# Patient Record
Sex: Male | Born: 1935 | Race: White | Hispanic: No | Marital: Married | Smoking: Never smoker
Health system: Southern US, Community
[De-identification: ages and names within clinical notes are randomized; demographics above are authoritative.]

## PROBLEM LIST (undated history)

## (undated) DIAGNOSIS — S72001A Fracture of unspecified part of neck of right femur, initial encounter for closed fracture: Secondary | ICD-10-CM

## (undated) DIAGNOSIS — M519 Unspecified thoracic, thoracolumbar and lumbosacral intervertebral disc disorder: Secondary | ICD-10-CM

## (undated) DIAGNOSIS — R55 Syncope and collapse: Secondary | ICD-10-CM

## (undated) DIAGNOSIS — E785 Hyperlipidemia, unspecified: Secondary | ICD-10-CM

## (undated) DIAGNOSIS — M1990 Unspecified osteoarthritis, unspecified site: Secondary | ICD-10-CM

## (undated) DIAGNOSIS — I4821 Permanent atrial fibrillation: Secondary | ICD-10-CM

## (undated) DIAGNOSIS — I119 Hypertensive heart disease without heart failure: Secondary | ICD-10-CM

## (undated) HISTORY — PX: TOTAL HIP ARTHROPLASTY: SHX124

## (undated) HISTORY — DX: Permanent atrial fibrillation: I48.21

## (undated) HISTORY — PX: ANKLE SURGERY: SHX546

## (undated) HISTORY — DX: Unspecified osteoarthritis, unspecified site: M19.90

## (undated) HISTORY — DX: Hyperlipidemia, unspecified: E78.5

## (undated) HISTORY — DX: Unspecified thoracic, thoracolumbar and lumbosacral intervertebral disc disorder: M51.9

---

## 2000-03-04 ENCOUNTER — Encounter (INDEPENDENT_AMBULATORY_CARE_PROVIDER_SITE_OTHER): Payer: Self-pay | Admitting: Specialist

## 2000-03-04 ENCOUNTER — Ambulatory Visit (HOSPITAL_COMMUNITY): Admission: RE | Admit: 2000-03-04 | Discharge: 2000-03-04 | Payer: Self-pay | Admitting: Gastroenterology

## 2003-10-03 ENCOUNTER — Ambulatory Visit (HOSPITAL_COMMUNITY): Admission: RE | Admit: 2003-10-03 | Discharge: 2003-10-03 | Payer: Self-pay | Admitting: Gastroenterology

## 2005-10-29 ENCOUNTER — Encounter: Payer: Self-pay | Admitting: Internal Medicine

## 2005-10-29 ENCOUNTER — Encounter: Admission: RE | Admit: 2005-10-29 | Discharge: 2005-10-29 | Payer: Self-pay | Admitting: Family Medicine

## 2006-08-27 ENCOUNTER — Encounter: Admission: RE | Admit: 2006-08-27 | Discharge: 2006-08-27 | Payer: Self-pay | Admitting: Family Medicine

## 2006-09-14 ENCOUNTER — Encounter: Admission: RE | Admit: 2006-09-14 | Discharge: 2006-09-14 | Payer: Self-pay | Admitting: Family Medicine

## 2007-02-25 ENCOUNTER — Encounter: Admission: RE | Admit: 2007-02-25 | Discharge: 2007-02-25 | Payer: Self-pay | Admitting: Family Medicine

## 2007-03-22 ENCOUNTER — Encounter: Admission: RE | Admit: 2007-03-22 | Discharge: 2007-03-22 | Payer: Self-pay | Admitting: Family Medicine

## 2007-05-26 ENCOUNTER — Ambulatory Visit: Payer: Self-pay | Admitting: Internal Medicine

## 2007-05-26 DIAGNOSIS — I10 Essential (primary) hypertension: Secondary | ICD-10-CM

## 2007-08-09 ENCOUNTER — Ambulatory Visit: Payer: Self-pay | Admitting: Internal Medicine

## 2007-08-09 DIAGNOSIS — M51379 Other intervertebral disc degeneration, lumbosacral region without mention of lumbar back pain or lower extremity pain: Secondary | ICD-10-CM | POA: Insufficient documentation

## 2007-08-09 DIAGNOSIS — M5137 Other intervertebral disc degeneration, lumbosacral region: Secondary | ICD-10-CM

## 2007-08-18 ENCOUNTER — Encounter: Admission: RE | Admit: 2007-08-18 | Discharge: 2007-10-12 | Payer: Self-pay | Admitting: Internal Medicine

## 2007-08-18 ENCOUNTER — Encounter: Payer: Self-pay | Admitting: Internal Medicine

## 2007-09-15 ENCOUNTER — Ambulatory Visit: Payer: Self-pay | Admitting: Internal Medicine

## 2007-10-12 ENCOUNTER — Encounter: Payer: Self-pay | Admitting: Internal Medicine

## 2007-11-24 ENCOUNTER — Ambulatory Visit: Payer: Self-pay | Admitting: Internal Medicine

## 2007-11-26 LAB — CONVERTED CEMR LAB
ALT: 33 units/L (ref 0–53)
BUN: 14 mg/dL (ref 6–23)
Basophils Absolute: 0.1 10*3/uL (ref 0.0–0.1)
Chloride: 106 meq/L (ref 96–112)
Cholesterol: 153 mg/dL (ref 0–200)
Creatinine, Ser: 0.8 mg/dL (ref 0.4–1.5)
Eosinophils Absolute: 0.2 10*3/uL (ref 0.0–0.7)
Eosinophils Relative: 2.5 % (ref 0.0–5.0)
Glucose, Bld: 134 mg/dL — ABNORMAL HIGH (ref 70–99)
HCT: 39.7 % (ref 39.0–52.0)
HDL: 36.7 mg/dL — ABNORMAL LOW (ref >39.0)
MCV: 92.9 fL (ref 78.0–100.0)
Monocytes Relative: 11.4 % (ref 3.0–12.0)
Potassium: 4.3 meq/L (ref 3.5–5.1)
RBC: 4.27 M/uL (ref 4.22–5.81)
TSH: 3.45 microintl units/mL (ref 0.35–5.50)
Total Bilirubin: 0.9 mg/dL (ref 0.3–1.2)
Total CHOL/HDL Ratio: 4.2
Total Protein: 7.2 g/dL (ref 6.0–8.3)
Triglycerides: 136 mg/dL (ref 0–149)
VLDL: 27 mg/dL (ref 0–40)
WBC: 9 10*3/uL (ref 4.5–10.5)

## 2008-01-06 ENCOUNTER — Encounter: Admission: RE | Admit: 2008-01-06 | Discharge: 2008-01-06 | Payer: Self-pay | Admitting: Neurosurgery

## 2008-01-14 ENCOUNTER — Encounter: Admission: RE | Admit: 2008-01-14 | Discharge: 2008-01-14 | Payer: Self-pay | Admitting: Neurosurgery

## 2008-01-19 ENCOUNTER — Encounter: Payer: Self-pay | Admitting: Internal Medicine

## 2008-01-19 ENCOUNTER — Telehealth: Payer: Self-pay | Admitting: Internal Medicine

## 2008-01-20 ENCOUNTER — Ambulatory Visit: Payer: Self-pay | Admitting: Internal Medicine

## 2008-01-20 DIAGNOSIS — M159 Polyosteoarthritis, unspecified: Secondary | ICD-10-CM

## 2008-01-27 ENCOUNTER — Ambulatory Visit: Payer: Self-pay

## 2008-01-27 ENCOUNTER — Encounter: Payer: Self-pay | Admitting: Internal Medicine

## 2008-01-28 ENCOUNTER — Telehealth (INDEPENDENT_AMBULATORY_CARE_PROVIDER_SITE_OTHER): Payer: Self-pay | Admitting: *Deleted

## 2008-02-01 ENCOUNTER — Inpatient Hospital Stay (HOSPITAL_COMMUNITY): Admission: RE | Admit: 2008-02-01 | Discharge: 2008-02-04 | Payer: Self-pay | Admitting: Orthopedic Surgery

## 2008-02-09 ENCOUNTER — Encounter: Payer: Self-pay | Admitting: Internal Medicine

## 2008-02-09 ENCOUNTER — Emergency Department (HOSPITAL_COMMUNITY): Admission: EM | Admit: 2008-02-09 | Discharge: 2008-02-10 | Payer: Self-pay | Admitting: Emergency Medicine

## 2008-02-10 ENCOUNTER — Telehealth: Payer: Self-pay | Admitting: Internal Medicine

## 2008-02-11 ENCOUNTER — Telehealth: Payer: Self-pay | Admitting: Internal Medicine

## 2008-02-11 ENCOUNTER — Encounter: Payer: Self-pay | Admitting: Internal Medicine

## 2008-02-23 ENCOUNTER — Encounter: Payer: Self-pay | Admitting: Internal Medicine

## 2008-04-05 ENCOUNTER — Encounter: Payer: Self-pay | Admitting: Internal Medicine

## 2008-05-26 ENCOUNTER — Ambulatory Visit: Payer: Self-pay | Admitting: Internal Medicine

## 2008-05-29 LAB — CONVERTED CEMR LAB
ALT: 13 units/L (ref 0–53)
AST: 19 units/L (ref 0–37)
Alkaline Phosphatase: 88 units/L (ref 39–117)
Bilirubin, Direct: 0.2 mg/dL (ref 0.0–0.3)
CO2: 29 meq/L (ref 19–32)
Cholesterol: 144 mg/dL (ref 0–200)
GFR calc Af Amer: 105 mL/min
GFR calc non Af Amer: 87 mL/min
Glucose, Bld: 95 mg/dL (ref 70–99)
Sodium: 139 meq/L (ref 135–145)
Total CHOL/HDL Ratio: 3.1
Triglycerides: 75 mg/dL (ref 0–149)
VLDL: 15 mg/dL (ref 0–40)

## 2008-10-05 ENCOUNTER — Telehealth: Payer: Self-pay | Admitting: Internal Medicine

## 2008-11-28 ENCOUNTER — Ambulatory Visit: Payer: Self-pay | Admitting: Internal Medicine

## 2008-11-29 LAB — CONVERTED CEMR LAB
Albumin: 4 g/dL (ref 3.5–5.2)
BUN: 20 mg/dL (ref 6–23)
Basophils Absolute: 0.1 10*3/uL (ref 0.0–0.1)
Bilirubin, Direct: 0 mg/dL (ref 0.0–0.3)
CO2: 29 meq/L (ref 19–32)
Cholesterol: 136 mg/dL (ref 0–200)
Creatinine, Ser: 1 mg/dL (ref 0.4–1.5)
Eosinophils Relative: 2.7 % (ref 0.0–5.0)
Glucose, Bld: 107 mg/dL — ABNORMAL HIGH (ref 70–99)
HDL: 42.9 mg/dL (ref >39.00)
MCHC: 33.9 g/dL (ref 30.0–36.0)
Monocytes Relative: 13.9 % — ABNORMAL HIGH (ref 3.0–12.0)
Neutro Abs: 3.7 10*3/uL (ref 1.4–7.7)
Neutrophils Relative %: 56.2 % (ref 43.0–77.0)
Phosphorus: 3.8 mg/dL (ref 2.3–4.6)
Platelets: 264 10*3/uL (ref 150.0–400.0)
Potassium: 4.2 meq/L (ref 3.5–5.1)
RBC: 4.23 M/uL (ref 4.22–5.81)
Sodium: 138 meq/L (ref 135–145)
TSH: 2.71 microintl units/mL (ref 0.35–5.50)
Triglycerides: 104 mg/dL (ref 0.0–149.0)
VLDL: 20.8 mg/dL (ref 0.0–40.0)
WBC: 6.6 10*3/uL (ref 4.5–10.5)

## 2009-06-05 ENCOUNTER — Ambulatory Visit: Payer: Self-pay | Admitting: Internal Medicine

## 2009-06-05 DIAGNOSIS — I080 Rheumatic disorders of both mitral and aortic valves: Secondary | ICD-10-CM

## 2009-09-21 ENCOUNTER — Telehealth: Payer: Self-pay | Admitting: Internal Medicine

## 2009-12-24 ENCOUNTER — Ambulatory Visit: Payer: Self-pay | Admitting: Internal Medicine

## 2009-12-27 LAB — CONVERTED CEMR LAB
BUN: 19 mg/dL (ref 6–23)
Bilirubin, Direct: 0.1 mg/dL (ref 0.0–0.3)
CO2: 27 meq/L (ref 19–32)
GFR calc non Af Amer: 54.96 mL/min (ref >60)
Glucose, Bld: 93 mg/dL (ref 70–99)
HCT: 41.3 % (ref 39.0–52.0)
HDL: 57.8 mg/dL (ref >39.00)
Lymphocytes Relative: 20.7 % (ref 12.0–46.0)
MCHC: 34.2 g/dL (ref 30.0–36.0)
Monocytes Relative: 11.9 % (ref 3.0–12.0)
Neutro Abs: 6 10*3/uL (ref 1.4–7.7)
Potassium: 4.8 meq/L (ref 3.5–5.1)
RBC: 4.29 M/uL (ref 4.22–5.81)
RDW: 13.9 % (ref 11.5–14.6)
TSH: 2.77 microintl units/mL (ref 0.35–5.50)
Total Bilirubin: 0.6 mg/dL (ref 0.3–1.2)
Total Protein: 7.5 g/dL (ref 6.0–8.3)
Triglycerides: 151 mg/dL — ABNORMAL HIGH (ref 0.0–149.0)

## 2010-03-08 ENCOUNTER — Telehealth: Payer: Self-pay | Admitting: Internal Medicine

## 2010-04-23 NOTE — Assessment & Plan Note (Signed)
Summary: 6 M F/U DLO   Vital Signs:  Patient profile:   77 year old male Weight:      191 pounds BMI:     27.12 Temp:     97.9 degrees F oral Pulse rate:   58 / minute Pulse rhythm:   regular BP sitting:   142 / 80  (left arm) Cuff size:   large  Vitals Entered By: Mervin Hack CMA Duncan Dull) (December 24, 2009 3:30 PM) CC: 6 month follow-up   History of Present Illness: Doing okay No new concerns  Arthritis is occ a problem If he overdoes it, he may have some pain only rarely uses advil Occ walks and does yard work Occ has right buttock pain at the start of walking, that will then resolve over time  Occ has friend check BP (or at BP) Usually  ~140/80 No chest pain No SOB No edema  Still taking statin no muscle aches  Allergies: No Known Drug Allergies  Past History:  Past medical, surgical, family and social histories (including risk factors) reviewed for relevance to current acute and chronic problems.  Past Medical History: Reviewed history from 01/20/2008 and no changes required. Hyperlipidemia Hypertension Lumbar disk disease Osteoarthritis  Past Surgical History: Reviewed history from 05/26/2008 and no changes required. 12/09  Left THR--[place]  Family History: Reviewed history from 05/26/2007 and no changes required. Dad killed in MVA @25  Mom died @52   some type of cancer 1 sister--high chol and HTN also No CAD, DM No prostate or colon cancer  Social History: Reviewed history from 05/26/2007 and no changes required. Retired--worked for [employer] Married--2 daughters Never Smoked Alcohol use-yes  Review of Systems       Occ gets slightly unstable feeling after just getting up no falls sleeps fine in general---occ middle of the night awakening (gets 6.5-7 hours per night in general) appetite is good weight is down 5#  Physical Exam  General:  alert and normal appearance.   Neck:  supple, no masses, no thyromegaly, no carotid bruits, and no  cervical lymphadenopathy.   Lungs:  normal respiratory effort, no intercostal retractions, no accessory muscle use, and normal breath sounds.   Heart:  normal rate, regular rhythm, no murmur, and no gallop.   Msk:  no joint tenderness and no joint swelling.   Extremities:  no edema Neurologic:  alert & oriented X3, strength normal in all extremities, and gait normal.   Psych:  normally interactive, good eye contact, not anxious appearing, and not depressed appearing.     Impression & Recommendations:  Problem # 1:  HYPERTENSION (ICD-401.9) Assessment Unchanged  good control no changes needed due for labs  His updated medication list for this problem includes:    Metoprolol Tartrate 100 Mg Tabs (Metoprolol tartrate) .Marland Kitchen... Take 1 tablet by mouth once a day    Quinapril-hydrochlorothiazide 20-12.5 Mg Tabs (Quinapril-hydrochlorothiazide) .Marland Kitchen... Take 1 tablet by mouth once a day  BP today: 142/80 Prior BP: 140/80 (06/05/2009)  Labs Reviewed: K+: 4.2 (11/28/2008) Creat: : 1.0 (11/28/2008)   Chol: 136 (11/28/2008)   HDL: 42.90 (11/28/2008)   LDL: 72 (11/28/2008)   TG: 104.0 (11/28/2008)  Orders: TLB-Renal Function Panel (80069-RENAL) TLB-CBC Platelet - w/Differential (85025-CBCD) TLB-TSH (Thyroid Stimulating Hormone) (84443-TSH) Venipuncture (59563)  Problem # 2:  OSTEOARTHRITIS (ICD-715.90) Assessment: Improved not needing meds much discussed staying fit with regular  walking  Problem # 3:  HYPERLIPIDEMIA (ICD-272.4) Assessment: Unchanged  discussed philosophy of primary prevention will continue  His updated  medication list for this problem includes:    Simvastatin 20 Mg Tabs (Simvastatin) .Marland Kitchen... Take 1 tablet by mouth once a day  Orders: TLB-Lipid Panel (80061-LIPID) TLB-Hepatic/Liver Function Pnl (80076-HEPATIC)  Complete Medication List: 1)  Metoprolol Tartrate 100 Mg Tabs (Metoprolol tartrate) .... Take 1 tablet by mouth once a day 2)  Quinapril-hydrochlorothiazide  20-12.5 Mg Tabs (Quinapril-hydrochlorothiazide) .... Take 1 tablet by mouth once a day 3)  Simvastatin 20 Mg Tabs (Simvastatin) .... Take 1 tablet by mouth once a day 4)  Amoxicillin 500 Mg Caps (Amoxicillin) .... Take 4 one hour prior to dental procedures 5)  Adult Aspirin Low Strength 81 Mg Tbdp (Aspirin) .... Take 1 tablet by mouth once a day  Other Orders: Flu Vaccine 74yrs + MEDICARE PATIENTS (E4540) Administration Flu vaccine - MCR (J8119)  Patient Instructions: 1)  Please schedule a follow-up appointment in 6 months .   Current Allergies (reviewed today): No known allergies    Influenza Vaccine    Vaccine Type: Fluvax MCR    Site: left deltoid    Mfr: GlaxoSmithKline    Dose: 0.5 ml    Route: IM    Given by: Mervin Hack CMA (AAMA)    Exp. Date: 09/21/2010    Lot #: [id number]    VIS given: 10/16/09 version given December 24, 2009.  Flu Vaccine Consent Questions    Do you have a history of severe allergic reactions to this vaccine? no    Any prior history of allergic reactions to egg and/or gelatin? no    Do you have a sensitivity to the preservative Thimersol? no    Do you have a past history of Guillan-Barre Syndrome? no    Do you currently have an acute febrile illness? no    Have you ever had a severe reaction to latex? no    Vaccine information given and explained to patient? yes

## 2010-04-23 NOTE — Assessment & Plan Note (Signed)
Summary: ROA 6 MTHS CYD   Vital Signs:  Patient profile:   77 year old male Weight:      196 pounds Temp:     97.7 degrees F oral Pulse rate:   68 / minute Pulse rhythm:   regular BP sitting:   140 / 80  (left arm) Cuff size:   large  Vitals Entered By: Mervin Hack CMA Duncan Dull) (June 05, 2009 9:16 AM) CC: 56month follow-up   History of Present Illness: Doing fairly well left hip is doing very well Right hip is okay uses advil sporadically (not every day) and that helps Able to get out some in garden May have some pain from past ruptured disc as well  Occ checks blood pressure at [facility] 140s/70s NO chest pain  No SOB no sig edema  No Gi problems no myalgias on the statin   Allergies: No Known Drug Allergies  Past History:  Past medical, surgical, family and social histories (including risk factors) reviewed for relevance to current acute and chronic problems.  Past Medical History: Reviewed history from 01/20/2008 and no changes required. Hyperlipidemia Hypertension Lumbar disk disease Osteoarthritis  Past Surgical History: Reviewed history from 05/26/2008 and no changes required. 12/09  Left THR--[place]  Family History: Reviewed history from 05/26/2007 and no changes required. Dad killed in MVA @25  Mom died @52   some type of cancer 1 sister--high chol and HTN also No CAD, DM No prostate or colon cancer  Social History: Reviewed history from 05/26/2007 and no changes required. Retired--worked for [employer] Married--2 daughters Never Smoked Alcohol use-yes  Review of Systems       occ mild dizziness--mostly if gets up quickly Got recall for colonoscopy---may be reasonable to do one more. He will consider sleeps okay in general Nocturia x 1 at most weight up 10#---this is his former baseline   Physical Exam  General:  alert and normal appearance.   Neck:  supple, no masses, no thyromegaly, no carotid bruits, and no cervical lymphadenopathy.     Lungs:  normal respiratory effort and normal breath sounds.   Heart:  normal rate, regular rhythm, and no gallop.   Gr 2/6 systolic murmur at apex (MR) Abdomen:  soft, non-tender, and no masses.   Extremities:  No edema Psych:  normally interactive, good eye contact, not anxious appearing, and not depressed appearing.     Impression & Recommendations:  Problem # 1:  MITRAL REGURGITATION (ICD-396.3) Assessment New new murmur to me at least sounds very mild no cardiac symptoms will just follow  His updated medication list for this problem includes:    Metoprolol Tartrate 100 Mg Tabs (Metoprolol tartrate) .Marland Kitchen... Take 1 tablet by mouth once a day    Adult Aspirin Low Strength 81 Mg Tbdp (Aspirin) .Marland Kitchen... Take 1 tablet by mouth once a day  Problem # 2:  HYPERTENSION (ICD-401.9) Assessment: Unchanged good control  no changes needed  His updated medication list for this problem includes:    Metoprolol Tartrate 100 Mg Tabs (Metoprolol tartrate) .Marland Kitchen... Take 1 tablet by mouth once a day    Quinapril-hydrochlorothiazide 20-12.5 Mg Tabs (Quinapril-hydrochlorothiazide) .Marland Kitchen... Take 1 tablet by mouth once a day  BP today: 140/80 Prior BP: 130/60 (11/28/2008)  Labs Reviewed: K+: 4.2 (11/28/2008) Creat: : 1.0 (11/28/2008)   Chol: 136 (11/28/2008)   HDL: 42.90 (11/28/2008)   LDL: 72 (11/28/2008)   TG: 104.0 (11/28/2008)  Problem # 3:  HYPERLIPIDEMIA (ICD-272.4) Assessment: Unchanged at goal recheck labs next time  His updated  medication list for this problem includes:    Simvastatin 20 Mg Tabs (Simvastatin) .Marland Kitchen... Take 1 tablet by mouth once a day  Labs Reviewed: SGOT: 18 (11/28/2008)   SGPT: 16 (11/28/2008)   HDL:42.90 (11/28/2008), 47.0 (05/26/2008)  LDL:72 (11/28/2008), 82 (05/26/2008)  Chol:136 (11/28/2008), 144 (05/26/2008)  Trig:104.0 (11/28/2008), 75 (05/26/2008)  Problem # 4:  OSTEOARTHRITIS (ICD-715.90) Assessment: Unchanged does okay with occ advil right hip not bothering him  as much now that left THR has healed  Complete Medication List: 1)  Metoprolol Tartrate 100 Mg Tabs (Metoprolol tartrate) .... Take 1 tablet by mouth once a day 2)  Adult Aspirin Low Strength 81 Mg Tbdp (Aspirin) .... Take 1 tablet by mouth once a day 3)  Quinapril-hydrochlorothiazide 20-12.5 Mg Tabs (Quinapril-hydrochlorothiazide) .... Take 1 tablet by mouth once a day 4)  Simvastatin 20 Mg Tabs (Simvastatin) .... Take 1 tablet by mouth once a day  Patient Instructions: 1)  Please schedule a follow-up appointment in 6 months .   Current Allergies (reviewed today): No known allergies

## 2010-04-23 NOTE — Progress Notes (Signed)
Summary: Amoxicillin for dental surgery  Phone Note Call from Patient Call back at Home Phone [phone] Call back at [phone]   Caller: Patient Call For: Cindee Salt MD Summary of Call: Needs Amoxicillin for Dental Surgery, at least 8 or possibly 12.  Erick Alley Initial call taken by: Delilah Shan CMA Duncan Dull),  September 21, 2009 10:54 AM  Follow-up for Phone Call        okay 500mg   #12 x 0 Take 4 tabs about 1 hour before dental procedures Follow-up by: Cindee Salt MD,  September 21, 2009 11:06 AM    New/Updated Medications: AMOXICILLIN 500 MG CAPS (AMOXICILLIN) take 4 one hour prior to dental procedures Prescriptions: AMOXICILLIN 500 MG CAPS (AMOXICILLIN) take 4 one hour prior to dental procedures  #12 x 0   Entered by:   Lowella Petties CMA   Authorized by:   Cindee Salt MD   Signed by:   Lowella Petties CMA on 09/21/2009   Method used:   Telephoned to ...       [facility] [address] [address] [facility]       [address]       [place], [place]  [address]       Ph: [phone]       Fax: [phone]   RxID:   [id number]   Prior Medications: METOPROLOL TARTRATE 100 MG  TABS (METOPROLOL TARTRATE) Take 1 tablet by mouth once a day ADULT ASPIRIN LOW STRENGTH 81 MG  TBDP (ASPIRIN) Take 1 tablet by mouth once a day QUINAPRIL-HYDROCHLOROTHIAZIDE 20-12.5 MG  TABS (QUINAPRIL-HYDROCHLOROTHIAZIDE) Take 1 tablet by mouth once a day SIMVASTATIN 20 MG  TABS (SIMVASTATIN) Take 1 tablet by mouth once a day Current Allergies: No known allergies

## 2010-04-25 NOTE — Progress Notes (Signed)
Summary: possible mini stroke   Phone Note Call from Patient Call back at Home Phone [phone]   Caller: Patient Call For: Cindee Salt MD Summary of Call: Patient's daughter called with concerns that she has for her dad. She says that a couple of weeks ago patient fell and blacked out for about 30 seconds, he told daughter and wife that he had vertigo and that is why he fell. She says that he also had some vomitting along with the dizzines for a couple of days. They tried to take him to the hospital because with the fall he got a little banged up with some minor bruises and scratches, but he refused to go to the hospital and told them that he was fine. Daughter says that since then he is having trouble with his memory and is very confused. 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 She is very concerned that he could have had a mini stroke. Patient refuses to come in and be checked out. Daugther wants [place] to send him a letter or either call him to ask him to make a follow up appt. without him knowing that she called [place]. Are we allowed to do that? Please advise.  Initial call taken by: Melody Comas,  March 08, 2010 1:32 PM  Follow-up for Phone Call        I will forward this to Dr. Alphonsus Sias as he knows the patient.  If he is refusing to be evaluated, this can wait for Dr. Alphonsus Sias to review on Monday. Ruthe Mannan MD  March 08, 2010 1:34 PM  He is not overdue for appt Only option is to ask him to come in based on family's concerns. I can certainly stress to him that they were right to be concerned. Call daughter back first and ask if we can call him in based on her concerns. It actually sounds like he had a completed stroke, not just a mini-one Cindee Salt MD  March 09, 2010 9:13 AM    spoke with wife, she  couldn't talk because pt was sitting right near her and listening to her call, I asked her to call me back when she could  talk, but she did express that pt will not come in. DeShannon Katrinka Blazing CMA Duncan Dull)  March 11, 2010 8:36 AM    Additional Follow-up for Phone Call Additional follow up Details #1::        Pt's daughter is asking that you call her.  She is not happy with the way the previous call was handled this morning.  Daughter wants pt to come in for a follow up but she wants [place] to call him and tell him.  She says he can absolutly NOT know that she has called or that we spoke to her mother this morning. Daughter's number is [phone] Additional Follow-up by: Lowella Petties CMA, AAMA,  March 11, 2010 9:27 AM    Additional Follow-up for Phone Call Additional follow up Details #2::    Had vertigo, then dizziness after Thanksgiving--this is better now Somewhat confused still per daughter She believes he stopped one of his BP meds Reviewed the record and no meds were stopped  Okay for her to let him know she called to check which med was stopped--and none were stopped. This may be a reason to get him back in the office she will be in town for another few days and watch  him closely in any case Follow-up by: Cindee Salt MD,  March 11, 2010 1:46 PM

## 2010-05-04 ENCOUNTER — Encounter: Payer: Self-pay | Admitting: Internal Medicine

## 2010-06-24 ENCOUNTER — Ambulatory Visit: Payer: Self-pay | Admitting: Internal Medicine

## 2010-06-25 ENCOUNTER — Encounter: Payer: Self-pay | Admitting: Internal Medicine

## 2010-06-25 ENCOUNTER — Ambulatory Visit (INDEPENDENT_AMBULATORY_CARE_PROVIDER_SITE_OTHER): Payer: 59 | Admitting: Internal Medicine

## 2010-06-25 VITALS — BP 108/62 | HR 70 | Temp 97.6°F | Ht 70.5 in | Wt 188.0 lb

## 2010-06-25 DIAGNOSIS — R42 Dizziness and giddiness: Secondary | ICD-10-CM | POA: Insufficient documentation

## 2010-06-25 DIAGNOSIS — E785 Hyperlipidemia, unspecified: Secondary | ICD-10-CM

## 2010-06-25 DIAGNOSIS — I10 Essential (primary) hypertension: Secondary | ICD-10-CM

## 2010-06-25 NOTE — Progress Notes (Signed)
  Subjective:    Patient ID: Martin Conner, male    DOB: 10-18-1927, 77 y.o.   MRN: [id number]  HPI DOing okay He stopped the cholesterol medication after the last visit--we had discussed this He hasn't noticed any changes Reviewed concerns about medication without mentioning daughter's call  Still has some "dizziness" when he rolls over to left side in bed or if he looks up Early vertigo symptoms Does report fall with bad spell after he drove all the way home from the beach and fell asleep with his head back Actually passed out briefly (this is probably the event his daughter called about)  NO chest pain No SOB No edema  Review of Systems Appetite is good Weight is stable Sleeps well    Objective:   Physical Exam  Constitutional: He is oriented to person, place, and time. He appears well-developed and well-nourished. No distress.  Neck: Normal range of motion. Neck supple. No thyromegaly present.  Cardiovascular: Normal rate, regular rhythm and normal heart sounds.  Exam reveals no gallop.   No murmur heard.      freq ectopy  Pulmonary/Chest: Effort normal and breath sounds normal. No respiratory distress. He has no wheezes. He has no rales.  Musculoskeletal: He exhibits no edema and no tenderness.  Lymphadenopathy:    He has no cervical adenopathy.  Neurological: He is alert and oriented to person, place, and time.       Presidents--"Barack Phillips [place], [place]" [phone] Recall   3/3 after 3 minutes Romberg absent No nystagmus  Psychiatric: He has a normal mood and affect. His behavior is normal. Judgment and thought content normal.          Assessment & Plan:

## 2010-08-06 NOTE — Op Note (Signed)
NAME:  Martin Conner, Martin Conner                   ACCOUNT NO.:  [id number]   MEDICAL RECORD NO.:  [id number]          PATIENT TYPE:  INP   LOCATION:  5040                         FACILITY:  [facility]   PHYSICIAN:  Eulas Post, MD    DATE OF BIRTH:  09-13-1927   DATE OF PROCEDURE:  02/01/2008  DATE OF DISCHARGE:                               OPERATIVE REPORT   ATTENDING PHYSICIAN:  Eulas Post, MD   ASSISTANT:  Laural Benes. Su Hilt, PA-C   PREOPERATIVE DIAGNOSIS:  Left hip osteoarthritis.   POSTOPERATIVE DIAGNOSIS:  Left hip osteoarthritis.   OPERATIVE PROCEDURE:  Left total hip arthroplasty.   ANESTHESIA:  General.   OPERATIVE IMPLANTS:  DePuy Pinnacle Marathon acetabular liner +4 ten  degree lip by 56 mm outer diameter and 36 mm inner diameter with a  Pinnacle sector to acetabular cup size 56 and a size 8 tapered hip stem  high offset with a size +5 x 36-mm metal-on-metal femoral head and a  single apex hole eliminator for the acetabulum.   PREOPERATIVE INDICATIONS:  Mr. Martin Conner is a 77 year old man who had  end-stage osteoarthritis of the left hip.  He elected to undergo the  above named procedures.  The risks, benefits, and alternatives were  discussed with him preoperatively including but not limited to risks of  infection, bleeding, nerve injury, periprosthetic fracture, dislocation,  leg length discrepancy, blood clots, a need for revision surgery,  cardiopulmonary complications, among others and he was willing to  proceed.   OPERATIVE PROCEDURE:  The patient was brought to the operating room and  placed in supine position.  General anesthesia was administered.  An 1  gm intravenous Ancef was given.  He was turned into lateral decubitus  position and the left lower extremity was prepped and draped in the  usual sterile fashion and posterior lateral approach was performed.  Capsulotomy was carried out in a T-type fashion and the piriformis was  tagged.  The hip was dislocated after  a measurement for our reference  was taken.  We performed a femoral neck resection at the adequate  position.  We then exposed the acetabulum and reamed with the  appropriate version and inclination up to prepare for a size 56.  The  overall bone quality was somewhat poor.  Nevertheless, he had excellent  fit with the trial and the acetabular component was seated flush against  the bone with excellent apposition and stability.  Therefore no screws  were placed.   We placed a lipped liner as the trial and then exposed the proximal  femur.  We had to lateralize fairly aggressively given his native  anatomy.  Therefore we used a high offset.  The bone quality overall was  mediocre.  We had lateralizing reamers and sequentially broached up to a  size 8.  This provided excellent fit.  Therefore, we placed the above  named trial broach and then reduced hip with first a standard neck and a  size 1.5 femoral head, but this was not found to restore appropriate leg  length, and the  high offset with the +5 did a better job.  Additionally,  the stability was excellent with this configuration.  Therefore, the  wounds were irrigated copiously and the real femoral stem was placed.  We trialed one more time and selected the above named components and  then impacted these into place.  We had already placed the polyethylene  component with the 10-degree lipped liner at the 2:30 position.  The  wounds were irrigated copiously and the hip reduced and it was found to  have excellent stability.  Therefore, we repaired the capsule and the  Piriformis through drill holes in the proximal greater tuberosity using  the smallest drill bit available.  Excellent soft tissue sleeve was  achieved.  We then irrigated the wounds again and closed the fascia with  Vicryl followed by Vicryl for the subcutaneous tissue and a 4-0 Monocryl  for the skin.  Steri-Strips were placed followed by Sterile gauze and an  abduction  brace.  The patient was awakened and returned to the PACU in  stable and satisfactory condition.  There were no complications.  The  patient tolerated the procedure well.      Eulas Post, MD  Electronically Signed     JPL/MEDQ  D:  02/01/2008  T:  02/02/2008  Job:  [id number]

## 2010-08-09 NOTE — Discharge Summary (Signed)
NAME:  Martin Conner, Martin Conner                   ACCOUNT NO.:  [id number]   MEDICAL RECORD NO.:  [id number]          PATIENT TYPE:  INP   LOCATION:  5040                         FACILITY:  [facility]   PHYSICIAN:  Eulas Post, MD    DATE OF BIRTH:  July 11, 1927   DATE OF ADMISSION:  02/01/2008  DATE OF DISCHARGE:  02/04/2008                               DISCHARGE SUMMARY   ADMISSION DIAGNOSIS:  Left hip osteoarthritis.   DISCHARGE DIAGNOSES:  1. Left hip osteoarthritis.  2. Urinary tract infection.   Discharge medications include Coumadin as well as ciprofloxacin and  Percocet and Valium as needed.   HOSPITAL COURSE:  Mr. Martin Conner is a 77 year old man who elected to  undergo left total hip arthroplasty.  He did well postoperatively and  was given Coumadin and sequential compression devices for DVT  prophylaxis.  He was making good progress with physical therapy and was  planned to be discharged home with followup with me in approximately 2  weeks.  His wounds were clean, dry, and intact, and his dressings were  changed prior discharge.  His pain was controlled on p.o. analgesics.  He was given Coumadin for DVT prophylaxis.  He was given Ancef for  perioperative antimicrobial prophylaxis.  He did well throughout his  hospital stay and benefited maximally.  There were no complications.      Eulas Post, MD  Electronically Signed     JPL/MEDQ  D:  03/06/2008  T:  03/07/2008  Job:  [id number]

## 2010-08-09 NOTE — Op Note (Signed)
NAME:  Martin Conner, Martin Conner                              ACCOUNT NO.:  [id number]   MEDICAL RECORD NO.:  [id number]                   PATIENT TYPE:  AMB   LOCATION:  ENDO                                 FACILITY:  [facility]   PHYSICIAN:  Bernette Redbird, M.D.                DATE OF BIRTH:  [birth year]   DATE OF PROCEDURE:  10/03/2003  DATE OF DISCHARGE:                                 OPERATIVE REPORT   PROCEDURE:  Colonoscopy.   INDICATIONS FOR PROCEDURE:  Followup of prior colonic adenomas, with a total  of approximately four previous colonoscopies over the past 16 years, with  the most recent colonoscopy 3 1/2 years ago having shown two small colonic  adenomas.   FINDINGS:  Extensive diverticulosis, otherwise normal exam.   DESCRIPTION OF PROCEDURE:  The nature, purpose and risk of the procedure  were familiar to the patient from prior examination and he provided written  consent. Sedation was fentanyl 60 mcg and Versed 6 mg IV without arrhythmias  or desaturation. The digital exam of the prostate was normal. The Olympus  adult video colonoscope was advanced without significant difficulty around  the colon to the proximal ascending colon whereupon some external abdominal  compression was used to get the tip of the scope to enter the cecum as  identified by clear visualization of the appendiceal orifice. The terminal  ileum was entered for a short distance and appeared normal. Pullback was  then performed. The quality of the prep was excellent and it is felt that  all areas were well seen.   This was a normal examination except for extensive diverticulosis  predominantly in the left colon and sigmoid region. No polyps, cancer,  colitis, or vascular malformations were noted. Retroflexion, by  recollection, was unremarkable. No biopsies were obtained. The patient  tolerated the procedure well and there were no apparent complications.   IMPRESSION:  1. Prior history of colonic adenomas, without  current polyps being     identified (V12.72).  2. Extensive diverticulosis.   PLAN:  Followup colonoscopy in five years.                                               Bernette Redbird, M.D.    RB/MEDQ  D:  10/03/2003  T:  10/03/2003  Job:  [id number]   cc:   Donia Guiles, M.D.  [address] [place]  [place] [address]  Fax: [phone]

## 2010-08-09 NOTE — Procedures (Signed)
[facility]  Patient:    Martin Conner, Martin Conner                           MRN: [id number] Proc. Date: 03/04/00 Adm. Date:  [id number] Attending:  Rich Brave CC:         Desma Maxim, M.D.   Procedure Report  PROCEDURE:  Colonoscopy with biopsies.  INDICATION:  A 77 year old gentleman, status post removal of adenomatous polyp approximately 11 years ago, with follow-up colonoscopies in 1991 and 1996 having been negative except for diverticulosis.  FINDINGS:  Two small sessile polyps, removed.  Pancolonic diverticulosis.  DESCRIPTION OF PROCEDURE:  The nature, purpose, and risks of the procedure were familiar to the patient, who provided written consent.  Sedation was fentanyl 50 mcg and Versed 5 mg IV without arrhythmias or desaturation. Digital exam of the prostate was unremarkable.  The Olympus adult video colonoscope was advanced without too much difficulty through a slightly fixated sigmoid region, putting the patient in the supine position and also using some abdominal compression to facilitate advancement into the base of the cecum, and pullback was then performed.  There was a 2-3 mm sessile polyp in the cecum, removed by a single cold biopsy, and another similar polyp in the ascending colon, also removed by cold biopsy technique.  No large polyps were seen, and there was no evidence of colitis, vascular malformations, or cancer.  The patient had extensive pancolonic diverticulosis.  Retroflexion in the rectum was normal.  IMPRESSION: 1. Two diminutive colonic polyps, removed as described above. 2. Pancolonic diverticulosis.  PLAN:  Await pathology on the polyps.  Would anticipate colonoscopic follow-up in three to five years depending on histologic findings. DD:  03/04/00 TD:  03/04/00 Job: [id number]

## 2010-08-19 ENCOUNTER — Other Ambulatory Visit: Payer: Self-pay | Admitting: Internal Medicine

## 2010-09-17 ENCOUNTER — Encounter: Payer: Self-pay | Admitting: Family Medicine

## 2010-09-17 ENCOUNTER — Ambulatory Visit (INDEPENDENT_AMBULATORY_CARE_PROVIDER_SITE_OTHER): Payer: 59 | Admitting: Family Medicine

## 2010-09-17 VITALS — BP 130/80 | HR 68 | Temp 98.0°F | Resp 20 | Ht 70.5 in | Wt 189.0 lb

## 2010-09-17 DIAGNOSIS — J40 Bronchitis, not specified as acute or chronic: Secondary | ICD-10-CM

## 2010-09-17 MED ORDER — AMOXICILLIN 500 MG PO TABS: ORAL_TABLET | ORAL | Status: DC

## 2010-09-17 NOTE — Progress Notes (Signed)
  Subjective:    Patient ID: Martin Conner, male    DOB: Jul 25, 1927, 77 y.o.   MRN: [id number]  HPI CC: cough, ?bronchitis  3wk h/o cough.  Cough productive of clear sputum.  Initially with fevers, not anymore.  + congestion in chest.  Using mucinex DM and advil.  Denies fevers/chills, abd pain, n/v/d, new reashes, myalgia, arthralgia, HA, sinus congestion.  Denies ear pain or tooth pain.  Wife sick as well, also improving.  No smokers at home, no h/o asthma, COPD.  Pt unsure if he's taking metoprolol xl or regular, but states takes daily.  also requests amox script for prior to dental procedures.  States takes since has had total hip replacement L.  Review of Systems Per HPI    Objective:   Physical Exam  Nursing note and vitals reviewed. Constitutional: He appears well-developed and well-nourished. No distress.  HENT:  Head: Normocephalic and atraumatic.  Right Ear: Hearing, tympanic membrane, external ear and ear canal normal.  Left Ear: Hearing, tympanic membrane, external ear and ear canal normal.  Nose: Nose normal. No mucosal edema or rhinorrhea.  Mouth/Throat: Uvula is midline, oropharynx is clear and moist and mucous membranes are normal. No oropharyngeal exudate, posterior oropharyngeal edema, posterior oropharyngeal erythema or tonsillar abscesses.  Eyes: Conjunctivae and EOM are normal. Pupils are equal, round, and reactive to light. No scleral icterus.  Neck: Normal range of motion. Neck supple. No thyromegaly present.  Cardiovascular: Normal rate, regular rhythm, normal heart sounds and intact distal pulses.   No murmur heard. Pulmonary/Chest: Effort normal and breath sounds normal. No respiratory distress. He has no wheezes. He has no rales.  Lymphadenopathy:    He has no cervical adenopathy.  Skin: Skin is warm and dry. No rash noted.          Assessment & Plan:

## 2010-09-17 NOTE — Assessment & Plan Note (Addendum)
Likely viral.  Seems to be improving on its own. Recommend supportive care per instructions. Update if not improving as expected.  Also provided with amox script for 2gm prior to dental procedures per pt request.  Added L THR to surgical hx.

## 2010-09-17 NOTE — Patient Instructions (Addendum)
I think you had bronchitis, seems to be getting better on it's own. I don't think you need antibiotic for now. Continue mucinex DM as well as push fluids and plenty of rest. If fever >101, worsening cough, shortness of breath or not improving as expected, let us know.

## 2010-12-24 LAB — CBC
HCT: 28 — ABNORMAL LOW
HCT: 29.8 — ABNORMAL LOW
HCT: 31.4 — ABNORMAL LOW
Hemoglobin: 11 — ABNORMAL LOW
MCHC: 33.7
MCV: 93.2
MCV: 93.3
MCV: 94.4
MCV: 94.7
MCV: 95.3
Platelets: 255
Platelets: 588 — ABNORMAL HIGH
RBC: 3.01 — ABNORMAL LOW
RBC: 3.46 — ABNORMAL LOW
RBC: 4.52
RDW: 13.6
WBC: 10
WBC: 11.2 — ABNORMAL HIGH
WBC: 11.6 — ABNORMAL HIGH
WBC: 16.8 — ABNORMAL HIGH

## 2010-12-24 LAB — BASIC METABOLIC PANEL
BUN: 22
BUN: 29 — ABNORMAL HIGH
CO2: 27
CO2: 28
Calcium: 8.7
Calcium: 9.7
Chloride: 94 — ABNORMAL LOW
Chloride: 94 — ABNORMAL LOW
Chloride: 99
Creatinine, Ser: 0.91
Creatinine, Ser: 1.34
GFR calc Af Amer: >60
GFR calc Af Amer: >60
Glucose, Bld: 106 — ABNORMAL HIGH
Potassium: 3.9
Potassium: 4
Sodium: 129 — ABNORMAL LOW

## 2010-12-24 LAB — URINALYSIS, ROUTINE W REFLEX MICROSCOPIC
Bilirubin Urine: NEGATIVE
Ketones, ur: NEGATIVE
Nitrite: NEGATIVE
Protein, ur: NEGATIVE
Protein, ur: NEGATIVE
Specific Gravity, Urine: 1.014
Urobilinogen, UA: 0.2
Urobilinogen, UA: 1

## 2010-12-24 LAB — ABO/RH: ABO/RH(D): A POS

## 2010-12-24 LAB — COMPREHENSIVE METABOLIC PANEL
AST: 25
Alkaline Phosphatase: 48
BUN: 26 — ABNORMAL HIGH
CO2: 28
Chloride: 100
Creatinine, Ser: 1.2
GFR calc non Af Amer: 59 — ABNORMAL LOW
Potassium: 4.2
Total Bilirubin: 0.5

## 2010-12-24 LAB — URINE CULTURE
Colony Count: 3000
Special Requests: NEGATIVE

## 2010-12-24 LAB — DIFFERENTIAL
Basophils Absolute: 0.1
Basophils Relative: 1
Eosinophils Absolute: 0.2
Eosinophils Relative: 2
Lymphs Abs: 2.2
Neutrophils Relative %: 66

## 2010-12-24 LAB — APTT: aPTT: 27

## 2010-12-24 LAB — PROTIME-INR
INR: 1.3
INR: 3.5 — ABNORMAL HIGH
INR: 4 — ABNORMAL HIGH
Prothrombin Time: 37.6 — ABNORMAL HIGH

## 2010-12-24 LAB — TYPE AND SCREEN: ABO/RH(D): A POS

## 2010-12-24 LAB — OCCULT BLOOD X 1 CARD TO LAB, STOOL: Fecal Occult Bld: POSITIVE

## 2010-12-25 ENCOUNTER — Ambulatory Visit (INDEPENDENT_AMBULATORY_CARE_PROVIDER_SITE_OTHER): Payer: 59 | Admitting: Internal Medicine

## 2010-12-25 ENCOUNTER — Encounter: Payer: Self-pay | Admitting: Internal Medicine

## 2010-12-25 VITALS — BP 119/53 | HR 70 | Temp 97.5°F | Ht 70.0 in | Wt 187.0 lb

## 2010-12-25 DIAGNOSIS — M5137 Other intervertebral disc degeneration, lumbosacral region: Secondary | ICD-10-CM

## 2010-12-25 DIAGNOSIS — I10 Essential (primary) hypertension: Secondary | ICD-10-CM

## 2010-12-25 DIAGNOSIS — Z23 Encounter for immunization: Secondary | ICD-10-CM

## 2010-12-25 DIAGNOSIS — E785 Hyperlipidemia, unspecified: Secondary | ICD-10-CM

## 2010-12-25 LAB — HEPATIC FUNCTION PANEL
AST: 20 U/L (ref 0–37)
Albumin: 4.3 g/dL (ref 3.5–5.2)
Alkaline Phosphatase: 61 U/L (ref 39–117)
Total Protein: 8.1 g/dL (ref 6.0–8.3)

## 2010-12-25 LAB — BASIC METABOLIC PANEL
BUN: 23 mg/dL (ref 6–23)
CO2: 27 mEq/L (ref 19–32)
Chloride: 102 mEq/L (ref 96–112)
GFR: 62.34 mL/min (ref >60.00)
Glucose, Bld: 110 mg/dL — ABNORMAL HIGH (ref 70–99)
Potassium: 4.4 mEq/L (ref 3.5–5.1)
Sodium: 138 mEq/L (ref 135–145)

## 2010-12-25 LAB — CBC WITH DIFFERENTIAL/PLATELET
Basophils Absolute: 0.1 10*3/uL (ref 0.0–0.1)
HCT: 47.3 % (ref 39.0–52.0)
Hemoglobin: 15.6 g/dL (ref 13.0–17.0)
Lymphs Abs: 2 10*3/uL (ref 0.7–4.0)
MCV: 95.6 fl (ref 78.0–100.0)
Monocytes Absolute: 1.1 10*3/uL — ABNORMAL HIGH (ref 0.1–1.0)
Monocytes Relative: 14.9 % — ABNORMAL HIGH (ref 3.0–12.0)
Neutro Abs: 3.9 10*3/uL (ref 1.4–7.7)
Platelets: 281 10*3/uL (ref 150.0–400.0)
RDW: 15.6 % — ABNORMAL HIGH (ref 11.5–14.6)

## 2010-12-25 LAB — TSH: TSH: 2.75 u[IU]/mL (ref 0.35–5.50)

## 2010-12-25 LAB — LIPID PANEL: HDL: 46.5 mg/dL (ref >39.00)

## 2010-12-25 LAB — LDL CHOLESTEROL, DIRECT: Direct LDL: 146.2 mg/dL

## 2010-12-25 NOTE — Assessment & Plan Note (Signed)
Doing better Hasn't needed meds

## 2010-12-25 NOTE — Progress Notes (Signed)
  Subjective:    Patient ID: Martin Conner, male    DOB: [birth year], 77 y.o.   MRN: [id number]  HPI Doing well  Has been inconsistent with the quinapril---last 2 days ago and 6/14 days about Still off the statin Always has taken lopressor daily only  Dizziness is better No chest pain or SOB No edema  Doesn't check BP  occ pain in ankle---past fracture on right No sig back problems  Current Outpatient Prescriptions on File Prior to Visit  Medication Sig Dispense Refill  . amoxicillin (AMOXIL) 500 MG tablet Take 4 tablets prior to dental procedure  4 tablet  1  . aspirin 81 MG tablet Take 81 mg by mouth daily.        Marland Kitchen ibuprofen (ADVIL,MOTRIN) 200 MG tablet Take 200 mg by mouth every 6 (six) hours as needed.        . metoprolol (LOPRESSOR) 100 MG tablet TAKE 1 TABLET DAILY  90 tablet  2  . quinapril-hydrochlorothiazide (ACCURETIC) 20-12.5 MG per tablet Take 1 tablet by mouth daily.         No Known Allergies  Past Medical History  Diagnosis Date  . Hyperlipidemia   . Hypertension   . Lumbar disc disease   . Osteoarthritis     Past Surgical History  Procedure Date  . Total hip arthroplasty     left    Family History  Problem Relation Age of Onset  . Cancer Mother   . Hyperlipidemia Sister   . Hypertension Sister     History   Social History  . Marital Status: Married    Spouse Name: N/A    Number of Children: 2  . Years of Education: N/A   Occupational History  . retired  At [employer]   Social History Main Topics  . Smoking status: Never Smoker   . Smokeless tobacco: Not on file  . Alcohol Use: Yes  . Drug Use: Not on file  . Sexually Active: Not on file   Other Topics Concern  . Not on file   Social History Narrative   Dad killed in MVA @25    Review of Systems Appetite is excellent Weight is stable Sleeps well     Objective:   Physical Exam  Constitutional: He appears well-developed and well-nourished. No distress.  Neck: Normal range of  motion. Neck supple. No thyromegaly present.  Cardiovascular: Normal rate, regular rhythm and normal heart sounds.  Exam reveals no gallop.   No murmur heard.      Faint pulse in left foot, 1+ in right  Pulmonary/Chest: Effort normal and breath sounds normal. No respiratory distress. He has no wheezes. He has no rales.  Abdominal: Soft. There is no tenderness.  Musculoskeletal: Normal range of motion. He exhibits no edema and no tenderness.  Lymphadenopathy:    He has no cervical adenopathy.  Psychiatric: He has a normal mood and affect. His behavior is normal. Judgment and thought content normal.          Assessment & Plan:

## 2010-12-25 NOTE — Assessment & Plan Note (Signed)
Off the statin Will check but probably stay off regardless

## 2010-12-25 NOTE — Patient Instructions (Signed)
Please check your blood pressure once or twice a month Call if it is regularly over 140/90

## 2010-12-25 NOTE — Assessment & Plan Note (Signed)
BP Readings from Last 3 Encounters:  12/25/10 119/53  09/17/10 130/80  06/25/10 108/62   BP low despite not regular with quinapril Will stop and have him check If high, will restart Daily lopressor not optimal, but seems to be okay for him

## 2011-04-17 ENCOUNTER — Telehealth: Payer: Self-pay

## 2011-04-17 ENCOUNTER — Ambulatory Visit (INDEPENDENT_AMBULATORY_CARE_PROVIDER_SITE_OTHER): Payer: 59 | Admitting: Internal Medicine

## 2011-04-17 VITALS — BP 143/88 | HR 76

## 2011-04-17 DIAGNOSIS — I10 Essential (primary) hypertension: Secondary | ICD-10-CM

## 2011-04-17 NOTE — Telephone Encounter (Signed)
Pts wife left v/m pts BP is 180/80. Wants call back from [place]. I tried to call [phone] to get more information and did not get answer and no v/m.

## 2011-04-17 NOTE — Progress Notes (Signed)
  Subjective:    Patient ID: Martin Conner, male    DOB: December 26, 1927, 78 y.o.   MRN: [id number]  HPI  Here to check BP See phone note also  Review of Systems     Objective:   Physical Exam        Assessment & Plan:

## 2011-04-17 NOTE — Telephone Encounter (Signed)
Spoke with patient and advised results   

## 2011-04-17 NOTE — Telephone Encounter (Signed)
Patient walked in to have BP checked, BP was 143/88 left arm sitting p 76 O2 97, advised pt I would send note to [facility] and would call him if anything needs to be changed. Pt checked BP at [facility] and it was 180/80.

## 2011-04-17 NOTE — Telephone Encounter (Signed)
His BP has been fine here I would not change anything

## 2011-04-24 ENCOUNTER — Other Ambulatory Visit: Payer: Self-pay | Admitting: Internal Medicine

## 2011-06-25 ENCOUNTER — Ambulatory Visit (INDEPENDENT_AMBULATORY_CARE_PROVIDER_SITE_OTHER): Payer: 59 | Admitting: Internal Medicine

## 2011-06-25 ENCOUNTER — Encounter: Payer: Self-pay | Admitting: Internal Medicine

## 2011-06-25 VITALS — BP 136/84 | HR 60 | Temp 98.0°F | Ht 70.0 in | Wt 193.0 lb

## 2011-06-25 DIAGNOSIS — E785 Hyperlipidemia, unspecified: Secondary | ICD-10-CM

## 2011-06-25 DIAGNOSIS — I10 Essential (primary) hypertension: Secondary | ICD-10-CM

## 2011-06-25 DIAGNOSIS — M1990 Unspecified osteoarthritis, unspecified site: Secondary | ICD-10-CM

## 2011-06-25 NOTE — Progress Notes (Signed)
  Subjective:    Patient ID: Martin Conner, male    DOB: 1927-10-11, 78 y.o.   MRN: [id number]  HPI Doing well No new concerns Doesn't generally check BP  No regular exercise Walks occ No chest pain No SOB No edema No dizziness or syncope  Discussed chol again Wants to defer decision about meds til the next testing is done  No sig back pain Does use advil in day or occ at night if he was restless the night before  Current Outpatient Prescriptions on File Prior to Visit  Medication Sig Dispense Refill  . aspirin 81 MG tablet Take 81 mg by mouth daily.        Marland Kitchen ibuprofen (ADVIL,MOTRIN) 200 MG tablet Take 200 mg by mouth every 6 (six) hours as needed.        Marland Kitchen DISCONTD: metoprolol (LOPRESSOR) 100 MG tablet TAKE 1 TABLET DAILY  90 tablet  3    No Known Allergies  Past Medical History  Diagnosis Date  . Hyperlipidemia   . Hypertension   . Lumbar disc disease   . Osteoarthritis     Past Surgical History  Procedure Date  . Total hip arthroplasty     left    Family History  Problem Relation Age of Onset  . Cancer Mother   . Hyperlipidemia Sister   . Hypertension Sister     History   Social History  . Marital Status: Married    Spouse Name: N/A    Number of Children: 2  . Years of Education: N/A   Occupational History  . retired  [employer]   Social History Main Topics  . Smoking status: Never Smoker   . Smokeless tobacco: Not on file  . Alcohol Use: Yes  . Drug Use: Not on file  . Sexually Active: Not on file   Other Topics Concern  . Not on file   Social History Narrative   No living willRequests that wife make decisions for him if neededNot sure about DNR but probably leans towards thisWould not want tube feeds if cognitively unaware   Review of Systems Gets intermittent distal left knee pain Sleeps okay most of the time---unless he gets things on his mind Appetite okay    Objective:   Physical Exam  Constitutional: He appears well-developed and  well-nourished. No distress.  Neck: Normal range of motion. Neck supple. No thyromegaly present.  Cardiovascular: Normal rate, regular rhythm, normal heart sounds and intact distal pulses.  Exam reveals no gallop.   No murmur heard. Pulmonary/Chest: Effort normal. No respiratory distress. He has no wheezes. He has no rales.  Musculoskeletal: He exhibits no edema and no tenderness.  Lymphadenopathy:    He has no cervical adenopathy.  Skin:       Mycotic toenails but no ulcers  Psychiatric: He has a normal mood and affect. His behavior is normal. Thought content normal.          Assessment & Plan:

## 2011-06-25 NOTE — Assessment & Plan Note (Signed)
BP Readings from Last 3 Encounters:  06/25/11 136/84  04/17/11 143/88  12/25/10 119/53   Good control Will have him take 1/2 bid instead of daily

## 2011-06-25 NOTE — Assessment & Plan Note (Signed)
Discussed primary prevention at his age Not excited about this and neither am I Discussed lifestyle measures

## 2011-06-25 NOTE — Assessment & Plan Note (Signed)
occ mild knee and back pain Does fine with only occ ibuprofen

## 2011-12-24 ENCOUNTER — Other Ambulatory Visit: Payer: Self-pay | Admitting: *Deleted

## 2011-12-24 MED ORDER — AMOXICILLIN 500 MG PO TABS: ORAL_TABLET | ORAL | Status: DC

## 2012-01-30 ENCOUNTER — Ambulatory Visit (INDEPENDENT_AMBULATORY_CARE_PROVIDER_SITE_OTHER): Payer: 59 | Admitting: Internal Medicine

## 2012-01-30 ENCOUNTER — Encounter: Payer: Self-pay | Admitting: Internal Medicine

## 2012-01-30 VITALS — BP 160/90 | HR 68 | Temp 98.0°F | Ht 70.0 in | Wt 187.0 lb

## 2012-01-30 DIAGNOSIS — Z0000 Encounter for general adult medical examination without abnormal findings: Secondary | ICD-10-CM | POA: Insufficient documentation

## 2012-01-30 DIAGNOSIS — I080 Rheumatic disorders of both mitral and aortic valves: Secondary | ICD-10-CM

## 2012-01-30 DIAGNOSIS — I10 Essential (primary) hypertension: Secondary | ICD-10-CM

## 2012-01-30 DIAGNOSIS — E785 Hyperlipidemia, unspecified: Secondary | ICD-10-CM

## 2012-01-30 LAB — BASIC METABOLIC PANEL
BUN: 13 mg/dL (ref 6–23)
CO2: 30 mEq/L (ref 19–32)
Glucose, Bld: 93 mg/dL (ref 70–99)
Potassium: 5 mEq/L (ref 3.5–5.1)
Sodium: 137 mEq/L (ref 135–145)

## 2012-01-30 LAB — HEPATIC FUNCTION PANEL
Albumin: 4.3 g/dL (ref 3.5–5.2)
Total Protein: 8.4 g/dL — ABNORMAL HIGH (ref 6.0–8.3)

## 2012-01-30 LAB — CBC WITH DIFFERENTIAL/PLATELET
Basophils Absolute: 0.1 10*3/uL (ref 0.0–0.1)
Eosinophils Absolute: 0.1 10*3/uL (ref 0.0–0.7)
HCT: 51.2 % (ref 39.0–52.0)
Hemoglobin: 16.8 g/dL (ref 13.0–17.0)
Lymphs Abs: 1.7 10*3/uL (ref 0.7–4.0)
MCHC: 32.8 g/dL (ref 30.0–36.0)
Monocytes Absolute: 0.9 10*3/uL (ref 0.1–1.0)
Neutro Abs: 4.1 10*3/uL (ref 1.4–7.7)
Platelets: 291 10*3/uL (ref 150.0–400.0)
RDW: 14.6 % (ref 11.5–14.6)

## 2012-01-30 LAB — LIPID PANEL: Triglycerides: 110 mg/dL (ref 0.0–149.0)

## 2012-01-30 LAB — LDL CHOLESTEROL, DIRECT: Direct LDL: 154.1 mg/dL

## 2012-01-30 LAB — TSH: TSH: 2.47 u[IU]/mL (ref 0.35–5.50)

## 2012-01-30 NOTE — Assessment & Plan Note (Signed)
Again discussed primary prevention Will recheck but no meds

## 2012-01-30 NOTE — Assessment & Plan Note (Signed)
BP Readings from Last 3 Encounters:  01/30/12 160/90  06/25/11 136/84  04/17/11 143/88   Has generally been okay No changes for now Repeat 154/100 on right

## 2012-01-30 NOTE — Assessment & Plan Note (Signed)
Generally healthy No cancer screening appropriate Rx given for zostavax Flu vaccine today

## 2012-01-30 NOTE — Assessment & Plan Note (Signed)
Murmur not heard today 

## 2012-01-30 NOTE — Progress Notes (Signed)
Subjective:    Patient ID: Martin Conner, male    DOB: 1927/05/06, 78 y.o.   MRN: [id number]  HPI Here for physical Reviewed advanced directives Not really exercising No falls or instability No depression or anhedonia  Continues on the BP med Feels the dizziness he used to have went away off the statin Has cut down on sweets Weight is down 6# since last visit Current Outpatient Prescriptions on File Prior to Visit  Medication Sig Dispense Refill  . aspirin 81 MG tablet Take 81 mg by mouth daily.        . metoprolol (LOPRESSOR) 100 MG tablet         No Known Allergies  Past Medical History  Diagnosis Date  . Hyperlipidemia   . Hypertension   . Lumbar disc disease   . Osteoarthritis     Past Surgical History  Procedure Date  . Total hip arthroplasty     left    Family History  Problem Relation Age of Onset  . Cancer Mother   . Hyperlipidemia Sister   . Hypertension Sister     History   Social History  . Marital Status: Married    Spouse Name: N/A    Number of Children: 2  . Years of Education: N/A   Occupational History  . retired  [employer]   Social History Main Topics  . Smoking status: Never Smoker   . Smokeless tobacco: Never Used  . Alcohol Use: Yes  . Drug Use: No  . Sexually Active: Not on file   Other Topics Concern  . Not on file   Social History Narrative   No living willRequests that wife make decisions for him if neededWould accept resuscitation but no prolonged artificial ventilationWould not want tube feeds if cognitively unaware   Review of Systems  Constitutional: Negative for fatigue and unexpected weight change.       Wears seat belt  HENT: Positive for rhinorrhea. Negative for hearing loss, dental problem and tinnitus.        Keeps up with dentist Some AM nasal symptoms--no meds  Eyes: Negative for visual disturbance.       Vision stable Has some troubles with fine print without his best glasses  Respiratory: Positive for  shortness of breath. Negative for cough and chest tightness.        Does note some DOE--but not overly different over many years  Cardiovascular: Negative for chest pain, palpitations and leg swelling.  Gastrointestinal: Negative for nausea, vomiting, abdominal pain, constipation and blood in stool.       No heartburn  Genitourinary: Positive for difficulty urinating. Negative for urgency and frequency.       Nocturia x 1 generally--slow flow occasionally  Musculoskeletal: Positive for arthralgias. Negative for back pain and joint swelling.       Occ hip pain---with prolonged sitting on mower  Skin: Negative for rash.       No suspicious lesions  Neurological: Negative for dizziness, syncope, weakness, light-headedness, numbness and headaches.  Hematological: Negative for adenopathy. Bruises/bleeds easily.       Easy bruising on aspirin  Psychiatric/Behavioral: Negative for sleep disturbance and dysphoric mood. The patient is not nervous/anxious.        Objective:   Physical Exam  Constitutional: He is oriented to person, place, and time. He appears well-developed and well-nourished. No distress.  HENT:  Head: Normocephalic and atraumatic.  Right Ear: External ear normal.  Left Ear: External ear normal.  Mouth/Throat: Oropharynx is clear and moist. No oropharyngeal exudate.  Eyes: Conjunctivae normal and EOM are normal. Pupils are equal, round, and reactive to light.  Neck: Normal range of motion. Neck supple. No thyromegaly present.  Cardiovascular: Normal rate, regular rhythm, normal heart sounds and intact distal pulses.  Exam reveals no gallop.   No murmur heard.      Good pulse on right, faint on left  Pulmonary/Chest: Effort normal and breath sounds normal. No respiratory distress. He has no wheezes. He has no rales.  Abdominal: Soft. There is no tenderness.  Musculoskeletal: He exhibits no edema and no tenderness.  Lymphadenopathy:    He has no cervical adenopathy.    Neurological: He is alert and oriented to person, place, and time.  Skin:       Mycotic toenails Mild redness and scaling along plantar feet  Psychiatric: He has a normal mood and affect. His behavior is normal.          Assessment & Plan:

## 2012-02-03 ENCOUNTER — Encounter: Payer: Self-pay | Admitting: *Deleted

## 2012-03-25 ENCOUNTER — Other Ambulatory Visit: Payer: Self-pay | Admitting: Internal Medicine

## 2012-07-19 ENCOUNTER — Telehealth: Payer: Self-pay | Admitting: Internal Medicine

## 2012-07-19 NOTE — Telephone Encounter (Signed)
Call from Dr Skip Estimable at [facility] Patient had fall during dizzy spell Dizziness appeared benign (vasovagal) but he suffered tib/fib fracture that will need open reduction He has been offered surgery there but he wants to come home Suggested he call [facility] to arrange transfer there since I can't do that directly

## 2012-07-20 ENCOUNTER — Inpatient Hospital Stay (HOSPITAL_COMMUNITY)
Admission: EM | Admit: 2012-07-20 | Discharge: 2012-07-23 | DRG: 493 | Disposition: A | Discharge status: Skilled Nursing Facility | Payer: Medicare Other | Attending: Internal Medicine | Admitting: Internal Medicine

## 2012-07-20 ENCOUNTER — Encounter (HOSPITAL_COMMUNITY): Payer: Self-pay | Admitting: Emergency Medicine

## 2012-07-20 DIAGNOSIS — M5137 Other intervertebral disc degeneration, lumbosacral region: Secondary | ICD-10-CM

## 2012-07-20 DIAGNOSIS — R17 Unspecified jaundice: Secondary | ICD-10-CM | POA: Diagnosis present

## 2012-07-20 DIAGNOSIS — M1990 Unspecified osteoarthritis, unspecified site: Secondary | ICD-10-CM

## 2012-07-20 DIAGNOSIS — W010XXA Fall on same level from slipping, tripping and stumbling without subsequent striking against object, initial encounter: Secondary | ICD-10-CM | POA: Diagnosis present

## 2012-07-20 DIAGNOSIS — I080 Rheumatic disorders of both mitral and aortic valves: Secondary | ICD-10-CM

## 2012-07-20 DIAGNOSIS — M159 Polyosteoarthritis, unspecified: Secondary | ICD-10-CM | POA: Diagnosis present

## 2012-07-20 DIAGNOSIS — S82201A Unspecified fracture of shaft of right tibia, initial encounter for closed fracture: Secondary | ICD-10-CM

## 2012-07-20 DIAGNOSIS — R42 Dizziness and giddiness: Secondary | ICD-10-CM

## 2012-07-20 DIAGNOSIS — Z789 Other specified health status: Secondary | ICD-10-CM | POA: Diagnosis present

## 2012-07-20 DIAGNOSIS — I10 Essential (primary) hypertension: Secondary | ICD-10-CM | POA: Diagnosis present

## 2012-07-20 DIAGNOSIS — Z0000 Encounter for general adult medical examination without abnormal findings: Secondary | ICD-10-CM

## 2012-07-20 DIAGNOSIS — E785 Hyperlipidemia, unspecified: Secondary | ICD-10-CM | POA: Diagnosis present

## 2012-07-20 DIAGNOSIS — Y92009 Unspecified place in unspecified non-institutional (private) residence as the place of occurrence of the external cause: Secondary | ICD-10-CM

## 2012-07-20 DIAGNOSIS — S82209A Unspecified fracture of shaft of unspecified tibia, initial encounter for closed fracture: Principal | ICD-10-CM

## 2012-07-20 DIAGNOSIS — S82409A Unspecified fracture of shaft of unspecified fibula, initial encounter for closed fracture: Secondary | ICD-10-CM

## 2012-07-20 DIAGNOSIS — F1090 Alcohol use, unspecified, uncomplicated: Secondary | ICD-10-CM | POA: Diagnosis present

## 2012-07-20 DIAGNOSIS — R509 Fever, unspecified: Secondary | ICD-10-CM | POA: Diagnosis not present

## 2012-07-20 DIAGNOSIS — I4891 Unspecified atrial fibrillation: Secondary | ICD-10-CM | POA: Diagnosis present

## 2012-07-20 DIAGNOSIS — Z96649 Presence of unspecified artificial hip joint: Secondary | ICD-10-CM

## 2012-07-20 DIAGNOSIS — F1010 Alcohol abuse, uncomplicated: Secondary | ICD-10-CM

## 2012-07-20 DIAGNOSIS — S82201D Unspecified fracture of shaft of right tibia, subsequent encounter for closed fracture with routine healing: Secondary | ICD-10-CM

## 2012-07-20 DIAGNOSIS — R55 Syncope and collapse: Secondary | ICD-10-CM | POA: Diagnosis not present

## 2012-07-20 LAB — BASIC METABOLIC PANEL
BUN: 15 mg/dL (ref 6–23)
Chloride: 100 mEq/L (ref 96–112)
Creatinine, Ser: 0.87 mg/dL (ref 0.50–1.35)
GFR calc Af Amer: >90 mL/min (ref >90)
Glucose, Bld: 87 mg/dL (ref 70–99)
Potassium: 4.2 mEq/L (ref 3.5–5.1)

## 2012-07-20 LAB — CBC WITH DIFFERENTIAL/PLATELET
Basophils Relative: 1 % (ref 0–1)
Eosinophils Absolute: 0.3 10*3/uL (ref 0.0–0.7)
HCT: 41.8 % (ref 39.0–52.0)
Hemoglobin: 14.4 g/dL (ref 13.0–17.0)
Lymphs Abs: 1.3 10*3/uL (ref 0.7–4.0)
MCH: 31.6 pg (ref 26.0–34.0)
MCHC: 34.4 g/dL (ref 30.0–36.0)
Monocytes Absolute: 1.8 10*3/uL — ABNORMAL HIGH (ref 0.1–1.0)
Monocytes Relative: 14 % — ABNORMAL HIGH (ref 3–12)
Neutro Abs: 8.9 10*3/uL — ABNORMAL HIGH (ref 1.7–7.7)
Neutrophils Relative %: 73 % (ref 43–77)
RBC: 4.56 MIL/uL (ref 4.22–5.81)

## 2012-07-20 LAB — URINALYSIS, ROUTINE W REFLEX MICROSCOPIC
Leukocytes, UA: NEGATIVE
Nitrite: NEGATIVE
Specific Gravity, Urine: 1.025 (ref 1.005–1.030)
pH: 6 (ref 5.0–8.0)

## 2012-07-20 LAB — URINE MICROSCOPIC-ADD ON

## 2012-07-20 LAB — PROTIME-INR: INR: 1.08 (ref 0.00–1.49)

## 2012-07-20 LAB — HEPATIC FUNCTION PANEL
AST: 19 U/L (ref 0–37)
Albumin: 3.4 g/dL — ABNORMAL LOW (ref 3.5–5.2)

## 2012-07-20 MED ORDER — ONDANSETRON HCL 4 MG PO TABS: 4.0000 mg | ORAL_TABLET | Freq: Four times a day (QID) | ORAL | Status: DC | PRN

## 2012-07-20 MED ORDER — ACETAMINOPHEN 325 MG PO TABS
650.0000 mg | ORAL_TABLET | Freq: Four times a day (QID) | ORAL | Status: DC | PRN
  Administered 2012-07-20 – 2012-07-21 (×2): 650 mg via ORAL
  Filled 2012-07-20 (×2): qty 2

## 2012-07-20 MED ORDER — HYDRALAZINE HCL 20 MG/ML IJ SOLN
5.0000 mg | Freq: Four times a day (QID) | INTRAMUSCULAR | Status: DC | PRN
  Filled 2012-07-20: qty 0.25

## 2012-07-20 MED ORDER — ONDANSETRON HCL 4 MG/2ML IJ SOLN: 4.0000 mg | Freq: Four times a day (QID) | INTRAMUSCULAR | Status: DC | PRN

## 2012-07-20 MED ORDER — OXYCODONE HCL 5 MG PO TABS: 5.0000 mg | ORAL_TABLET | ORAL | Status: DC | PRN

## 2012-07-20 MED ORDER — SODIUM CHLORIDE 0.9 % IJ SOLN: 3.0000 mL | Freq: Two times a day (BID) | INTRAMUSCULAR | Status: DC

## 2012-07-20 MED ORDER — LORAZEPAM 2 MG/ML IJ SOLN: 2.0000 mg | Freq: Four times a day (QID) | INTRAMUSCULAR | Status: DC | PRN

## 2012-07-20 MED ORDER — SODIUM CHLORIDE 0.9 % IJ SOLN
3.0000 mL | Freq: Two times a day (BID) | INTRAMUSCULAR | Status: DC
  Administered 2012-07-21: 3 mL via INTRAVENOUS

## 2012-07-20 MED ORDER — ACETAMINOPHEN 650 MG RE SUPP
650.0000 mg | Freq: Four times a day (QID) | RECTAL | Status: DC | PRN
Start: 2012-07-20 — End: 2012-07-21

## 2012-07-20 MED ORDER — SODIUM CHLORIDE 0.9 % IJ SOLN: 3.0000 mL | INTRAMUSCULAR | Status: DC | PRN

## 2012-07-20 MED ORDER — METOPROLOL TARTRATE 1 MG/ML IV SOLN
5.0000 mg | Freq: Four times a day (QID) | INTRAVENOUS | Status: DC
  Administered 2012-07-20 – 2012-07-21 (×3): 5 mg via INTRAVENOUS
  Filled 2012-07-20 (×7): qty 5

## 2012-07-20 MED ORDER — SODIUM CHLORIDE 0.9 % IV SOLN: 250.0000 mL | INTRAVENOUS | Status: DC | PRN

## 2012-07-20 MED ORDER — SODIUM CHLORIDE 0.9 % IV SOLN
INTRAVENOUS | Status: AC
  Administered 2012-07-20: 18:00:00 via INTRAVENOUS

## 2012-07-20 NOTE — ED Provider Notes (Signed)
History     CSN: [id number]  Arrival date & time 07/20/12  1259   First MD Initiated Contact with Patient 07/20/12 1321      No chief complaint on file.  Tibia fracture (Consider location/radiation/quality/duration/timing/severity/associated sxs/prior treatment) HPI Patient fell yesterday and fractured his right tibia and fibula.  Patient states he had syncopal episode.  He was at his home in [place] and got dizzy after walking.  Patient states he got very nauseated and passed out injuring his right lower leg.  He denies other injury including head.  He was seen at hospital there and had ct head, x Royelle Hinchman of chest  And right tib fib and ankle with right tibia and fibula fracture.  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  Patient with history of atrial fib, not on anticoagulation but rate controlled with lopressor.  Patient wanted to come back to [place] from [place].  He was discharged to home last night. Wife drove him here and requests evaluation for tibia fracture.  He had a splint placed yesterday. He states he has chronic left knee pain and is unable to walk with with crutches.  He denies other injury, chest pain, fever, repeat syncope.   Past Medical History  Diagnosis Date  . Hyperlipidemia   . Hypertension   . Lumbar disc disease   . Osteoarthritis     Past Surgical History  Procedure Laterality Date  . Total hip arthroplasty      left    Family History  Problem Relation Age of Onset  . Cancer Mother   . Hyperlipidemia Sister   . Hypertension Sister     History  Substance Use Topics  . Smoking status: Never Smoker   . Smokeless tobacco: Never Used  . Alcohol Use: Yes      Review of Systems  All other systems reviewed and are negative.    Allergies  Review of patient's allergies indicates no known allergies.  Home Medications   Current Outpatient Rx  Name  Route  Sig  Dispense  Refill  . aspirin 81 MG tablet  Oral   Take 81 mg by mouth daily.           . metoprolol (LOPRESSOR) 100 MG tablet   Oral   Take 100 mg by mouth 2 (two) times daily.           BP 178/85  Pulse 77  Temp(Src) 98 F (36.7 C) (Oral)  Resp 18  SpO2 98%  Physical Exam  Nursing note and vitals reviewed. Constitutional: He is oriented to person, place, and time. He appears well-developed and well-nourished.  HENT:  Head: Normocephalic and atraumatic.  Right Ear: External ear normal.  Left Ear: External ear normal.  Nose: Nose normal.  Mouth/Throat: Oropharynx is clear and moist.  Eyes: Conjunctivae and EOM are normal. Pupils are equal, round, and reactive to light.  Neck: Normal range of motion. Neck supple.  Cardiovascular:  Irregularly irregular  Pulmonary/Chest: Effort normal and breath sounds normal.  Abdominal: Soft. Bowel sounds are normal.  Musculoskeletal:  Sugar tong splint in place rle.  Toes pink, cap refill intact.  Splint removed.  Skin intact moderate ttp distal lower leg.  Hip nontender.    Neurological: He is alert and oriented to person, place, and time.  Skin: Skin is warm and dry.  Psychiatric: He has a normal mood and affect. His behavior is normal. Judgment and thought content normal.  ED Course  Procedures (including critical care time)  Labs Reviewed  CBC WITH DIFFERENTIAL  BASIC METABOLIC PANEL  CBC WITH DIFFERENTIAL  TROPONIN I  CBC WITH DIFFERENTIAL  BASIC METABOLIC PANEL  TROPONIN I   No results found.   No diagnosis found.   Date: 07/20/2012  Rate: 66  Rhythm: atrial fibrillation  QRS Axis: normal  Intervals: normal  ST/T Wave abnormalities: normal  Conduction Disutrbances:t wave inversion v4 - v6  Narrative Interpretation:   Old EKG Reviewed: none available    Radiographs from outlying ed reviewed and distal tiba and fibula fx noted.  EKG here c.w. Atrial fib.  Patient with hip previously repaired by Dr. Dion Saucier.   Discussed with Dr. Dion Saucier and he asks  patient be admitted to hospitalist at [facility].     Hospitalist paged.   Discussed with patient and family.    Hilario Quarry, MD 07/21/12 [phone]

## 2012-07-20 NOTE — ED Notes (Addendum)
Pt here via ems  S/p fx tib/fib of rt leg while on vacation pt became dizzy and" black"out and fell. Went to local hospital splint was placed but pt refuse further care stated he wanted  to be treated in [place] were is lives

## 2012-07-20 NOTE — Consult Note (Signed)
ORTHOPAEDIC CONSULTATION  REQUESTING PHYSICIAN: Hollice Espy, MD  Chief Complaint: Right tibia fracture  HPI: Martin Conner is a 79 y.o. male who complains of  right tibia fracture, which occurred about a day ago, as she was walking, and had a syncopal event, after not eating breakfast. He had acute onset severe pain, seen at an outside emergency room, and ultimately was discharged, and presented back to the [place] [facility] emergency room and requested my care. I have done a left total hip replacement on him in the past. Pain is rated as moderate, but he is not capable of walking and has left-sided knee arthritis which limits him. The pain is located over his right leg and ankle, as well as his left knee. He had no skin breaks or bleeding at the time of the injury. He is being admitted to the medical service, with orthopedic consultation for his tibia.  Past Medical History  Diagnosis Date  . Hyperlipidemia   . Hypertension   . Lumbar disc disease   . Osteoarthritis    Past Surgical History  Procedure Laterality Date  . Total hip arthroplasty      left  . Ankle surgery      right ankle   History   Social History  . Marital Status: Married    Spouse Name: N/A    Number of Children: 2  . Years of Education: N/A   Occupational History  . retired  At [employer]   Social History Main Topics  . Smoking status: Never Smoker   . Smokeless tobacco: Never Used  . Alcohol Use: Yes     Comment: 3 or 4 alcoholic beverages per week  . Drug Use: No  . Sexually Active: None   Other Topics Concern  . None   Social History Narrative   No living will   Requests that wife make decisions for him if needed   Would accept resuscitation but no prolonged artificial ventilation   Would not want tube feeds if cognitively unaware   Family History  Problem Relation Age of Onset  . Cancer Mother   . Hyperlipidemia Sister   . Hypertension Sister    No Known Allergies Prior to Admission  medications   Medication Sig Start Date End Date Taking? Authorizing Provider  aspirin 81 MG tablet Take 81 mg by mouth daily.     Yes Historical Provider, MD  metoprolol (LOPRESSOR) 100 MG tablet Take 100 mg by mouth 2 (two) times daily.   Yes Historical Provider, MD   No results found.  Positive ROS: All other systems have been reviewed and were otherwise negative with the exception of those mentioned in the HPI and as above.  Physical Exam: General: Alert, no acute distress Cardiovascular: No pedal edema, and there is only minimal swelling over the fracture site. Respiratory: No cyanosis, no use of accessory musculature GI: No organomegaly, abdomen is soft and non-tender Skin: No lesions in the area of chief complaint, with the exception of some slight bruising. Neurologic: Sensation intact distally, although she does not seem to be in as much pain as I would expect, and may have some element of peripheral neuropathy. Psychiatric: Patient is competent for consent with normal mood and affect Lymphatic: No axillary or cervical lymphadenopathy  MUSCULOSKELETAL: Right leg has gross deformity with external rotation and slight angulation of the distal tibia. His compartments are soft. EHL and FHL are firing.  Assessment: Right distal tibia and fibula fracture, as seen on  outside films. The fibula is minimally displaced, but the tibia has significant displacement. He also has coexisting risk factors including his atrial fibrillation, hypertension, and medical issues as listed above, including the fact that this was a syncopal event.  Plan: He is going to be admitted to the medical service, management of his multiple comorbidities, including a syncope workup, and plan for intramedullary nail fixation for his tibia. I'm going to reapply a splint today in the [place] [facility] emergency room, and plan for surgical intervention probably tomorrow. I will transfer him to our trauma Center, at [facility], for  definitive treatment of his tibia.  The risks benefits and alternatives were discussed with the patient including but not limited to the risks of nonoperative treatment, versus surgical intervention including infection, bleeding, nerve injury, malunion, nonunion, the need for revision surgery, hardware prominence, hardware failure, the need for hardware removal, blood clots, cardiopulmonary complications, morbidity, mortality, among others, and they were willing to proceed.    I have coordinated with Dr. Virginia Rochester, and appreciate the input of the [facility] hospital[facility] service.    Eulas Post, MD Cell [phone]   07/20/2012 6:10 PM

## 2012-07-20 NOTE — ED Notes (Addendum)
Family contact information  [name] Barnhard/daughter [phone] Britta Mccreedy Eklund/wife [phone]

## 2012-07-20 NOTE — H&P (Signed)
[facility] History and Physical  Martin Conner OZH:[id number] DOB: 05-24-1927 DOA: 07/20/2012  Referring physician: Margarita Conner, ER physician PCP: Martin Abide, MD  Specialists: [facility]  Chief Complaint: Passed out and broke leg  HPI: Martin Conner is a 79 y.o. male  Past medical history of hypertension and atrial fibrillation-rate controlled but not anticoagulation who was at the beach yesterday and was in his usual state of health. He stated he took a new cold/allergy medicine that morning and forgot to eat breakfast. He then went for a long walk on the beach with his wife. He said the weather was somewhat warm. When he came back he started feeling somewhat lightheaded and felt like he was going to throw up. He walked up a few stairs to his beach house and then pulled himself back and the next thing he knew, he passed out. Wife states that he was out for maybe a few minutes. When he woke up he had fallen and hit his right lower extremity was hurting quite a bit. EMS was called and patient was sent to the hospital day her. Patient was evaluated and found to have a tear/fib fracture. The indicated that they wanted to have surgery done by the orthopedist in [place]. Patient was splinted. It was suspected that likely he had vasovagal syncope. No lab work is available from the facility, although films were sent over by disc. He likely received IV fluids there. After coming home, he was advised to go to the ER by his PCP. Lab work here was essentially unremarkable except for mild leukocytosis.  Review of Systems: I saw the patient done in the emergency room, he was doing okay. He denied any headaches, vision changes, dysphasia, chest pain, palpitations, shortness of breath, wheeze, cough, abdominal pain, hematuria, dysuria, constipation, diarrhea, focal extremity numbness or weakness or pain other than some pain in his right lower extremity with tenderness. His review of systems is  otherwise negative.  Past Medical History  Diagnosis Date  . Hyperlipidemia   . Hypertension   . Lumbar disc disease   . Osteoarthritis    Past Surgical History  Procedure Laterality Date  . Total hip arthroplasty      left  . Ankle surgery      right ankle   Social History:  reports that he has never smoked. He has never used smokeless tobacco. He reports that  drinks alcohol. He reports that he does not use illicit drugs. Normally patient ambulates and is quite active without any limitations. He is able to participate in all activities of daily living without assistance. He lives at home with his wife.  No Known Allergies  Family History  Problem Relation Age of Onset  . Cancer Mother   . Hyperlipidemia Sister   . Hypertension Sister     Prior to Admission medications   Medication Sig Start Date End Date Taking? Authorizing Provider  aspirin 81 MG tablet Take 81 mg by mouth daily.     Yes Historical Provider, MD  metoprolol (LOPRESSOR) 100 MG tablet Take 100 mg by mouth 2 (two) times daily.   Yes Historical Provider, MD   Physical Exam: Filed Vitals:   07/20/12 1314 07/20/12 1645  BP: 178/85 174/90  Pulse: 77   Temp: 98 F (36.7 C) 98.6 F (37 C)  TempSrc: Oral Oral  Resp: 18 21  SpO2: 98% 100%     General:  Alert and oriented x3, no acute distress, looks younger than stated age  Eyes: Sclera nonicteric, extraocular movements are intact  ENT: Normocephalic, atraumatic, mucous membranes are slightly dry.   Neck: Supple no JVD  Cardiovascular: Soft, irregular rhythm, rate controlled  Respiratory: Clear to auscultation bilaterally  Abdomen: Soft, nontender, nondistended, positive bowel sounds  Skin: He has minimal signs of hypervascularity to the left of his nasal fold.  Musculoskeletal: No clubbing or cyanosis, trace pitting edema bilaterally from the knees down. Did not manipulate his lower extremities secondary to pain  Psychiatric: Patient is  appropriate, no evidence of psychoses  Neurologic: No overt focal deficits  Labs on Admission:  Basic Metabolic Panel:  Recent Labs Lab 07/20/12 1510  NA 137  K 4.2  CL 100  CO2 26  GLUCOSE 87  BUN 15  CREATININE 0.87  CALCIUM 9.5   CBC:  Recent Labs Lab 07/20/12 1510  WBC 12.3*  NEUTROABS 8.9*  HGB 14.4  HCT 41.8  MCV 91.7  PLT 220   Cardiac Enzymes:  Recent Labs Lab 07/20/12 1510  TROPONINI <0.30    Radiological Exams on Admission: No results found.  EKG: Independently reviewed. Atrial fibrillation-rate controlled  Assessment/Plan Principal Problem:   Tibia/fibula fracture-patient will be transferred to [facility] so that he will be able to get the surgery scheduled tomorrow. I have spoken to orthopedic surgery and patient should be okay to proceed. Active Problems:   HYPERLIPIDEMIA: History of. As per doctor's advice, discontinued statin.    HYPERTENSION: Elevated secondary to less than ideal control plus pain. Treat with scheduled IV Lopressor plus when necessary IV hydralazine until patient is postop.    OSTEOARTHRITIS   A-fib: Not on anticoagulation. Rate controlled with by mouth metoprolol. Will use IV Lopressor until he is post surgery.    Syncope: Patient describes history where he took a new cold medicine, missed breakfast and then was walking several miles on the beach in warm weather. This could have been dehydration/vasovagal/mild hypoglycemia. Doubt very much that this is cardiac related. We'll confirm by checking 2-D echo and keeping patient on telemetry.    Heavy alcohol consumption: Patient states that he has 2-3 drinks 3 or 4 times a week and occasionally will drink more than that once a week. His wife and daughter do think he does drink somewhat excessively. He's not had any problems of alcohol withdrawal before. However, his wife puts it, she's not seeing him go and extended period of time without a drink. We'll keep an eye on  closely and have ordered when necessary IV Ativan at low dose for now.  Leukocytosis: Mild. Normal differential. Likely stress margination. Check urinalysis to be sure.  Code Status: Full code  Family Communication: Plan discussed with patient's wife and daughter who at the bedside.  Disposition Plan: May need short-term skilled nursing versus home with physical therapy in a few days  Time spent: 35 minutes  Hollice Espy [facility] Pager [phone]  If 7PM-7AM, please contact night-coverage [email or web address] Password [facility] 07/20/2012, 5:54 PM

## 2012-07-21 ENCOUNTER — Inpatient Hospital Stay (HOSPITAL_COMMUNITY): Payer: Medicare Other

## 2012-07-21 ENCOUNTER — Encounter (HOSPITAL_COMMUNITY): Payer: Self-pay | Admitting: Certified Registered Nurse Anesthetist

## 2012-07-21 ENCOUNTER — Encounter (HOSPITAL_COMMUNITY)
Admission: EM | Disposition: A | Discharge status: Skilled Nursing Facility | Payer: Self-pay | Source: Home / Self Care | Attending: Internal Medicine

## 2012-07-21 ENCOUNTER — Inpatient Hospital Stay (HOSPITAL_COMMUNITY): Payer: Medicare Other | Admitting: Certified Registered Nurse Anesthetist

## 2012-07-21 DIAGNOSIS — R509 Fever, unspecified: Secondary | ICD-10-CM

## 2012-07-21 DIAGNOSIS — S8290XD Unspecified fracture of unspecified lower leg, subsequent encounter for closed fracture with routine healing: Secondary | ICD-10-CM

## 2012-07-21 HISTORY — PX: TIBIA IM NAIL INSERTION: SHX2516

## 2012-07-21 LAB — CBC
Hemoglobin: 13.1 g/dL (ref 13.0–17.0)
MCV: 91.2 fL (ref 78.0–100.0)
Platelets: 171 10*3/uL (ref 150–400)
RBC: 4.1 MIL/uL — ABNORMAL LOW (ref 4.22–5.81)
WBC: 12 10*3/uL — ABNORMAL HIGH (ref 4.0–10.5)

## 2012-07-21 LAB — URINALYSIS, ROUTINE W REFLEX MICROSCOPIC
Bilirubin Urine: NEGATIVE
Ketones, ur: 40 mg/dL — AB
Nitrite: NEGATIVE
Urobilinogen, UA: 2 mg/dL — ABNORMAL HIGH (ref 0.0–1.0)

## 2012-07-21 LAB — TROPONIN I: Troponin I: <0.3 ng/mL (ref <0.30)

## 2012-07-21 LAB — CREATININE, SERUM: GFR calc Af Amer: >90 mL/min (ref >90)

## 2012-07-21 SURGERY — INSERTION, INTRAMEDULLARY ROD, TIBIA
Anesthesia: General | Site: Leg Lower | Laterality: Right | Wound class: Clean

## 2012-07-21 MED ORDER — ACETAMINOPHEN 325 MG PO TABS: 650.0000 mg | ORAL_TABLET | Freq: Four times a day (QID) | ORAL | Status: DC | PRN

## 2012-07-21 MED ORDER — METOCLOPRAMIDE HCL 5 MG PO TABS
5.0000 mg | ORAL_TABLET | Freq: Three times a day (TID) | ORAL | Status: DC | PRN
  Filled 2012-07-21: qty 2

## 2012-07-21 MED ORDER — LIDOCAINE HCL (CARDIAC) 20 MG/ML IV SOLN
INTRAVENOUS | Status: DC | PRN
  Administered 2012-07-21: 100 mg via INTRAVENOUS

## 2012-07-21 MED ORDER — OXYCODONE HCL 5 MG PO TABS: 5.0000 mg | ORAL_TABLET | Freq: Once | ORAL | Status: DC | PRN

## 2012-07-21 MED ORDER — MAGNESIUM CITRATE PO SOLN
1.0000 | Freq: Once | ORAL | Status: AC | PRN
  Filled 2012-07-21: qty 296

## 2012-07-21 MED ORDER — LORAZEPAM 2 MG/ML IJ SOLN
1.0000 mg | Freq: Four times a day (QID) | INTRAMUSCULAR | Status: DC | PRN
  Administered 2012-07-22: 1 mg via INTRAVENOUS
  Filled 2012-07-21: qty 1

## 2012-07-21 MED ORDER — OXYCODONE HCL 5 MG/5ML PO SOLN: 5.0000 mg | Freq: Once | ORAL | Status: DC | PRN

## 2012-07-21 MED ORDER — ONDANSETRON HCL 4 MG/2ML IJ SOLN: 4.0000 mg | Freq: Four times a day (QID) | INTRAMUSCULAR | Status: DC | PRN

## 2012-07-21 MED ORDER — PROMETHAZINE HCL 25 MG/ML IJ SOLN
6.2500 mg | INTRAMUSCULAR | Status: DC | PRN
  Filled 2012-07-21: qty 1

## 2012-07-21 MED ORDER — LACTATED RINGERS IV SOLN
INTRAVENOUS | Status: DC | PRN
  Administered 2012-07-21 (×2): via INTRAVENOUS

## 2012-07-21 MED ORDER — HYDROMORPHONE HCL PF 1 MG/ML IJ SOLN: 0.2500 mg | INTRAMUSCULAR | Status: DC | PRN

## 2012-07-21 MED ORDER — ROCURONIUM BROMIDE 100 MG/10ML IV SOLN
INTRAVENOUS | Status: DC | PRN
  Administered 2012-07-21: 50 mg via INTRAVENOUS

## 2012-07-21 MED ORDER — METOPROLOL TARTRATE 100 MG PO TABS
100.0000 mg | ORAL_TABLET | Freq: Two times a day (BID) | ORAL | Status: DC
  Administered 2012-07-21 – 2012-07-23 (×5): 100 mg via ORAL
  Filled 2012-07-21 (×6): qty 1

## 2012-07-21 MED ORDER — ENOXAPARIN SODIUM 40 MG/0.4ML ~~LOC~~ SOLN
40.0000 mg | SUBCUTANEOUS | Status: DC
  Administered 2012-07-22 – 2012-07-23 (×2): 40 mg via SUBCUTANEOUS
  Filled 2012-07-21 (×3): qty 0.4

## 2012-07-21 MED ORDER — GLYCOPYRROLATE 0.2 MG/ML IJ SOLN
INTRAMUSCULAR | Status: DC | PRN
Start: 2012-07-21 — End: 2012-07-21
  Administered 2012-07-21: .6 mg via INTRAVENOUS

## 2012-07-21 MED ORDER — POLYETHYLENE GLYCOL 3350 17 G PO PACK
17.0000 g | PACK | Freq: Every day | ORAL | Status: DC | PRN
  Filled 2012-07-21: qty 1

## 2012-07-21 MED ORDER — CEFAZOLIN SODIUM-DEXTROSE 2-3 GM-% IV SOLR
2.0000 g | Freq: Four times a day (QID) | INTRAVENOUS | Status: AC
  Administered 2012-07-22 (×2): 2 g via INTRAVENOUS
  Filled 2012-07-21 (×2): qty 50

## 2012-07-21 MED ORDER — ACETAMINOPHEN 650 MG RE SUPP: 650.0000 mg | Freq: Four times a day (QID) | RECTAL | Status: DC | PRN

## 2012-07-21 MED ORDER — ONDANSETRON HCL 4 MG/2ML IJ SOLN
INTRAMUSCULAR | Status: DC | PRN
  Administered 2012-07-21: 4 mg via INTRAVENOUS

## 2012-07-21 MED ORDER — OXYCODONE HCL 5 MG PO TABS
5.0000 mg | ORAL_TABLET | ORAL | Status: DC | PRN
  Administered 2012-07-21 – 2012-07-22 (×2): 5 mg via ORAL
  Filled 2012-07-21: qty 2
  Filled 2012-07-21: qty 1

## 2012-07-21 MED ORDER — TAB-A-VITE/IRON PO TABS
1.0000 | ORAL_TABLET | Freq: Every day | ORAL | Status: DC
  Administered 2012-07-21 – 2012-07-23 (×3): 1 via ORAL
  Filled 2012-07-21 (×3): qty 1

## 2012-07-21 MED ORDER — SENNA-DOCUSATE SODIUM 8.6-50 MG PO TABS: 1.0000 | ORAL_TABLET | Freq: Every day | ORAL | Status: DC

## 2012-07-21 MED ORDER — MIDAZOLAM HCL 2 MG/2ML IJ SOLN: 0.5000 mg | Freq: Once | INTRAMUSCULAR | Status: DC | PRN

## 2012-07-21 MED ORDER — PHENYLEPHRINE HCL 10 MG/ML IJ SOLN
INTRAMUSCULAR | Status: DC | PRN
  Administered 2012-07-21: 80 ug via INTRAVENOUS
  Administered 2012-07-21 (×2): 40 ug via INTRAVENOUS

## 2012-07-21 MED ORDER — ALUM & MAG HYDROXIDE-SIMETH 200-200-20 MG/5ML PO SUSP: 30.0000 mL | ORAL | Status: DC | PRN

## 2012-07-21 MED ORDER — POTASSIUM CHLORIDE IN NACL 20-0.45 MEQ/L-% IV SOLN
INTRAVENOUS | Status: DC
Start: 2012-07-21 — End: 2012-07-23
  Administered 2012-07-22 (×3): via INTRAVENOUS
  Filled 2012-07-21 (×2): qty 1000

## 2012-07-21 MED ORDER — NEOSTIGMINE METHYLSULFATE 1 MG/ML IJ SOLN
INTRAMUSCULAR | Status: DC | PRN
  Administered 2012-07-21: 4 mg via INTRAVENOUS

## 2012-07-21 MED ORDER — WARFARIN - PHARMACIST DOSING INPATIENT: Freq: Every day | Status: DC

## 2012-07-21 MED ORDER — WARFARIN SODIUM 5 MG PO TABS
5.0000 mg | ORAL_TABLET | Freq: Once | ORAL | Status: AC
  Administered 2012-07-22: 5 mg via ORAL
  Filled 2012-07-21: qty 1

## 2012-07-21 MED ORDER — SENNA 8.6 MG PO TABS
1.0000 | ORAL_TABLET | Freq: Two times a day (BID) | ORAL | Status: DC
  Administered 2012-07-22 – 2012-07-23 (×4): 8.6 mg via ORAL
  Filled 2012-07-21 (×6): qty 1

## 2012-07-21 MED ORDER — ONDANSETRON HCL 4 MG PO TABS: 4.0000 mg | ORAL_TABLET | Freq: Four times a day (QID) | ORAL | Status: DC | PRN

## 2012-07-21 MED ORDER — METOCLOPRAMIDE HCL 5 MG/ML IJ SOLN
5.0000 mg | Freq: Three times a day (TID) | INTRAMUSCULAR | Status: DC | PRN
  Filled 2012-07-21: qty 2

## 2012-07-21 MED ORDER — 0.9 % SODIUM CHLORIDE (POUR BTL) OPTIME
TOPICAL | Status: DC | PRN
  Administered 2012-07-21: 1000 mL

## 2012-07-21 MED ORDER — FENTANYL CITRATE 0.05 MG/ML IJ SOLN
INTRAMUSCULAR | Status: DC | PRN
  Administered 2012-07-21: 25 ug via INTRAVENOUS
  Administered 2012-07-21: 50 ug via INTRAVENOUS
  Administered 2012-07-21 (×2): 25 ug via INTRAVENOUS
  Administered 2012-07-21: 50 ug via INTRAVENOUS
  Administered 2012-07-21: 125 ug via INTRAVENOUS

## 2012-07-21 MED ORDER — ACETAMINOPHEN 10 MG/ML IV SOLN
1000.0000 mg | Freq: Once | INTRAVENOUS | Status: DC
  Filled 2012-07-21: qty 100

## 2012-07-21 MED ORDER — WARFARIN VIDEO: Freq: Once | Status: DC

## 2012-07-21 MED ORDER — WARFARIN SODIUM 5 MG PO TABS: 5.0000 mg | ORAL_TABLET | Freq: Every day | ORAL | Status: DC

## 2012-07-21 MED ORDER — PROMETHAZINE HCL 25 MG/ML IJ SOLN
INTRAMUSCULAR | Status: AC
  Administered 2012-07-21: 6.25 mg via INTRAVENOUS
  Filled 2012-07-21: qty 1

## 2012-07-21 MED ORDER — CEFAZOLIN SODIUM-DEXTROSE 2-3 GM-% IV SOLR
2.0000 g | INTRAVENOUS | Status: AC
  Administered 2012-07-21: 2 g via INTRAVENOUS
  Filled 2012-07-21: qty 50

## 2012-07-21 MED ORDER — PATIENT'S GUIDE TO USING COUMADIN BOOK
Freq: Once | Status: AC
  Administered 2012-07-22
  Filled 2012-07-21: qty 1

## 2012-07-21 MED ORDER — MENTHOL 3 MG MT LOZG
1.0000 | LOZENGE | OROMUCOSAL | Status: DC | PRN
  Filled 2012-07-21: qty 9

## 2012-07-21 MED ORDER — CEFAZOLIN SODIUM-DEXTROSE 2-3 GM-% IV SOLR
INTRAVENOUS | Status: AC
  Filled 2012-07-21: qty 100

## 2012-07-21 MED ORDER — PHENOL 1.4 % MT LIQD
1.0000 | OROMUCOSAL | Status: DC | PRN
  Filled 2012-07-21: qty 177

## 2012-07-21 MED ORDER — DOCUSATE SODIUM 100 MG PO CAPS
100.0000 mg | ORAL_CAPSULE | Freq: Two times a day (BID) | ORAL | Status: DC
  Administered 2012-07-21 – 2012-07-23 (×4): 100 mg via ORAL
  Filled 2012-07-21 (×5): qty 1

## 2012-07-21 MED ORDER — MORPHINE SULFATE 2 MG/ML IJ SOLN
0.5000 mg | INTRAMUSCULAR | Status: DC | PRN
  Administered 2012-07-21: 0.5 mg via INTRAVENOUS
  Filled 2012-07-21: qty 1

## 2012-07-21 MED ORDER — BISACODYL 10 MG RE SUPP: 10.0000 mg | Freq: Every day | RECTAL | Status: DC | PRN

## 2012-07-21 MED ORDER — MEPERIDINE HCL 25 MG/ML IJ SOLN: 6.2500 mg | INTRAMUSCULAR | Status: DC | PRN

## 2012-07-21 MED ORDER — VITAMIN B-1 100 MG PO TABS
100.0000 mg | ORAL_TABLET | Freq: Every day | ORAL | Status: DC
  Administered 2012-07-21 – 2012-07-23 (×3): 100 mg via ORAL
  Filled 2012-07-21 (×3): qty 1

## 2012-07-21 MED ORDER — PROPOFOL 10 MG/ML IV BOLUS
INTRAVENOUS | Status: DC | PRN
  Administered 2012-07-21: 150 mg via INTRAVENOUS

## 2012-07-21 SURGICAL SUPPLY — 73 items
APL SKNCLS STERI-STRIP NONHPOA (GAUZE/BANDAGES/DRESSINGS) ×1
BANDAGE ELASTIC 4 VELCRO ST LF (GAUZE/BANDAGES/DRESSINGS) ×1 IMPLANT
BANDAGE ELASTIC 6 VELCRO ST LF (GAUZE/BANDAGES/DRESSINGS) ×2 IMPLANT
BANDAGE ESMARK 6X9 LF (GAUZE/BANDAGES/DRESSINGS) IMPLANT
BANDAGE GAUZE ELAST BULKY 4 IN (GAUZE/BANDAGES/DRESSINGS) ×1 IMPLANT
BENZOIN TINCTURE PRP APPL 2/3 (GAUZE/BANDAGES/DRESSINGS) ×1 IMPLANT
BIT DRILL 3.8X6 NS (BIT) ×1 IMPLANT
BIT DRILL 4.4 NS (BIT) ×1 IMPLANT
BLADE SURG 15 STRL LF DISP TIS (BLADE) ×1 IMPLANT
BLADE SURG 15 STRL SS (BLADE) ×2
BLADE SURG ROTATE 9660 (MISCELLANEOUS) IMPLANT
BNDG CMPR 9X6 STRL LF SNTH (GAUZE/BANDAGES/DRESSINGS)
BNDG COHESIVE 6X5 TAN STRL LF (GAUZE/BANDAGES/DRESSINGS) ×1 IMPLANT
BNDG ESMARK 6X9 LF (GAUZE/BANDAGES/DRESSINGS)
BOOTCOVER CLEANROOM LRG (PROTECTIVE WEAR) ×2 IMPLANT
CLOTH BEACON ORANGE TIMEOUT ST (SAFETY) ×2 IMPLANT
CLSR STERI-STRIP ANTIMIC 1/2X4 (GAUZE/BANDAGES/DRESSINGS) ×1 IMPLANT
COVER SURGICAL LIGHT HANDLE (MISCELLANEOUS) ×3 IMPLANT
CUFF TOURNIQUET SINGLE 34IN LL (TOURNIQUET CUFF) ×1 IMPLANT
CUFF TOURNIQUET SINGLE 44IN (TOURNIQUET CUFF) IMPLANT
DRAPE C-ARM 42X72 X-RAY (DRAPES) ×2 IMPLANT
DRAPE ORTHO SPLIT 77X108 STRL (DRAPES) ×6
DRAPE PROXIMA HALF (DRAPES) ×5 IMPLANT
DRAPE SURG ORHT 6 SPLT 77X108 (DRAPES) ×2 IMPLANT
DRAPE U-SHAPE 47X51 STRL (DRAPES) ×2 IMPLANT
DRSG ADAPTIC 3X8 NADH LF (GAUZE/BANDAGES/DRESSINGS) ×1 IMPLANT
DRSG PAD ABDOMINAL 8X10 ST (GAUZE/BANDAGES/DRESSINGS) ×1 IMPLANT
DURAPREP 26ML APPLICATOR (WOUND CARE) ×2 IMPLANT
ELECT REM PT RETURN 9FT ADLT (ELECTROSURGICAL) ×2
ELECTRODE REM PT RTRN 9FT ADLT (ELECTROSURGICAL) ×1 IMPLANT
FACESHIELD LNG OPTICON STERILE (SAFETY) IMPLANT
GLOVE BIOGEL PI ORTHO PRO SZ8 (GLOVE) ×1
GLOVE ORTHO TXT STRL SZ7.5 (GLOVE) ×2 IMPLANT
GLOVE PI ORTHO PRO STRL SZ8 (GLOVE) ×1 IMPLANT
GLOVE SURG ORTHO 8.0 STRL STRW (GLOVE) ×4 IMPLANT
GOWN STRL NON-REIN LRG LVL3 (GOWN DISPOSABLE) IMPLANT
GUIDEPIN 3.2X17.5 THRD DISP (PIN) ×1 IMPLANT
GUIDEWIRE BALL NOSE 80CM (WIRE) ×1 IMPLANT
KIT BASIN OR (CUSTOM PROCEDURE TRAY) ×2 IMPLANT
KIT ROOM TURNOVER OR (KITS) ×2 IMPLANT
MANIFOLD NEPTUNE II (INSTRUMENTS) ×1 IMPLANT
NAIL IM TIBIAL 10X34.5 (Orthopedic Implant) IMPLANT
NS IRRIG 1000ML POUR BTL (IV SOLUTION) ×2 IMPLANT
PACK GENERAL/GYN (CUSTOM PROCEDURE TRAY) ×2 IMPLANT
PAD ARMBOARD 7.5X6 YLW CONV (MISCELLANEOUS) ×4 IMPLANT
PAD CAST 4YDX4 CTTN HI CHSV (CAST SUPPLIES) IMPLANT
PADDING CAST ABS 4INX4YD NS (CAST SUPPLIES) ×1
PADDING CAST ABS 6INX4YD NS (CAST SUPPLIES) ×1
PADDING CAST ABS COTTON 4X4 ST (CAST SUPPLIES) IMPLANT
PADDING CAST ABS COTTON 6X4 NS (CAST SUPPLIES) IMPLANT
PADDING CAST COTTON 4X4 STRL (CAST SUPPLIES) ×2
SCREW ACECAP 40MM (Screw) ×1 IMPLANT
SCREW ACECAP 52MM (Screw) ×1 IMPLANT
SCREW ACECAP 56MM (Screw) IMPLANT
SCREW PROXIMAL DEPUY (Screw) ×2 IMPLANT
SCREW PRXML FT 50X5.5XLCK NS (Screw) IMPLANT
SPLINT PLASTER CAST XFAST 5X30 (CAST SUPPLIES) IMPLANT
SPLINT PLASTER XFAST SET 5X30 (CAST SUPPLIES) ×1
SPONGE GAUZE 4X4 12PLY (GAUZE/BANDAGES/DRESSINGS) ×3 IMPLANT
STAPLER VISISTAT 35W (STAPLE) ×1 IMPLANT
STOCKINETTE IMPERVIOUS LG (DRAPES) ×2 IMPLANT
STRIP CLOSURE SKIN 1/2X4 (GAUZE/BANDAGES/DRESSINGS) ×1 IMPLANT
SUT MNCRL AB 4-0 PS2 18 (SUTURE) ×2 IMPLANT
SUT VIC AB 0 CT1 18XCR BRD 8 (SUTURE) ×1 IMPLANT
SUT VIC AB 0 CT1 8-18 (SUTURE) ×2
SUT VIC AB 2-0 CT1 27 (SUTURE) ×2
SUT VIC AB 2-0 CT1 TAPERPNT 27 (SUTURE) ×1 IMPLANT
SUT VIC AB 3-0 SH 8-18 (SUTURE) ×2 IMPLANT
TIBIAL NAIL 10X34.5 (Orthopedic Implant) ×2 IMPLANT
TOWEL OR 17X24 6PK STRL BLUE (TOWEL DISPOSABLE) ×2 IMPLANT
TOWEL OR 17X26 10 PK STRL BLUE (TOWEL DISPOSABLE) ×2 IMPLANT
TRAY FOLEY CATH 14FR (SET/KITS/TRAYS/PACK) IMPLANT
WATER STERILE IRR 1000ML POUR (IV SOLUTION) ×1 IMPLANT

## 2012-07-21 NOTE — Progress Notes (Signed)
xrays from [place] are in the front of his chart in the [facility]

## 2012-07-21 NOTE — Op Note (Signed)
07/20/2012 - 07/21/2012  8:13 PM  PATIENT:  Martin Conner    PRE-OPERATIVE DIAGNOSIS:  right tibia and fibula fracture fracture  POST-OPERATIVE DIAGNOSIS:  Same  PROCEDURE:  INTRAMEDULLARY (IM) NAIL TIBIAL  SURGEON:  Eulas Post, MD  PHYSICIAN ASSISTANT: Janace Litten, OPA-C, present and scrubbed throughout the case, critical for completion in a timely fashion, and for retraction, instrumentation, and closure.  ANESTHESIA:   General  PREOPERATIVE INDICATIONS:  Martin Conner is a  79 y.o. male with a diagnosis of right tibia fracture who elected for surgical management.  He has contralateral knee osteoarthritis, and was having extreme difficulty with ambulation. The goals of surgery were to improve long-term alignment, and accelerate capacity for weightbearing.  The risks benefits and alternatives were discussed with the patient preoperatively including but not limited to the risks of infection, bleeding, nerve injury, cardiopulmonary complications, the need for revision surgery, among others, and the patient was willing to proceed.  OPERATIVE IMPLANTS: Biomet tibial nail size 34.5 x 10 mm, with one proximal interlocking bolts and 2 distal interlocking bolts.  OPERATIVE FINDINGS: Moderate osteopenia, displaced angulated unstable tibia fracture, with fibular fracture that maintained appropriate alignment.  OPERATIVE PROCEDURE: The patient was brought to the operating room and placed in supine position. General anesthesia was administered. IV antibiotics were given. The right lower extremity was prepped and draped in usual sterile fashion. Time out was performed. The leg was elevated and exsanguinated and the tourniquet was inflated. Total tourniquet time was 66 minutes. Incision was made over the patellar tendon and the tendon was split. A guide pin was introduced into the proximal tibia and confirmed to be in satisfactory location on AP and lateral views. He had a very strange anatomy to his  proximal tibia, with a large Osgood-Schlatter's lesion from many years ago, along with an unusual slope to the proximal tibia. Nonetheless I had a satisfactory position.  I opened the proximal tibia with the reamer, introduced the long guidewire, and reduced the fracture is anatomically as possible using a percutaneous tenaculum clamp.  I reamed sequentially over the guidewire after confirming reduction on AP and lateral views, and then reamed to an 11.5, placed a 10 nail with relative he is. I secured the nail proximally with a bicortical screw using the oblique position, in order to minimize prominence on the tubercle. I did not think a second screw would be of significant benefit, as I had good bicortical purchase with the first screw, and the fracture was fairly distal with a well fitted nail.  I secured the nail distally with 2 bicortical screws that had satisfactory purchase, and took final C-arm pictures, irrigated the wounds copiously and repaired the tendon with Vicryl followed by Vicryl for the subcutaneous tissue with Monocryl and Steri-Strips and sterile gauze and a posterior splint. He was awakened and returned to the PACU in tape stable and satisfactory condition. There no complications and he tolerated the procedure well. We will plan to keep him touch toe weightbearing for a period of about 2 weeks, and advance his weightbearing as he gets more comfortable. We will also plan on Lovenox bridging to Coumadin for DVT prophylaxis for a period of 4 weeks.

## 2012-07-21 NOTE — Anesthesia Preprocedure Evaluation (Addendum)
Anesthesia Evaluation  Patient identified by MRN, date of birth, ID band Patient awake    Reviewed: Allergy & Precautions, H&P , NPO status , Patient's Chart, lab work & pertinent test results, reviewed documented beta blocker date and time   History of Anesthesia Complications Negative for: history of anesthetic complications  Airway Mallampati: II TM Distance: >3 FB Neck ROM: Full    Dental  (+) Teeth Intact and Dental Advisory Given   Pulmonary neg pulmonary ROS,   Few crackles in B bases Pulmonary exam normal       Cardiovascular Exercise Tolerance: Good hypertension, Pt. on medications and Pt. on home beta blockers + dysrhythmias Atrial Fibrillation + Valvular Problems/Murmurs MR Rhythm:Irregular Rate:Normal     Neuro/Psych negative neurological ROS     GI/Hepatic negative GI ROS, Neg liver ROS,   Endo/Other  negative endocrine ROS  Renal/GU negative Renal ROS     Musculoskeletal   Abdominal   Peds  Hematology negative hematology ROS (+)   Anesthesia Other Findings   Reproductive/Obstetrics                         Anesthesia Physical Anesthesia Plan  ASA: III  Anesthesia Plan: General   Post-op Pain Management:    Induction: Intravenous  Airway Management Planned: Oral ETT  Additional Equipment:   Intra-op Plan:   Post-operative Plan: Extubation in OR  Informed Consent: I have reviewed the patients History and Physical, chart, labs and discussed the procedure including the risks, benefits and alternatives for the proposed anesthesia with the patient or authorized representative who has indicated his/her understanding and acceptance.   Dental advisory given  Plan Discussed with: Surgeon and CRNA  Anesthesia Plan Comments: (Plan routine monitors, GETA)        Anesthesia Quick Evaluation

## 2012-07-21 NOTE — Anesthesia Procedure Notes (Signed)
Procedure Name: Intubation Date/Time: 07/21/2012 6:41 PM Performed by: Angelica Pou Pre-anesthesia Checklist: Patient identified, Timeout performed, Emergency Drugs available, Suction available and Patient being monitored Patient Re-evaluated:Patient Re-evaluated prior to inductionOxygen Delivery Method: Circle system utilized Preoxygenation: Pre-oxygenation with 100% oxygen Intubation Type: IV induction Ventilation: Two handed mask ventilation required and Oral airway inserted - appropriate to patient size Laryngoscope Size: Mac and 4 Grade View: Grade I Tube type: Oral Tube size: 7.5 mm Number of attempts: 1 Airway Equipment and Method: Stylet and Oral airway Placement Confirmation: ETT inserted through vocal cords under direct vision,  breath sounds checked- equal and bilateral and positive ETCO2 Secured at: 23 cm Tube secured with: Tape Dental Injury: Teeth and Oropharynx as per pre-operative assessment

## 2012-07-21 NOTE — Progress Notes (Signed)
The patient has been re-examined, and the chart reviewed, and there have been no interval changes to the documented history and physical.    The risks, benefits, and alternatives have been discussed at length, and the patient is willing to proceed.   

## 2012-07-21 NOTE — Progress Notes (Addendum)
ANTICOAGULATION CONSULT NOTE - Initial Consult  Pharmacy Consult for Coumadin Indication: VTE prophylaxis  No Known Allergies  Patient Measurements: Height: 5\' 10"  (177.8 cm) Weight: 192 lb (87.091 kg) IBW/kg (Calculated) : 73 Heparin Dosing Weight: n/a  Vital Signs: Temp: 98.9 F (37.2 C) (04/30 2115) Temp src: Oral (04/30 1400) BP: 116/98 mmHg (04/30 2110) Pulse Rate: 83 (04/30 2115)  Labs:  Recent Labs  07/20/12 1510 07/20/12 1820 07/21/12 1020  HGB 14.4  --   --   HCT 41.8  --   --   PLT 220  --   --   LABPROT  --  13.9  --   INR  --  1.08  --   CREATININE 0.87  --   --   TROPONINI <0.30  --  <0.30    Estimated Creatinine Clearance: 67.6 ml/min (by C-G formula based on Cr of 0.87).   Medical History: Past Medical History  Diagnosis Date  . Hyperlipidemia   . Hypertension   . Lumbar disc disease   . Osteoarthritis     Medications:  Scheduled:  . [COMPLETED] sodium chloride   Intravenous STAT  . ceFAZolin      . [COMPLETED]  ceFAZolin (ANCEF) IV  2 g Intravenous 30 min Pre-Op  .  ceFAZolin (ANCEF) IV  2 g Intravenous Q6H  . docusate sodium  100 mg Oral BID  . [START ON 07/22/2012] enoxaparin (LOVENOX) injection  40 mg Subcutaneous Q24H  . metoprolol tartrate  100 mg Oral BID  . multivitamins with iron  1 tablet Oral Daily  . senna  1 tablet Oral BID  . thiamine  100 mg Oral Daily  . [DISCONTINUED] acetaminophen  1,000 mg Intravenous Once  . [DISCONTINUED] metoprolol  5 mg Intravenous Q6H  . [DISCONTINUED] sodium chloride  3 mL Intravenous Q12H  . [DISCONTINUED] sodium chloride  3 mL Intravenous Q12H    Assessment: 79 yo male s/p R tibia and fibula fracture repair.  Pharmacy asked to begin Coumadin for VTE prophylaxis.  No anticoagulants noted PTA.  Spoke with patient's wife, no hx of bleeding.  Baseline INR WNL.  Goal of Therapy:  INR 2-3 Monitor platelets by anticoagulation protocol: Yes   Plan:  1. Warfarin 5 mg po x 1 tonight. 2. Daily  PT/INR. 3. Will begin Coumadin education.  Tad Moore, BCPS  Clinical Pharmacist Pager [phone]  07/21/2012 9:30 PM

## 2012-07-21 NOTE — Anesthesia Postprocedure Evaluation (Signed)
  Anesthesia Post-op Note  Patient: Martin Conner  Procedure(s) Performed: Procedure(s): INTRAMEDULLARY (IM) NAIL TIBIAL (Right)  Patient Location: PACU  Anesthesia Type:General  Level of Consciousness: awake  Airway and Oxygen Therapy: Patient Spontanous Breathing  Post-op Pain: mild  Post-op Assessment: Post-op Vital signs reviewed  Post-op Vital Signs: stable  Complications: No apparent anesthesia complications

## 2012-07-21 NOTE — Preoperative (Signed)
Beta Blockers   Reason not to administer Beta Blockers:Not Applicable, took metoprolol 04/30 1221 per MAR.

## 2012-07-21 NOTE — Progress Notes (Signed)
Subjective: The patient denies headache, dizziness, chest pain, shortness of breath. He denies a purulent cough, diarrhea, or pain with urination. His right leg pain is controlled with as needed oxycodone.  Objective: Vital signs in last 24 hours: Filed Vitals:   07/20/12 2245 07/20/12 2330 07/21/12 0027 07/21/12 0359  BP: 184/77 148/74 154/85 155/88  Pulse: 89 88 91 83  Temp:   102.1 F (38.9 C) 98.2 F (36.8 C)  TempSrc:   Oral Oral  Resp: 25 23    Height:   5\' 10"  (1.778 m)   Weight:   87.091 kg (192 lb)   SpO2: 96% 95% 96% 97%    Intake/Output Summary (Last 24 hours) at 07/21/12 0946 Last data filed at 07/21/12 0900  Gross per 24 hour  Intake    360 ml  Output    325 ml  Net     35 ml    Weight change:   Physical exam: General: Elderly 79 year old Caucasian man sitting up in bed, alert. Lungs: Rare crackles mid lobes. Breathing is nonlabored. Heart: Irregular, irregular. Abdomen: Mildly obese, positive bowel sounds, soft, nontender, nondistended. Extremities: Cast/dressing on right lower extremity. The patient is able to wiggle his toes. No pedal edema on the left. Neurologic/psychiatric: He is alert and oriented x3. Cranial nerves II through XII are intact. No signs of tremulousness. His speech is clear. He has a pleasant affect.   Lab Results: Basic Metabolic Panel:  Recent Labs  16/10/96 1510  NA 137  K 4.2  CL 100  CO2 26  GLUCOSE 87  BUN 15  CREATININE 0.87  CALCIUM 9.5   Liver Function Tests:  Recent Labs  07/20/12 1820  AST 19  ALT 13  ALKPHOS 74  BILITOT 1.7*  PROT 7.3  ALBUMIN 3.4*   No results found for this basename: LIPASE, AMYLASE,  in the last 72 hours No results found for this basename: AMMONIA,  in the last 72 hours CBC:  Recent Labs  07/20/12 1510  WBC 12.3*  NEUTROABS 8.9*  HGB 14.4  HCT 41.8  MCV 91.7  PLT 220   Cardiac Enzymes:  Recent Labs  07/20/12 1510  TROPONINI <0.30   BNP: No results found for this  basename: PROBNP,  in the last 72 hours D-Dimer: No results found for this basename: DDIMER,  in the last 72 hours CBG: No results found for this basename: GLUCAP,  in the last 72 hours Hemoglobin A1C: No results found for this basename: HGBA1C,  in the last 72 hours Fasting Lipid Panel: No results found for this basename: CHOL, HDL, LDLCALC, TRIG, CHOLHDL, LDLDIRECT,  in the last 72 hours Thyroid Function Tests: No results found for this basename: TSH, T4TOTAL, FREET4, T3FREE, THYROIDAB,  in the last 72 hours Anemia Panel: No results found for this basename: VITAMINB12, FOLATE, FERRITIN, TIBC, IRON, RETICCTPCT,  in the last 72 hours Coagulation:  Recent Labs  07/20/12 1820  LABPROT 13.9  INR 1.08   Urine Drug Screen: Drugs of Abuse  No results found for this basename: labopia,  cocainscrnur,  labbenz,  amphetmu,  thcu,  labbarb    Alcohol Level: No results found for this basename: ETH,  in the last 72 hours Urinalysis:  Recent Labs  07/20/12 2231  COLORURINE AMBER*  LABSPEC 1.025  PHURINE 6.0  GLUCOSEU NEGATIVE  HGBUR LARGE*  BILIRUBINUR NEGATIVE  KETONESUR 15*  PROTEINUR 30*  UROBILINOGEN 1.0  NITRITE NEGATIVE  LEUKOCYTESUR NEGATIVE   Misc. Labs:   Micro: No  results found for this or any previous visit (from the past 240 hour(s)).  Studies/Results: No results found.  Medications:  Scheduled: . metoprolol  5 mg Intravenous Q6H  . multivitamins with iron  1 tablet Oral Daily  . sodium chloride  3 mL Intravenous Q12H  . sodium chloride  3 mL Intravenous Q12H  . thiamine  100 mg Oral Daily   Continuous:  ZOX:WRUEAV chloride, acetaminophen, acetaminophen, hydrALAZINE, LORazepam, ondansetron (ZOFRAN) IV, ondansetron, oxyCODONE, sodium chloride  Assessment: Principal Problem:   Syncope Active Problems:   Tibia/fibula fracture   HYPERLIPIDEMIA   HYPERTENSION   OSTEOARTHRITIS   A-fib   Heavy alcohol consumption   Fever   1. Syncope. Given his  history provided including not eating or drinking anything on the morning of the episode coupled with taking over-the-counter decongestant/cold medication coupled with walking over a mile which she is not used to, the likely etiology is multifactorial including side effect of medication, vasovagal event, and/or orthostatic hypotension. His initial cardiac enzyme is negative. 2-D echocardiogram has been ordered. We'll order TSH. He is neurologically intact.  Chronic atrial fibrillation. He is treated chronically with aspirin and metoprolol. Currently stable.  Hypertension. Systolic blood pressure slightly elevated.  Right tibia/fibular fracture. Orthopedic surgeon Dr. Dion Saucier has evaluated the patient he plans operative repair this afternoon.  Fever (overnight). Etiology unknown. His initial urinalysis is not indicative of infection. We'll order another urinalysis and chest x-ray for further evaluation. We'll order blood cultures if he spikes again.  Heavy alcohol consumption. Thiamine and a multivitamin were ordered this morning. We'll continue. We'll continue as needed lorazepam. There are no signs of alcohol withdrawal syndrome.  Plan:  1. We'll discontinue IV metoprolol and restart oral metoprolol before the operation. 2. For further workup of his syncope, a 2-D echocardiogram has been ordered, TSH has been ordered, and another troponin I. 3. For further evaluation of fever, will order a chest x-ray and another urinalysis. We'll check blood cultures x2 for a temperature greater than 100.5. 4. Operative repair of right tibia/fibular fracture today plan.   LOS: 1 day   Martin Conner 07/21/2012, 9:46 AM

## 2012-07-21 NOTE — Transfer of Care (Signed)
Immediate Anesthesia Transfer of Care Note  Patient: Martin Conner  Procedure(s) Performed: Procedure(s): INTRAMEDULLARY (IM) NAIL TIBIAL (Right)  Patient Location: PACU  Anesthesia Type:General  Level of Consciousness: awake, alert , pateint uncooperative and confused  Airway & Oxygen Therapy: Patient Spontanous Breathing and Patient connected to nasal cannula oxygen  Post-op Assessment: Report given to PACU RN, Post -op Vital signs reviewed and stable and Patient moving all extremities X 4  Post vital signs: Reviewed and stable  Complications: No apparent anesthesia complications

## 2012-07-22 DIAGNOSIS — I517 Cardiomegaly: Secondary | ICD-10-CM

## 2012-07-22 LAB — CBC
MCH: 31.6 pg (ref 26.0–34.0)
MCHC: 34.9 g/dL (ref 30.0–36.0)
Platelets: 185 10*3/uL (ref 150–400)
RBC: 3.86 MIL/uL — ABNORMAL LOW (ref 4.22–5.81)

## 2012-07-22 LAB — BASIC METABOLIC PANEL
CO2: 25 mEq/L (ref 19–32)
Calcium: 8.8 mg/dL (ref 8.4–10.5)
GFR calc non Af Amer: 77 mL/min — ABNORMAL LOW (ref >90)
Sodium: 136 mEq/L (ref 135–145)

## 2012-07-22 LAB — URINE CULTURE

## 2012-07-22 LAB — PROTIME-INR: Prothrombin Time: 15.2 seconds (ref 11.6–15.2)

## 2012-07-22 NOTE — Progress Notes (Signed)
Orthopedic Tech Progress Note Patient Details:  Martin Conner 11-Jan-1928 [id number] OHF applied to bed Patient ID: Martin Conner, male   DOB: [birth year], 79 y.o.   MRN: [id number]   Orie Rout 07/22/2012, 12:42 PM

## 2012-07-22 NOTE — Progress Notes (Signed)
ANTICOAGULATION CONSULT NOTE - Follow up Consult  Pharmacy Consult for Coumadin Indication: VTE prophylaxis  No Known Allergies  Patient Measurements: Height: 5\' 10"  (177.8 cm) Weight: 200 lb 8 oz (90.946 kg) IBW/kg (Calculated) : 73 Heparin Dosing Weight: n/a  Vital Signs: BP: 126/76 mmHg (05/01 1023) Pulse Rate: 96 (05/01 1023)  Labs:  Recent Labs  07/20/12 1510 07/20/12 1820 07/21/12 1020 07/21/12 2133 07/22/12 0605  HGB 14.4  --   --  13.1 12.2*  HCT 41.8  --   --  37.4* 35.0*  PLT 220  --   --  171 185  LABPROT  --  13.9  --   --  15.2  INR  --  1.08  --   --  1.22  CREATININE 0.87  --   --  0.85 0.89  TROPONINI <0.30  --  <0.30  --   --     Estimated Creatinine Clearance: 72.6 ml/min (by C-G formula based on Cr of 0.89).   Medical History: Past Medical History  Diagnosis Date  . Hyperlipidemia   . Hypertension   . Lumbar disc disease   . Osteoarthritis     Medications:  Scheduled:  . [EXPIRED] ceFAZolin      . [COMPLETED]  ceFAZolin (ANCEF) IV  2 g Intravenous 30 min Pre-Op  . [COMPLETED]  ceFAZolin (ANCEF) IV  2 g Intravenous Q6H  . docusate sodium  100 mg Oral BID  . enoxaparin (LOVENOX) injection  40 mg Subcutaneous Q24H  . metoprolol tartrate  100 mg Oral BID  . multivitamins with iron  1 tablet Oral Daily  . [COMPLETED] patient's guide to using coumadin book   Does not apply Once  . senna  1 tablet Oral BID  . thiamine  100 mg Oral Daily  . warfarin  5 mg Oral ONCE-1800  . warfarin   Does not apply Once  . Warfarin - Pharmacist Dosing Inpatient   Does not apply q1800  . [DISCONTINUED] acetaminophen  1,000 mg Intravenous Once  . [DISCONTINUED] sodium chloride  3 mL Intravenous Q12H  . [DISCONTINUED] sodium chloride  3 mL Intravenous Q12H    Assessment: 79 yo male s/p R tibia and fibula fracture repair requiring coumadin for dvt px. No coumadin ordered last pm as stated in pharmacist note. Currently receiving lovenox 40mg  daily until INR  therapeutic.  Goal of Therapy:  INR 2-3 Monitor platelets by anticoagulation protocol: Yes   Plan:  Coumadin 5mg  today and f/u daily protime.  Verlene Mayer, PharmD, BCPS Pager [phone]  07/22/2012 1:11 PM

## 2012-07-22 NOTE — Progress Notes (Signed)
Patient ID: Martin Conner, male   DOB: 1928-03-22, 79 y.o.   MRN: [id number]     Subjective:  Patient reports pain as mild to moderate.  He states that he feels much better today but he was really in pain last night.  He denies CP or SOB.  Objective:   VITALS:   Filed Vitals:   07/22/12 0000 07/22/12 0400 07/22/12 0446 07/22/12 0556  BP:   154/74   Pulse:      Temp:      TempSrc:      Resp: 19 18 18    Height:      Weight:    90.946 kg (200 lb 8 oz)  SpO2: 93% 95%      ABD soft Sensation intact distally Dorsiflexion/Plantar flexion intact Incision: dressing C/D/I and no drainage   Lab Results  Component Value Date   WBC 10.7* 07/22/2012   HGB 12.2* 07/22/2012   HCT 35.0* 07/22/2012   MCV 90.7 07/22/2012   PLT 185 07/22/2012     Assessment/Plan: 1 Day Post-Op   Principal Problem:   Syncope Active Problems:   HYPERLIPIDEMIA   HYPERTENSION   OSTEOARTHRITIS   A-fib   Tibia/fibula fracture   Heavy alcohol consumption   Fever   Advance diet Up with therapy Continue plan per medicine Okay for SNF placement when okay with Medicine Continue Splint at all times   Martin Conner 07/22/2012, 9:37 AM   Teryl Lucy, MD Cell [phone]

## 2012-07-22 NOTE — Evaluation (Signed)
Physical Therapy Evaluation Patient Details Name: Martin Conner MRN: [id number] DOB: [birth year] Today's Date: 07/22/2012 Time: 4098-1191 PT Time Calculation (min): 30 min  PT Assessment / Plan / Recommendation Clinical Impression  Patient is an 79 y/o male admitted s/p syncope with right tib-fib fracture and IM nailing.  He presents with decreased mobility due to pain, decreased weight bearing right LE, decreased activity tolerance, decreased balance and decreased safety awareness.  Likely would be able to discharge home with HHPT if family able to assist otherwise may need STSNF.  Will follow acutely.     PT Assessment  Patient needs continued PT services    Follow Up Recommendations  Home health PT;Supervision/Assistance - 24 hour;SNF (depending on progress and family ability to assist )          Equipment Recommendations  Rolling walker with 5" wheels       Frequency Min 5X/week    Precautions / Restrictions Precautions Precautions: Fall Restrictions Weight Bearing Restrictions: Yes RLE Weight Bearing: Partial weight bearing   Pertinent Vitals/Pain Min c/o right LE pain with ambulation      Mobility  Bed Mobility Bed Mobility: Supine to Sit;Sitting - Scoot to Edge of Bed Supine to Sit: 4: Min assist;HOB flat Sitting - Scoot to [place] of Bed: 4: Min assist Details for Bed Mobility Assistance: Pt needed Min (A) with Rt LE to help scoot to EOB.Pt needed v/c for Rt LE positioning at EOB Transfers Sit to Stand: 3: Mod assist;With upper extremity assist;From bed Stand to Sit: 3: Mod assist;With upper extremity assist;To chair/3-in-1 Details for Transfer Assistance: Pt requires BIL UE on RW for stability static standing. pt progressing with ambulation with mod v/c for safety. Pt prefers to keep Rt LE elevated with left LE hop. Pt encouraged to let Rt LE rest on floor for balance. Pt slightly impulsive Ambulation/Gait Ambulation/Gait Assistance: 4: Min assist Ambulation Distance  (Feet): 8 Feet Assistive device: Rolling walker Ambulation/Gait Assistance Details: cues for sequencing with PWB and not to "bunny hop" Gait Pattern: Step-to pattern;Antalgic        PT Diagnosis: Difficulty walking;Acute pain  PT Problem List: Decreased strength;Decreased range of motion;Decreased activity tolerance;Decreased knowledge of use of DME;Pain;Decreased balance;Decreased knowledge of precautions;Decreased mobility PT Treatment Interventions: DME instruction;Gait training;Stair training;Balance training;Functional mobility training;Patient/family education;Therapeutic activities;Therapeutic exercise   PT Goals Acute Rehab PT Goals PT Goal Formulation: With patient Time For Goal Achievement: 07/29/12 Potential to Achieve Goals: Good Pt will go Supine/Side to Sit: with modified independence PT Goal: Supine/Side to Sit - Progress: Goal set today Pt will go Sit to Supine/Side: with modified independence PT Goal: Sit to Supine/Side - Progress: Goal set today Pt will go Sit to Stand: with modified independence PT Goal: Sit to Stand - Progress: Goal set today Pt will go Stand to Sit: with modified independence PT Goal: Stand to Sit - Progress: Goal set today Pt will Stand: with modified independence;with unilateral upper extremity support;3 - 5 min PT Goal: Stand - Progress: Goal set today Pt will Ambulate: 51 - 150 feet;with supervision;with rolling walker PT Goal: Ambulate - Progress: Goal set today Pt will Go Up / Down Stairs: 3-5 stairs;with min assist;with rolling walker PT Goal: Up/Down Stairs - Progress: Goal set today Pt will Perform Home Exercise Program: with supervision, verbal cues required/provided PT Goal: Perform Home Exercise Program - Progress: Goal set today  Visit Information  Last PT Received On: 07/22/12 Assistance Needed: +1    Subjective Data  Subjective: I haven't  even been able to brush my teeth. Patient Stated Goal: To return to independent    Prior Functioning  Home Living Lives With: Spouse Available Help at Discharge: Family Type of Home: House Home Access: Stairs to enter Secretary/administrator of Steps: 2 Entrance Stairs-Rails: None Home Layout: One level Bathroom Shower/Tub: Walk-in shower;Door [employer] Toilet: Standard Home Adaptive Equipment: Bedside commode/3-in-1;Crutches Prior Function Level of Independence: Independent Able to Take Stairs?: Yes Driving: Yes Vocation: Retired Musician: No difficulties Dominant Hand: Right    Cognition  Cognition Arousal/Alertness: Awake/alert Behavior During Therapy: WFL for tasks assessed/performed Overall Cognitive Status: Impaired/Different from baseline Area of Impairment: Safety/judgement;Memory Memory: Decreased recall of precautions Safety/Judgement: Decreased awareness of safety General Comments: Pt with poor awareness to time. Pt needed cues for orientation to morning time. Pt reporting that time of day was later afternoon. Pt was however oriented to month and day of week. Pt demonstrates unsafe use of RW and decr awareness for need for (A)     Extremity/Trunk Assessment Right Upper Extremity Assessment RUE ROM/Strength/Tone: Within functional levels RUE Sensation: WFL - Light Touch RUE Coordination: WFL - gross/fine motor Left Upper Extremity Assessment LUE ROM/Strength/Tone: Within functional levels LUE Sensation: WFL - Light Touch LUE Coordination: WFL - gross/fine motor Right Lower Extremity Assessment RLE ROM/Strength/Tone: Deficits;Due to pain;Unable to fully assess RLE ROM/Strength/Tone Deficits: due to splint on right LE; lifts antigravity RLE Sensation: WFL - Light Touch Left Lower Extremity Assessment LLE ROM/Strength/Tone: WFL for tasks assessed LLE Sensation: WFL - Light Touch Trunk Assessment Trunk Assessment: Normal   Balance Balance Balance Assessed: Yes Static Sitting Balance Static Sitting - Balance Support:  Bilateral upper extremity supported;Feet supported Static Sitting - Level of Assistance: 5: Stand by assistance Static Standing Balance Static Standing - Balance Support: No upper extremity supported;During functional activity Static Standing - Level of Assistance: 3: Mod assist Static Standing - Comment/# of Minutes: LOB posteriorly with BIL UE on urinal static standing at EOB. Pt unable to correct LOB without (A)  End of Session PT - End of Session Equipment Utilized During Treatment: Gait belt Activity Tolerance: Patient limited by pain Patient left: in chair;with call bell/phone within reach Nurse Communication: Mobility status  GP     [facility] 07/22/2012, 10:13 AM Sheran Lawless, PT [phone] 07/22/2012

## 2012-07-22 NOTE — Progress Notes (Signed)
  Echocardiogram 2D Echocardiogram has been performed.  Ellender Hose A 07/22/2012, 2:37 PM

## 2012-07-22 NOTE — Evaluation (Signed)
Occupational Therapy Evaluation Patient Details Name: Martin Conner MRN: [id number] DOB: 1927/03/27 Today's Date: 07/22/2012 Time: 9629-5284 OT Time Calculation (min): 30 min  OT Assessment / Plan / Recommendation Clinical Impression  79 yo male admitted s/p fall with syncope with IM nail Rt Tib / fib that could benefit from OT acutely. Recommend SNf for d/c planning    OT Assessment  Patient needs continued OT Services    Follow Up Recommendations  SNF    Barriers to Discharge      Equipment Recommendations  3 in 1 bedside comode;Other (comment) (RW)    Recommendations for Other Services    Frequency  Min 2X/week    Precautions / Restrictions Precautions Precautions: Fall Restrictions Weight Bearing Restrictions: Yes (patial WB) RLE Weight Bearing: Partial weight bearing   Pertinent Vitals/Pain     ADL  Eating/Feeding: Modified independent Where Assessed - Eating/Feeding: Chair Grooming: Wash/dry face;Wash/dry hands;Teeth care;Modified independent Where Assessed - Grooming: Supported sitting Lower Body Dressing: +1 Total assistance Where Assessed - Lower Body Dressing: Unsupported sitting (don sock on Lt LE) Toilet Transfer: Moderate assistance Toilet Transfer Method: Sit to stand Toilet Transfer Equipment: Raised toilet seat with arms (or 3-in-1 over toilet) Toileting - Clothing Manipulation and Hygiene: Minimal assistance Where Assessed - Toileting Clothing Manipulation and Hygiene: Sit to stand from 3-in-1 or toilet Equipment Used: Gait belt;Rolling walker Transfers/Ambulation Related to ADLs: Pt completed sit<>Stand with v/c for hand placement and safety. Pt with posterior lean with attempting to use urinal ADL Comments: Pt agreeable to OOB this session. Pt attempting to void bladder with urinal at EOB. pt unsuccessfully and attempting to static stand alone. Pt dropped urinal and decr awareness of deficits. Pt needed (A) to static stand. Pt with LOB posteriorly and  uncontrolled descend to bed surface with BIL UE released from RW. Pt sitting at sink for all grooming adls    OT Diagnosis: Generalized weakness;Acute pain;Cognitive deficits  OT Problem List: Decreased strength;Decreased activity tolerance;Impaired balance (sitting and/or standing);Decreased safety awareness;Decreased knowledge of use of DME or AE;Decreased knowledge of precautions;Pain;Obesity OT Treatment Interventions: Self-care/ADL training;DME and/or AE instruction;Therapeutic activities;Cognitive remediation/compensation;Balance training;Patient/family education   OT Goals Acute Rehab OT Goals OT Goal Formulation: With patient Time For Goal Achievement: 08/05/12 Potential to Achieve Goals: Good ADL Goals Pt Will Perform Grooming: with modified independence;Standing at sink ADL Goal: Grooming - Progress: Goal set today Pt Will Perform Lower Body Bathing: with modified independence;Sit to stand from chair;with adaptive equipment ADL Goal: Lower Body Bathing - Progress: Goal set today Pt Will Perform Lower Body Dressing: with modified independence;Sit to stand from chair;with adaptive equipment ADL Goal: Lower Body Dressing - Progress: Goal set today Pt Will Transfer to Toilet: with modified independence;Ambulation;3-in-1 ADL Goal: Toilet Transfer - Progress: Goal set today Pt Will Perform Toileting - Clothing Manipulation: with modified independence;Sitting on 3-in-1 or toilet ADL Goal: Toileting - Clothing Manipulation - Progress: Goal set today Miscellaneous OT Goals Miscellaneous OT Goal #1: Pt will complet e bed mobility mod I with HOB flat as precursor to adls OT Goal: Miscellaneous Goal #1 - Progress: Goal set today  Visit Information  Last OT Received On: 07/22/12 Assistance Needed: +1 PT/OT Co-Evaluation/Treatment: Yes    Subjective Data  Subjective: " I think I could have walked on it if it had been the other leg" Patient Stated Goal: walking first; "getting around by  myself"   Prior Functioning     Home Living Lives With: Spouse Available Help at Discharge: Family Type  of Home: House Home Access: Stairs to enter [employer] of Steps: 2 Entrance Stairs-Rails: None Home Layout: One level Bathroom Shower/Tub: Walk-in shower;Door [employer] Toilet: Standard Home Adaptive Equipment: Bedside commode/3-in-1;Crutches Prior Function Level of Independence: Independent Able to Take Stairs?: Yes Driving: Yes Vocation: Retired Musician: No difficulties Dominant Hand: Right         Vision/Perception Vision - History Baseline Vision: Wears glasses only for reading Patient Visual Report: No change from baseline   Cognition  Cognition Arousal/Alertness: Awake/alert Behavior During Therapy: WFL for tasks assessed/performed Overall Cognitive Status: Impaired/Different from baseline Area of Impairment: Safety/judgement;Memory Memory: Decreased recall of precautions Safety/Judgement: Decreased awareness of safety General Comments: Pt with poor awareness to time. Pt needed cues for orientation to morning time. Pt reporting that time of day was later afternoon. Pt was however oriented to month and day of week. Pt demonstrates unsafe use of RW and decr awareness for need for (A)     Extremity/Trunk Assessment Right Upper Extremity Assessment RUE ROM/Strength/Tone: Within functional levels RUE Sensation: WFL - Light Touch RUE Coordination: WFL - gross/fine motor Left Upper Extremity Assessment LUE ROM/Strength/Tone: Within functional levels LUE Sensation: WFL - Light Touch LUE Coordination: WFL - gross/fine motor Trunk Assessment Trunk Assessment: Normal     Mobility Bed Mobility Bed Mobility: Supine to Sit;Sitting - Scoot to Edge of Bed Supine to Sit: 4: Min assist;HOB flat Sitting - Scoot to [place] of Bed: 4: Min assist Details for Bed Mobility Assistance: Pt needed Min (A) with Rt LE to help scoot to EOB.Pt needed  v/c for Rt LE positioning at EOB Transfers Transfers: Sit to Stand;Stand to Sit Sit to Stand: 3: Mod assist;With upper extremity assist;From bed Stand to Sit: 3: Mod assist;With upper extremity assist;To chair/3-in-1 Details for Transfer Assistance: Pt requires BIL UE on RW for stability static standing. pt progressing with ambulation with mod v/c for safety. Pt prefers to keep Rt LE elevated with left LE hop. Pt encouraged to let Rt LE rest on floor for balance. Pt slightly impulsive     Exercise     Balance Balance Balance Assessed: Yes Static Sitting Balance Static Sitting - Balance Support: Bilateral upper extremity supported;Feet supported Static Sitting - Level of Assistance: 5: Stand by assistance Static Standing Balance Static Standing - Balance Support: No upper extremity supported;During functional activity Static Standing - Level of Assistance: 3: Mod assist Static Standing - Comment/# of Minutes: LOB posteriorly with BIL UE on urinal static standing at EOB. Pt unable to correct LOB without (A)   End of Session OT - End of Session Activity Tolerance: Patient tolerated treatment well Patient left: with call bell/phone within reach;in chair Nurse Communication: Mobility status;Precautions  GO     Lucile Shutters 07/22/2012, 9:58 AM Pager: [phone]

## 2012-07-22 NOTE — Progress Notes (Signed)
Subjective: The patient says that his right leg pain is being controlled. Currently, he denies pain. No other complaints.  Objective: Vital signs in last 24 hours: Filed Vitals:   07/22/12 0400 07/22/12 0446 07/22/12 0556 07/22/12 1023  BP:  154/74  126/76  Pulse:    96  Temp:      TempSrc:      Resp: 18 18    Height:      Weight:   90.946 kg (200 lb 8 oz)   SpO2: 95%       Intake/Output Summary (Last 24 hours) at 07/22/12 1040 Last data filed at 07/22/12 0900  Gross per 24 hour  Intake   1760 ml  Output    730 ml  Net   1030 ml    Weight change: 3.856 kg (8 lb 8 oz)  Physical exam: General: Elderly 79 year old Caucasian man sitting up in the chair, eating breakfast. Lungs: Clear anteriorly. Breathing is nonlabored. Heart: Irregular, irregular. Abdomen: Mildly obese, positive bowel sounds, soft, nontender, nondistended. Extremities: Cast/dressing on right lower extremity. The patient is able to wiggle his toes. No pedal edema on the left. Neurologic/psychiatric: He is alert and oriented x3. Cranial nerves II through XII are intact. No signs of tremulousness. His speech is clear. He has a pleasant affect.   Lab Results: Basic Metabolic Panel:  Recent Labs  46/96/29 1510 07/21/12 2133 07/22/12 0605  NA 137  --  136  K 4.2  --  4.1  CL 100  --  99  CO2 26  --  25  GLUCOSE 87  --  89  BUN 15  --  11  CREATININE 0.87 0.85 0.89  CALCIUM 9.5  --  8.8   Liver Function Tests:  Recent Labs  07/20/12 1820  AST 19  ALT 13  ALKPHOS 74  BILITOT 1.7*  PROT 7.3  ALBUMIN 3.4*   No results found for this basename: LIPASE, AMYLASE,  in the last 72 hours No results found for this basename: AMMONIA,  in the last 72 hours CBC:  Recent Labs  07/20/12 1510 07/21/12 2133 07/22/12 0605  WBC 12.3* 12.0* 10.7*  NEUTROABS 8.9*  --   --   HGB 14.4 13.1 12.2*  HCT 41.8 37.4* 35.0*  MCV 91.7 91.2 90.7  PLT 220 171 185   Cardiac Enzymes:  Recent Labs  07/20/12 1510  07/21/12 1020  TROPONINI <0.30 <0.30   BNP: No results found for this basename: PROBNP,  in the last 72 hours D-Dimer: No results found for this basename: DDIMER,  in the last 72 hours CBG: No results found for this basename: GLUCAP,  in the last 72 hours Hemoglobin A1C: No results found for this basename: HGBA1C,  in the last 72 hours Fasting Lipid Panel: No results found for this basename: CHOL, HDL, LDLCALC, TRIG, CHOLHDL, LDLDIRECT,  in the last 72 hours Thyroid Function Tests:  Recent Labs  07/21/12 1020  TSH 3.130   Anemia Panel: No results found for this basename: VITAMINB12, FOLATE, FERRITIN, TIBC, IRON, RETICCTPCT,  in the last 72 hours Coagulation:  Recent Labs  07/20/12 1820 07/22/12 0605  LABPROT 13.9 15.2  INR 1.08 1.22   Urine Drug Screen: Drugs of Abuse  No results found for this basename: labopia,  cocainscrnur,  labbenz,  amphetmu,  thcu,  labbarb    Alcohol Level: No results found for this basename: ETH,  in the last 72 hours Urinalysis:  Recent Labs  07/20/12 2231 07/21/12 1145  COLORURINE AMBER* YELLOW  LABSPEC 1.025 1.016  PHURINE 6.0 6.0  GLUCOSEU NEGATIVE NEGATIVE  HGBUR LARGE* LARGE*  BILIRUBINUR NEGATIVE NEGATIVE  KETONESUR 15* 40*  PROTEINUR 30* NEGATIVE  UROBILINOGEN 1.0 2.0*  NITRITE NEGATIVE NEGATIVE  LEUKOCYTESUR NEGATIVE NEGATIVE   Misc. Labs:   Micro: No results found for this or any previous visit (from the past 240 hour(s)).  Studies/Results: Dg Tibia/fibula Right  07/21/2012  *RADIOLOGY REPORT*  Clinical Data:ORIF.  Leg fracture.  DG C-ARM 61-120 MIN,RIGHT TIBIA AND FIBULA - 2 VIEW  Fluoroscopy Time: 2 minutes 23 seconds  Comparison: None.  Findings: There is a fluoroscopic spot films demonstrate an antegrade tibial nail with proximal and distal interlocking screws. No complicating features are identified.  IMPRESSION: ORIF of the right tibia fracture.   Original Report Authenticated By: Andreas Newport, M.D.    Dg  Chest Port 1 View  07/21/2012  *RADIOLOGY REPORT*  Clinical Data: Fever.  Current history of hypertension. Preoperative evaluation prior to orthopedic surgery.  PORTABLE CHEST - 1 VIEW 07/21/2012 1305 hours:  Comparison: Two-view chest x-ray 03/22/2007, 10/29/2005.  Findings: Cardiac silhouette mildly enlarged allowing for the AP portable technique, but has increased in size since 2008.  Hilar and mediastinal contours otherwise unremarkable.  Lungs clear. Bronchovascular markings normal.  Pulmonary vascularity normal.  No pneumothorax.  No pleural effusions.  IMPRESSION: Mild cardiomegaly, with interval increase in heart size since 2008. No acute cardiopulmonary disease.   Original Report Authenticated By: Hulan Saas, M.D.    Dg Tibia/fibula Right Port  07/21/2012  *RADIOLOGY REPORT*  Clinical Data: Postop ORIF right tibia fracture.  PORTABLE RIGHT TIBIA AND FIBULA - 2 VIEW  Comparison: Intraoperative right tib-fib x-rays earlier same date.  Findings: ORIF of the comminuted fracture involving the distal tibial metaphysis with an intramedullary nail in place.  Near anatomic alignment.  Fractures involving the proximal, mid, and distal fibula also noted.  IMPRESSION: Near anatomic alignment post ORIF of the comminuted distal tibial fracture with intramedullary nail.   Original Report Authenticated By: Hulan Saas, M.D.    Dg C-arm [phone] Min  07/21/2012  *RADIOLOGY REPORT*  Clinical Data:ORIF.  Leg fracture.  DG C-ARM 61-120 MIN,RIGHT TIBIA AND FIBULA - 2 VIEW  Fluoroscopy Time: 2 minutes 23 seconds  Comparison: None.  Findings: There is a fluoroscopic spot films demonstrate an antegrade tibial nail with proximal and distal interlocking screws. No complicating features are identified.  IMPRESSION: ORIF of the right tibia fracture.   Original Report Authenticated By: Andreas Newport, M.D.     Medications:  Scheduled: . docusate sodium  100 mg Oral BID  . enoxaparin (LOVENOX) injection  40 mg  Subcutaneous Q24H  . metoprolol tartrate  100 mg Oral BID  . multivitamins with iron  1 tablet Oral Daily  . senna  1 tablet Oral BID  . thiamine  100 mg Oral Daily  . warfarin  5 mg Oral ONCE-1800  . warfarin   Does not apply Once  . Warfarin - Pharmacist Dosing Inpatient   Does not apply q1800   Continuous: . 0.45 % NaCl with KCl 20 mEq / L 75 mL/hr at 07/22/12 0000   JWJ:XBJYNWGNFAOZH, acetaminophen, alum & mag hydroxide-simeth, bisacodyl, hydrALAZINE, LORazepam, menthol-cetylpyridinium, metoCLOPramide (REGLAN) injection, metoCLOPramide, morphine injection, ondansetron (ZOFRAN) IV, ondansetron, oxyCODONE, phenol, polyethylene glycol  Assessment: Principal Problem:   Syncope Active Problems:   Tibia/fibula fracture   HYPERLIPIDEMIA   HYPERTENSION   OSTEOARTHRITIS   A-fib   Heavy alcohol consumption   Fever  1. Syncope. Given his history provided including not eating or drinking anything on the morning of the episode coupled with taking over-the-counter decongestant/cold medication coupled with walking over a mile which she is not used to, the likely etiology is multifactorial including side effect of medication, vasovagal event, and/or orthostatic hypotension. His initial cardiac enzymes are negative. 2-D echocardiogram has been ordered. His TSH is within normal limits. He is neurologically intact.  Chronic atrial fibrillation. He is treated chronically with aspirin and metoprolol. Currently stable.  Hypertension. Currently controlled, continue metoprolol.  Right tibia/fibular fracture. Postoperative day #1, status post operative repair by Dr. Dion Saucier. DVT prophylaxis ordered. Physical therapy recommends home health with 24-hour supervision or skilled nursing facility placement. This will be discussed with the family.  Fever. Resolved for now. Etiology unknown. His followup urinalysis is not indicative of infection. His chest x-ray reveals no acute findings.  We'll order blood  cultures if he spikes again.  Heavy alcohol consumption. The patient denies this. No evidence of alcohol withdrawal syndrome. Continue as needed lorazepam, thiamine, and multivitamin.  Mild hyperbilirubinemia.  Plan:  1. Continue postoperative care and therapy. 2. Query disposition: Home versus skilled nursing facility. We'll need to discuss further with family. 3. We'll check the results of the 2-D echocardiogram when available. 4. We'll check liver transaminases in the morning to assess resolution of mild hyperbilirubinemia.   LOS: 2 days   Labrian Torregrossa 07/22/2012, 10:40 AM

## 2012-07-23 LAB — CBC
MCH: 32 pg (ref 26.0–34.0)
MCHC: 35.4 g/dL (ref 30.0–36.0)
Platelets: 197 10*3/uL (ref 150–400)
RBC: 3.88 MIL/uL — ABNORMAL LOW (ref 4.22–5.81)

## 2012-07-23 LAB — COMPREHENSIVE METABOLIC PANEL
ALT: 15 U/L (ref 0–53)
AST: 25 U/L (ref 0–37)
Alkaline Phosphatase: 67 U/L (ref 39–117)
CO2: 30 mEq/L (ref 19–32)
Calcium: 8.9 mg/dL (ref 8.4–10.5)
Potassium: 3.7 mEq/L (ref 3.5–5.1)
Sodium: 133 mEq/L — ABNORMAL LOW (ref 135–145)
Total Protein: 6.8 g/dL (ref 6.0–8.3)

## 2012-07-23 LAB — PROTIME-INR
INR: 1.37 (ref 0.00–1.49)
Prothrombin Time: 16.5 seconds — ABNORMAL HIGH (ref 11.6–15.2)

## 2012-07-23 MED ORDER — WARFARIN SODIUM 5 MG PO TABS
5.0000 mg | ORAL_TABLET | Freq: Once | ORAL | Status: DC
  Filled 2012-07-23: qty 1

## 2012-07-23 MED ORDER — ENOXAPARIN SODIUM 40 MG/0.4ML ~~LOC~~ SOLN: 40.0000 mg | SUBCUTANEOUS | Status: DC

## 2012-07-23 MED ORDER — POLYETHYLENE GLYCOL 3350 17 G PO PACK
17.0000 g | PACK | Freq: Every day | ORAL | Status: DC
  Administered 2012-07-23: 17 g via ORAL
  Filled 2012-07-23: qty 1

## 2012-07-23 MED ORDER — OXYCODONE HCL 5 MG PO TABS: 5.0000 mg | ORAL_TABLET | ORAL | Status: DC | PRN

## 2012-07-23 MED ORDER — TAB-A-VITE/IRON PO TABS: 1.0000 | ORAL_TABLET | Freq: Every day | ORAL | Status: DC

## 2012-07-23 MED ORDER — POLYETHYLENE GLYCOL 3350 17 G PO PACK: 17.0000 g | PACK | Freq: Every day | ORAL | Status: DC

## 2012-07-23 NOTE — Progress Notes (Signed)
Clinical Social Work Department CLINICAL SOCIAL WORK PLACEMENT NOTE 07/23/2012  Patient:  Martin Conner,Martin Conner  Account Number:  [id number] Admit date:  07/20/2012  Clinical Social Worker:  Adean Milosevic, LCSWA  Date/time:  07/23/2012 01:44 PM  Clinical Social Work is seeking post-discharge placement for this patient at the following level of care:   SKILLED NURSING   (*CSW will update this form in Epic as items are completed)   07/23/2012  Patient/family provided with [facility] Department of Clinical Social Work's list of facilities offering this level of care within the geographic area requested by the patient (or if unable, by the patient's family).  07/23/2012  Patient/family informed of their freedom to choose among providers that offer the needed level of care, that participate in Medicare, Medicaid or managed care program needed by the patient, have an available bed and are willing to accept the patient.  07/23/2012  Patient/family informed of [facility]' ownership interest in [facility], as well as of the fact that they are under no obligation to receive care at this facility.  PASARR submitted to EDS on 07/21/2012 PASARR number received from [employer] on 07/21/2012  FL2 transmitted to all facilities in geographic area requested by pt/family on  07/21/2012 FL2 transmitted to all facilities within larger geographic area on 07/21/2012  Patient informed that his/her managed care company has contracts with or will negotiate with  certain facilities, including the following:     Patient/family informed of bed offers received:  07/22/2012 Patient chooses bed at [name] Physician recommends and patient chooses bed at    Patient to be transferred to [name] on  07/23/2012 Patient to be transferred to facility by [facility]  Herberth Deharo, LCSW-A Clinical Social Worker [phone]   

## 2012-07-23 NOTE — Care Management Note (Addendum)
  Page 1 of 1   07/23/2012     9:38:42 AM   CARE MANAGEMENT NOTE 07/23/2012  Patient:  Martin Conner, Martin Conner   Account Number:  [id number]  Date Initiated:  07/23/2012  Documentation initiated by:  GRAVES-BIGELOW,Raine Blodgett  Subjective/Objective Assessment:   Pt admitted with syncope and fall. Pt was from home with wife. Both agreeable for SNF.     Action/Plan:   [facility] CS[facility] bed is available at [facility]. No further needs from CM at this time.   Anticipated DC Date:  07/23/2012   Anticipated DC Plan:  SKILLED NURSING FACILITY  In-house referral  Clinical Social Worker      DC Planning Services  CM consult      Choice offered to / List presented to:             Status of service:  Completed, signed off Medicare Important Message given?   (If response is "NO", the following Medicare IM given date fields will be blank) Date Medicare IM given:   Date Additional Medicare IM given:    Discharge Disposition:  SKILLED NURSING FACILITY  Per UR Regulation:  Reviewed for med. necessity/level of care/duration of stay  If discussed at Long Length of Stay Meetings, dates discussed:    Comments:

## 2012-07-23 NOTE — Progress Notes (Signed)
Physical Therapy Treatment Patient Details Name: Martin Conner MRN: [id number] DOB: 03/15/1928 Today's Date: 07/23/2012 Time: 7829-5621 PT Time Calculation (min): 23 min  PT Assessment / Plan / Recommendation Comments on Treatment Session  Pt making great progress with mobility at this date.  Per chart review & pt, plans are for d/c to [facility] when medically ready.      Follow Up Recommendations  SNF     Does the patient have the potential to tolerate intense rehabilitation     Barriers to Discharge        Equipment Recommendations  Rolling walker with 5" wheels    Recommendations for Other Services    Frequency Min 5X/week   Plan      Precautions / Restrictions Precautions Precautions: Fall Restrictions RLE Weight Bearing: Partial weight bearing       Mobility  Bed Mobility Bed Mobility: Supine to Sit;Sitting - Scoot to Edge of Bed Supine to Sit: 6: Modified independent (Device/Increase time);HOB flat;With rails Sitting - Scoot to Edge of Bed: 6: Modified independent (Device/Increase time) Transfers Transfers: Sit to Stand;Stand to Sit Sit to Stand: 4: Min assist;With upper extremity assist;From bed Stand to Sit: 4: Min assist;With upper extremity assist;With armrests;To chair/3-in-1 Details for Transfer Assistance: Cues for R LE positioning, hand placement, & technique.  (A) to achieve standing, balance, & controlled descent.   Ambulation/Gait Ambulation/Gait Assistance: 4: Min guard Ambulation Distance (Feet): 50 Feet Assistive device: Rolling walker Ambulation/Gait Assistance Details: Cues to reinforce PWB, sequencing, & safe body positioning inside RW.   Gait Pattern: Step-through pattern;Decreased stance time - right;Decreased weight shift to right    Exercises General Exercises - Lower Extremity Hip ABduction/ADduction: AROM;Strengthening;Right;10 reps Straight Leg Raises: AROM;Right;10 reps;Strengthening     PT Goals Acute Rehab PT Goals Time For Goal  Achievement: 07/29/12 Potential to Achieve Goals: Good Pt will go Supine/Side to Sit: with modified independence PT Goal: Supine/Side to Sit - Progress: Met Pt will go Sit to Supine/Side: with modified independence Pt will go Sit to Stand: with modified independence PT Goal: Sit to Stand - Progress: Progressing toward goal Pt will go Stand to Sit: with modified independence PT Goal: Stand to Sit - Progress: Progressing toward goal Pt will Stand: with modified independence;with unilateral upper extremity support;3 - 5 min Pt will Ambulate: 51 - 150 feet;with supervision;with rolling walker PT Goal: Ambulate - Progress: Progressing toward goal Pt will Go Up / Down Stairs: 3-5 stairs;with min assist;with rolling walker Pt will Perform Home Exercise Program: with supervision, verbal cues required/provided PT Goal: Perform Home Exercise Program - Progress: Progressing toward goal  Visit Information  Last PT Received On: 07/23/12 Assistance Needed: +1    Subjective Data      Cognition  Cognition Arousal/Alertness: Awake/alert Behavior During Therapy: WFL for tasks assessed/performed Overall Cognitive Status: Within Functional Limits for tasks assessed    Balance     End of Session PT - End of Session Equipment Utilized During Treatment: Gait belt Activity Tolerance: Patient tolerated treatment well Patient left: in chair;with call bell/phone within reach;with family/visitor present Nurse Communication: Mobility status     Verdell Face, [place] [phone] 07/23/2012

## 2012-07-23 NOTE — Discharge Summary (Signed)
Physician Discharge Summary  Martin Conner AVW:[id number] DOB: [birth year] DOA: 07/20/2012  PCP: Tillman Abide, MD  Admit date: 07/20/2012 Discharge date: 07/23/2012  Time spent: Greater than 30 minutes  Recommendations for Outpatient Follow-up:  1. The right leg splint will stay on at all times until he is reevaluated by Dr. Dion Saucier. 2. Coumadin dosing should be per pharmacy and/or per physician at the skilled nursing facility. Continue Coumadin for a total of 4 weeks. Continue Lovenox until the patient's INR is between 2 and 3. His INR should be monitored at least twice weekly or more frequent. When his INR is between 2-3, Lovenox can be discontinued. 3. Activity: Toe touch weightbearing on the right lower extremity. 4. The patient should followup with Dr. Dion Saucier in 2 weeks. 5. When Coumadin is discontinued after 4 weeks, resume aspirin at 81 mg daily.  Discharge Diagnoses:   1. Syncope resulting in fall. Etiology likely multifactorial including not eating or drinking on the morning of episode coupled with possible side effect from decongestant/cold medication. 2. Acute right tibia/fibula fracture secondary to fall. 3. Status post ORIF/intramedullary nail of the tibia, per Dr. Dion Saucier on 07/21/2012. 4. Hypertension. 5. History of heavy alcohol consumption. No evidence of alcohol withdrawal syndrome during hospitalization. 6. Chronic atrophic fibrillation, not on chronic Coumadin. Was treated with aspirin. Aspirin can be restarted Coumadin course has been completed in 4 weeks. 7. Fever. No etiology identified. 8. Mild hyperbilirubinemia without evidence of transaminitis or abdominal tenderness.  Discharge Condition: Improved.  Diet recommendation: Heart healthy.  Filed Weights   07/21/12 0027 07/22/12 0556 07/23/12 0455  Weight: 87.091 kg (192 lb) 90.946 kg (200 lb 8 oz) 90.946 kg (200 lb 8 oz)    History of present illness:  The patient is an 79 year old man with a history significant for  hypertension and atrial fibrillation (not on Coumadin chronically), who presented to the emergency department on 07/20/2012 after passing out at home. He was at his [place] home at the time. He was walking on the beach for approximately one mile. He had not eaten or drunk any liquids that morning. In addition, he took a decongestant/cold medication prior to his walk. On the way back home, he became dizzy and completely passed out losing consciousness. He fell on his right side. When he came to, he complained of right leg pain. In the emergency department, he was hypertensive, otherwise hemodynamically stable and afebrile. His EKG revealed atrial fibrillation, rate controlled. His lab data were significant for a normal troponin I., total bilirubin of 1.7, and WBC of 12.3. His urinalysis was not suggestive of infection. X-ray of his right lower extremity revealed fracture of the tibia/fibular. He was admitted for further evaluation and management.  Hospital Course:  The patient was started on gentle IV fluids. Analgesics were ordered as needed for pain. Dr. Dion Saucier was consulted and ordered a splint for immobilization in anticipation of surgical repair the next day. For further evaluation, cardiac enzymes were ordered. All of his cardiac enzymes were well within normal limits. His TSH was within normal limits. His 2-D echocardiogram revealed preserved LV systolic function with an ejection fraction of 55-60%. There was no regional wall motion abnormalities.  The patient was taken to the OR by Dr. Dion Saucier on 07/21/2012. He performed operative repair of the right leg fracture. Following the operation, the patient was started on prophylactic Lovenox and Coumadin. Physical therapy was consulted. They evaluated the patient and recommended further rehabilitation and strengthening and a skilled environment. The patient  and his wife agreed. Dr. Dion Saucier recommended toe-touch weightbearing on the right lower extremity. He  recommended keeping the splint on until the patient is reevaluated by him in 2 weeks. The Lovenox should continue until the patient's INR is between 2-3. Coumadin should be continued for a total of 4 weeks.  The patient remained in atrial fibrillation. His rate was well controlled. His blood pressures improved, but was still modestly elevated. Metoprolol was restarted and continued.  The patient developed a low-grade fever prior to and following. A followup urinalysis revealed no bacteria and no white blood cells. His chest x-ray revealed no acute cardiopulmonary findings. His fever eventually resolved. His white blood cell count normalized without treatment with antibiotics.  The patient was encouraged to decrease his alcohol intake. He was started on vitamin therapy with a multivitamin with iron and thiamine throughout the hospitalization. There were no signs or symptoms consistent with alcohol withdrawal syndrome. He was noted to have normal liver transaminases, but he did have asymptomatic hyperbilirubinemia with a total bilirubin ranging from 1.7-2.1. He had no abdominal tenderness on exam. This could be followed electively in the outpatient setting.  The patient remained alert and oriented throughout the hospitalization. There was no complaint or evidence of presyncopal or syncopal symptoms at all. It is likely that his syncopal event at home was due to a combination of not eating/drinking and decongestant/cold medication.  Procedures:  ORIF/intramedullary nail right tibia, Dr. Dion Saucier.  2-D echocardiogram:Study Conclusions  - Left ventricle: The cavity size was normal. There was mild focal basal hypertrophy of the septum. Systolic function was normal. The estimated ejection fraction was in the range of 55% to 60%. Wall motion was normal; there were no regional wall motion abnormalities. - Left atrium: The atrium was mildly dilated. - Right atrium: The atrium was mildly  dilated. Transthoracic echocardiography. M-mode, complete 2D, spectral Doppler, and color Doppler. Height: Height: 177.8cm. Height: 70in. Weight: Weight: 90.9kg. Weight: 200lb. Body mass index: BMI: 28.8kg/m^2. Body surface area: BSA: 2.68m^2. Blood pressure: 126/76. Patient status: Inpatient. Location: Bedside.     Consultations:  Orthopedic surgeon, Hardin Negus, M.D.  Discharge Exam: Filed Vitals:   07/23/12 0400 07/23/12 0455 07/23/12 1039 07/23/12 1116  BP:  144/77 169/91   Pulse:  98 96   Temp:  99.5 F (37.5 C)  98.9 F (37.2 C)  TempSrc:  Oral  Oral  Resp: 17     Height:      Weight:  90.946 kg (200 lb 8 oz)    SpO2: 99% 100%      General: Pleasant alert 79 year old Caucasian man laying in bed, in no acute distress. Cardiovascular: Irregular, irregular. Respiratory: Clear to auscultation bilaterally. Extremities: Splint and bandage in place on the right lower extremity. The patient is able to wiggle his toes. Neurologic: He is alert and oriented x3. Cranial nerves II through XII are intact.  Discharge Instructions  Discharge Orders   Future Appointments Provider Department Dept Phone   08/24/2012 8:15 AM Karie Schwalbe, MD [facility] [phone]   Future Orders Complete By Expires     Diet - low sodium heart healthy  As directed     Discharge instructions  As directed     Comments:      The patient was advised to followup with Dr. Dion Saucier in 2 weeks.    Discharge wound care:  As directed     Comments:      Leave right leg splint on at all  times, per Dr. Dion Saucier.    Increase activity slowly  As directed     Scheduling Instructions:      Toe touch weightbearing on the right leg.    Touch down weight bearing  As directed         Medication List    STOP taking these medications       aspirin 81 MG tablet      TAKE these medications       enoxaparin 40 MG/0.4ML injection  Commonly known as:  LOVENOX  Inject 0.4 mLs (40 mg  total) into the skin daily. Continue for a total of 4 weeks.     metoprolol 100 MG tablet  Commonly known as:  LOPRESSOR  Take 100 mg by mouth 2 (two) times daily.     multivitamins with iron Tabs  Take 1 tablet by mouth daily.     oxyCODONE 5 MG immediate release tablet  Commonly known as:  Oxy IR/ROXICODONE  Take 1 tablet (5 mg total) by mouth every 4 (four) hours as needed.     polyethylene glycol packet  Commonly known as:  MIRALAX / GLYCOLAX  Take 17 g by mouth daily.     sennosides-docusate sodium 8.6-50 MG tablet  Commonly known as:  SENOKOT-S  Take 1 tablet by mouth daily.     warfarin 5 MG tablet  Commonly known as:  COUMADIN  Take 1 tablet (5 mg total) by mouth daily.       No Known Allergies     Follow-up Information   Follow up with Eulas Post, MD. Schedule an appointment as soon as possible for a visit in 2 weeks.   Contact information:   [address] [place] [address] [phone]        The results of significant diagnostics from this hospitalization (including imaging, microbiology, ancillary and laboratory) are listed below for reference.    Significant Diagnostic Studies: Dg Tibia/fibula Right  07/21/2012  *RADIOLOGY REPORT*  Clinical Data:ORIF.  Leg fracture.  DG C-ARM 61-120 MIN,RIGHT TIBIA AND FIBULA - 2 VIEW  Fluoroscopy Time: 2 minutes 23 seconds  Comparison: None.  Findings: There is a fluoroscopic spot films demonstrate an antegrade tibial nail with proximal and distal interlocking screws. No complicating features are identified.  IMPRESSION: ORIF of the right tibia fracture.   Original Report Authenticated By: Andreas Newport, M.D.    Dg Chest Port 1 View  07/21/2012  *RADIOLOGY REPORT*  Clinical Data: Fever.  Current history of hypertension. Preoperative evaluation prior to orthopedic surgery.  PORTABLE CHEST - 1 VIEW 07/21/2012 1305 hours:  Comparison: Two-view chest x-ray 03/22/2007, 10/29/2005.  Findings: Cardiac  silhouette mildly enlarged allowing for the AP portable technique, but has increased in size since 2008.  Hilar and mediastinal contours otherwise unremarkable.  Lungs clear. Bronchovascular markings normal.  Pulmonary vascularity normal.  No pneumothorax.  No pleural effusions.  IMPRESSION: Mild cardiomegaly, with interval increase in heart size since 2008. No acute cardiopulmonary disease.   Original Report Authenticated By: Hulan Saas, M.D.    Dg Tibia/fibula Right Port  07/21/2012  *RADIOLOGY REPORT*  Clinical Data: Postop ORIF right tibia fracture.  PORTABLE RIGHT TIBIA AND FIBULA - 2 VIEW  Comparison: Intraoperative right tib-fib x-rays earlier same date.  Findings: ORIF of the comminuted fracture involving the distal tibial metaphysis with an intramedullary nail in place.  Near anatomic alignment.  Fractures involving the proximal, mid, and distal fibula also noted.  IMPRESSION: Near anatomic alignment post  ORIF of the comminuted distal tibial fracture with intramedullary nail.   Original Report Authenticated By: Hulan Saas, M.D.    Dg C-arm [phone] Min  07/21/2012  *RADIOLOGY REPORT*  Clinical Data:ORIF.  Leg fracture.  DG C-ARM 61-120 MIN,RIGHT TIBIA AND FIBULA - 2 VIEW  Fluoroscopy Time: 2 minutes 23 seconds  Comparison: None.  Findings: There is a fluoroscopic spot films demonstrate an antegrade tibial nail with proximal and distal interlocking screws. No complicating features are identified.  IMPRESSION: ORIF of the right tibia fracture.   Original Report Authenticated By: Andreas Newport, M.D.     Microbiology: Recent Results (from the past 240 hour(s))  URINE CULTURE     Status: None   Collection Time    07/21/12 11:45 AM      Result Value Range Status   Specimen Description URINE, CLEAN CATCH   Final   Special Requests NONE   Final   Culture  Setup Time 07/21/2012 16:23   Final   Colony Count 4,000 COLONIES/ML   Final   Culture INSIGNIFICANT GROWTH   Final   Report Status  07/22/2012 FINAL   Final     Labs: Basic Metabolic Panel:  Recent Labs Lab 07/20/12 1510 07/21/12 2133 07/22/12 0605 07/23/12 0645  NA 137  --  136 133*  K 4.2  --  4.1 3.7  CL 100  --  99 95*  CO2 26  --  25 30  GLUCOSE 87  --  89 107*  BUN 15  --  11 13  CREATININE 0.87 0.85 0.89 0.90  CALCIUM 9.5  --  8.8 8.9   Liver Function Tests:  Recent Labs Lab 07/20/12 1820 07/23/12 0645  AST 19 25  ALT 13 15  ALKPHOS 74 67  BILITOT 1.7* 2.1*  PROT 7.3 6.8  ALBUMIN 3.4* 2.9*   No results found for this basename: LIPASE, AMYLASE,  in the last 168 hours No results found for this basename: AMMONIA,  in the last 168 hours CBC:  Recent Labs Lab 07/20/12 1510 07/21/12 2133 07/22/12 0605 07/23/12 0645  WBC 12.3* 12.0* 10.7* 10.5  NEUTROABS 8.9*  --   --   --   HGB 14.4 13.1 12.2* 12.4*  HCT 41.8 37.4* 35.0* 35.0*  MCV 91.7 91.2 90.7 90.2  PLT 220 171 185 197   Cardiac Enzymes:  Recent Labs Lab 07/20/12 1510 07/21/12 1020  TROPONINI <0.30 <0.30   BNP: BNP (last 3 results) No results found for this basename: PROBNP,  in the last 8760 hours CBG: No results found for this basename: GLUCAP,  in the last 168 hours     Signed:  Kinlee Garrison  [facility] 07/23/2012, 1:41 PM

## 2012-07-23 NOTE — Progress Notes (Signed)
ANTICOAGULATION CONSULT NOTE - Follow Up Consult  Pharmacy Consult for Coumadin Indication: VTE prophylaxis  No Known Allergies  Patient Measurements: Height: 5\' 10"  (177.8 cm) Weight: 200 lb 8 oz (90.946 kg) IBW/kg (Calculated) : 73 Heparin Dosing Weight:   Vital Signs: Temp: 99.5 F (37.5 C) (05/02 0455) Temp src: Oral (05/02 0455) BP: 144/77 mmHg (05/02 0455) Pulse Rate: 98 (05/02 0455)  Labs:  Recent Labs  07/20/12 1510 07/20/12 1820 07/21/12 1020 07/21/12 2133 07/22/12 0605 07/23/12 0645  HGB 14.4  --   --  13.1 12.2* 12.4*  HCT 41.8  --   --  37.4* 35.0* 35.0*  PLT 220  --   --  171 185 197  LABPROT  --  13.9  --   --  15.2 16.5*  INR  --  1.08  --   --  1.22 1.37  CREATININE 0.87  --   --  0.85 0.89 0.90  TROPONINI <0.30  --  <0.30  --   --   --     Estimated Creatinine Clearance: 71.8 ml/min (by C-G formula based on Cr of 0.9).  Assessment: 84yom on Coumadin for VTE prophylaxis s/p R tibia and fibula fracture repair. INR (1.37) is subtherapeutic as expected but is trending up - will repeat Coumadin 5mg  dose. Patient is also receiving Lovenox until INR >1.8 per MD orders.  - H/H and Plts improving - No significant bleeding reported  Goal of Therapy:  INR 2-3   Plan:  1. Repeat Coumadin 5mg  po x 1 today 2. Follow-up AM INR  Cleon Dew [phone] 07/23/2012,10:17 AM

## 2012-07-23 NOTE — Progress Notes (Signed)
Clinical Social Work Department BRIEF PSYCHOSOCIAL ASSESSMENT 07/23/2012  Patient:  Martin Conner, Martin Conner     Account Number:  [id number]     Admit date:  07/20/2012  Clinical Social Worker:  Kirke Shaggy  Date/Time:  07/23/2012 10:40 AM  Referred by:  Physician  Date Referred:  07/21/2012 Referred for  SNF Placement   Other Referral:   Interview type:  Patient Other interview type:   AND CSW SPOKE TO WIFE Martin Conner.    PSYCHOSOCIAL DATA Living Status:  FAMILY Admitted from facility:   Level of care:   Primary support name:  Martin Conner Shipes Primary support relationship to patient:  SPOUSE Degree of support available:   GOOD.    CURRENT CONCERNS Current Concerns  None Noted   Other Concerns:    SOCIAL WORK ASSESSMENT / PLAN CSW MET WITH THE PT AND WIFE IN THE PT'S ROOM TO DISCUSS SNF PLACEMENT FOR CONTINUED PT REHAB. BED REFERRAL FOR SNF WAS SENT OUT FOR [place].   Assessment/plan status:   Other assessment/ plan:   Information/referral to community resources:   LIST OF SNF'S WERE GIVEN TO THE PT    PATIENT'S/FAMILY'S RESPONSE TO PLAN OF CARE: Pt and wife are fine with the pt going to [facility].   Sherald Barge, LCSW-A Clinical Social Worker [phone]

## 2012-07-23 NOTE — Progress Notes (Addendum)
Patient ID: Martin Conner, male   DOB: 1927-09-15, 79 y.o.   MRN: [id number]     Subjective:  Patient reports pain as mild.  He states that he has only mild pain and is able to transfer from bed to chair without any problems.   Objective:   VITALS:   Filed Vitals:   07/22/12 2300 07/23/12 0000 07/23/12 0400 07/23/12 0455  BP: 155/77   144/77  Pulse: 89   98  Temp: 100.5 F (38.1 C) 99.1 F (37.3 C)  99.5 F (37.5 C)  TempSrc: Oral Axillary  Oral  Resp:  19 17   Height:      Weight:    90.946 kg (200 lb 8 oz)  SpO2: 98% 99% 99% 100%    ABD soft Sensation intact distally Dorsiflexion/Plantar flexion intact Incision: dressing C/D/I and no drainage   Lab Results  Component Value Date   WBC 10.5 07/23/2012   HGB 12.4* 07/23/2012   HCT 35.0* 07/23/2012   MCV 90.2 07/23/2012   PLT 197 07/23/2012     Assessment/Plan: 2 Days Post-Op   Principal Problem:   Syncope Active Problems:   HYPERLIPIDEMIA   HYPERTENSION   OSTEOARTHRITIS   A-fib   Tibia/fibula fracture   Heavy alcohol consumption   Fever   Advance diet Up with therapy Continue TTWB on the right lower ext. Continue plan per medicine   Haskel Khan 07/23/2012, 9:39 AM   Teryl Lucy, MD Cell [phone]    Okay for SNF placement when appropriate with Medical service.

## 2012-07-23 NOTE — Progress Notes (Signed)
Clinical Social Work Department CLINICAL SOCIAL WORK PLACEMENT NOTE 07/23/2012  Patient:  Martin Conner, Martin Conner  Account Number:  [id number] Admit date:  07/20/2012  Clinical Social Worker:  Sherald Barge, Theresia Majors  Date/time:  07/23/2012 01:44 PM  Clinical Social Work is seeking post-discharge placement for this patient at the following level of care:   SKILLED NURSING   (*CSW will update this form in Epic as items are completed)   07/23/2012  Patient/family provided with [facility] Department of Clinical Social Work's list of facilities offering this level of care within the geographic area requested by the patient (or if unable, by the patient's family).  07/23/2012  Patient/family informed of their freedom to choose among providers that offer the needed level of care, that participate in Medicare, Medicaid or managed care program needed by the patient, have an available bed and are willing to accept the patient.  07/23/2012  Patient/family informed of [facility]' ownership interest in [facility], as well as of the fact that they are under no obligation to receive care at this facility.  PASARR submitted to EDS on 07/21/2012 PASARR number received from [employer]DS on 07/21/2012  FL2 transmitted to all facilities in geographic area requested by pt/family on  07/21/2012 FL2 transmitted to all facilities within larger geographic area on 07/21/2012  Patient informed that his/her managed care company has contracts with or will negotiate with  certain facilities, including the following:     Patient/family informed of bed offers received:  07/22/2012 Patient chooses bed at [facility] Physician recommends and patient chooses bed at    Patient to be transferred to [facility] on  07/23/2012 Patient to be transferred to facility by [facility] ambulance  Sherald Barge, LCSW-A Clinical Social Worker [phone]

## 2012-07-26 ENCOUNTER — Encounter (HOSPITAL_COMMUNITY): Payer: Self-pay | Admitting: Orthopedic Surgery

## 2012-07-27 ENCOUNTER — Non-Acute Institutional Stay (SKILLED_NURSING_FACILITY): Payer: Medicare Other | Admitting: Internal Medicine

## 2012-07-27 DIAGNOSIS — S8290XS Unspecified fracture of unspecified lower leg, sequela: Secondary | ICD-10-CM

## 2012-07-27 DIAGNOSIS — J209 Acute bronchitis, unspecified: Secondary | ICD-10-CM

## 2012-07-27 DIAGNOSIS — S82201S Unspecified fracture of shaft of right tibia, sequela: Secondary | ICD-10-CM

## 2012-07-27 DIAGNOSIS — I4891 Unspecified atrial fibrillation: Secondary | ICD-10-CM

## 2012-07-27 DIAGNOSIS — I10 Essential (primary) hypertension: Secondary | ICD-10-CM

## 2012-07-30 ENCOUNTER — Non-Acute Institutional Stay (SKILLED_NURSING_FACILITY): Payer: Medicare Other | Admitting: Adult Health

## 2012-07-30 DIAGNOSIS — Z7901 Long term (current) use of anticoagulants: Secondary | ICD-10-CM

## 2012-07-30 DIAGNOSIS — I4891 Unspecified atrial fibrillation: Secondary | ICD-10-CM

## 2012-07-30 DIAGNOSIS — S82201D Unspecified fracture of shaft of right tibia, subsequent encounter for closed fracture with routine healing: Secondary | ICD-10-CM

## 2012-07-30 DIAGNOSIS — S8290XD Unspecified fracture of unspecified lower leg, subsequent encounter for closed fracture with routine healing: Secondary | ICD-10-CM

## 2012-08-03 ENCOUNTER — Non-Acute Institutional Stay (SKILLED_NURSING_FACILITY): Payer: Medicare Other | Admitting: Adult Health

## 2012-08-03 DIAGNOSIS — I4891 Unspecified atrial fibrillation: Secondary | ICD-10-CM

## 2012-08-03 DIAGNOSIS — S8290XD Unspecified fracture of unspecified lower leg, subsequent encounter for closed fracture with routine healing: Secondary | ICD-10-CM

## 2012-08-03 DIAGNOSIS — S82201D Unspecified fracture of shaft of right tibia, subsequent encounter for closed fracture with routine healing: Secondary | ICD-10-CM

## 2012-08-03 DIAGNOSIS — Z7901 Long term (current) use of anticoagulants: Secondary | ICD-10-CM

## 2012-08-05 ENCOUNTER — Non-Acute Institutional Stay (SKILLED_NURSING_FACILITY): Payer: Medicare Other | Admitting: Adult Health

## 2012-08-05 DIAGNOSIS — K5900 Constipation, unspecified: Secondary | ICD-10-CM

## 2012-08-05 DIAGNOSIS — S82201D Unspecified fracture of shaft of right tibia, subsequent encounter for closed fracture with routine healing: Secondary | ICD-10-CM

## 2012-08-05 DIAGNOSIS — J189 Pneumonia, unspecified organism: Secondary | ICD-10-CM

## 2012-08-05 DIAGNOSIS — S82401D Unspecified fracture of shaft of right fibula, subsequent encounter for closed fracture with routine healing: Secondary | ICD-10-CM

## 2012-08-05 DIAGNOSIS — I10 Essential (primary) hypertension: Secondary | ICD-10-CM

## 2012-08-05 DIAGNOSIS — S8290XD Unspecified fracture of unspecified lower leg, subsequent encounter for closed fracture with routine healing: Secondary | ICD-10-CM

## 2012-08-05 DIAGNOSIS — I4891 Unspecified atrial fibrillation: Secondary | ICD-10-CM

## 2012-08-10 ENCOUNTER — Encounter: Payer: Self-pay | Admitting: Adult Health

## 2012-08-10 DIAGNOSIS — Z7901 Long term (current) use of anticoagulants: Secondary | ICD-10-CM | POA: Insufficient documentation

## 2012-08-10 DIAGNOSIS — K5900 Constipation, unspecified: Secondary | ICD-10-CM | POA: Insufficient documentation

## 2012-08-10 DIAGNOSIS — J189 Pneumonia, unspecified organism: Secondary | ICD-10-CM | POA: Insufficient documentation

## 2012-08-10 NOTE — Progress Notes (Signed)
Patient ID: Martin Conner, male   DOB: 1927-05-30, 79 y.o.   MRN: [id number]  Subjective:     Indication: atrial fibrillation and DVT prophylaxis S/P ORIF IM nail right tibia Bleeding signs/symptoms: None Thromboembolic signs/symptoms: None  Missed Coumadin doses: none Medication changes: no Dietary changes: no Bacterial/viral infection: no Other concerns: no  The following portions of the patient's history were reviewed and updated as appropriate: allergies, current medications, past family history, past medical history, past social history, past surgical history and problem list.  Review of Systems A comprehensive review of systems was negative.   Objective:    INR Today: 1.7 Current dose:   Coumadin 3 mg PO Q D  Assessment:    Subtherapeutic INR for goal of 2-3   Plan:    1. New dose: continue Coumadin @ 3 mg PO Q D   2. Next INR:  08/06/12

## 2012-08-10 NOTE — Progress Notes (Signed)
Patient ID: AMUN STEMM, male   DOB: 1927-12-21, 79 y.o.   MRN: [id number] Subjective:     Indication: atrial fibrillation and DVT Bleeding signs/symptoms: None Thromboembolic signs/symptoms: None  Missed Coumadin doses: held x 3 days Medication changes: no Dietary changes: no Bacterial/viral infection: no Other concerns: no  The following portions of the patient's history were reviewed and updated as appropriate: allergies, current medications, past family history, past medical history, past social history, past surgical history and problem list.  Review of Systems A comprehensive review of systems was negative.   Objective:    INR Today: 1.4 Current dose:  held x 3 days  Assessment:    Subtherapeutic INR for goal of 2-3   Plan:    1. New dose: decrease Coumadin @ 3 mg PO Q D   2. Next INR:  08/03/12

## 2012-08-10 NOTE — Progress Notes (Signed)
  Subjective:    Patient ID: Martin Conner, male    DOB: December 01, 1927, 79 y.o.   MRN: [id number]  HPI This is an 79 year old male who is for discharge home with Home health PT, OT, CNA and Nursing. DME: Rolling walker due to unsteady gait. He has been admitted to [facility] on 07/23/12 from [facility]. He had a fall while in his beach house and sustained right tibia/fibula fracture for which he had ORIF IM nail right tibia. Patient has been admitted for a short-term rehabilitation. Patient complained of coughing with productive cough with whitish to yellowish phlegm.  Review of Systems  Constitutional: Negative.   HENT: Negative.   Eyes: Negative.   Respiratory: Positive for cough. Negative for chest tightness, shortness of breath and wheezing.   Cardiovascular: Positive for leg swelling.  Gastrointestinal: Negative.   Endocrine: Negative.   Genitourinary: Negative.   Neurological: Negative.   Hematological: Negative for adenopathy. Does not bruise/bleed easily.  Psychiatric/Behavioral: Negative.        Objective:   Physical Exam  Constitutional: He is oriented to person, place, and time. He appears well-developed and well-nourished.  HENT:  Head: Normocephalic and atraumatic.  Right Ear: External ear normal.  Left Ear: External ear normal.  Eyes: Conjunctivae and EOM are normal. Pupils are equal, round, and reactive to light.  Neck: Normal range of motion. Neck supple. No thyromegaly present.  Cardiovascular: Normal rate and normal heart sounds.   Pulmonary/Chest: Effort normal and breath sounds normal.  Abdominal: Soft. Bowel sounds are normal.  Musculoskeletal: He exhibits edema.  RLE edema, 2 +  Neurological: He is alert and oriented to person, place, and time.  Skin: Skin is warm and dry.  Psychiatric: He has a normal mood and affect. His behavior is normal. Judgment and thought content normal.   Procedure: 07/26/12  Chest x-ray shows patchy atelectasis or interstitial  pneumonitis  Medications reviewed per [facility]      Assessment & Plan:   Pneumonitis - continue Doxycycline 100 mg PO BID x 5 more days and Mucinex 600 mg PO Q 12 hours x 7 days  Unspecified constipation - no complaints of; continue Senna-S and Miralax  A-fib - rate-controlled; continue Metoprololo  Tibia/fibula fracture S/P ORIF -  Will have Home health PT, OT, CNA and Nurse follow-up  HYPERTENSION - stable; continue Lisinopril

## 2012-08-18 NOTE — Progress Notes (Signed)
Patient ID: Martin Conner, male   DOB: 1927-12-09, 79 y.o.   MRN: [id number]        HISTORY & PHYSICAL  DATE: 07/27/2012   FACILITY: [name]  LEVEL OF CARE: SNF (31)  ALLERGIES:  No Known Allergies  CHIEF COMPLAINT:  Manage right tibia/fibula fracture, hypertension, and atrial fibrillation.    HISTORY OF PRESENT ILLNESS:  The patient is an 79 year-old, Caucasian male.    RIGHT TIBIA/FIBULA FRACTURE:  Patient had a syncopal episode and fell.  He started having right lower extremity pain.  In the emergency room, x-ray of the right lower extremity revealed acute fracture of the tibia and fibula.  Patient subsequently underwent ORIF and tolerated the procedure well.  He is admitted to this facility for short-term rehabilitation.  His right lower extremity is wrapped currently.    HTN: Pt 's HTN remains stable.  Denies CP, sob, DOE, pedal edema, headaches, dizziness or visual disturbances.  No complications from the medications currently being used.  Last BP :  166/94, 155/95.  ATRIAL FIBRILLATION: the patients atrial fibrillation remains stable.  The patient denies DOE, tachycardia, orthopnea, transient neurological sx, pedal edema, palpitations, & PNDs.  No complications noted from the medications currently being used.   PAST MEDICAL HISTORY :  Past Medical History  Diagnosis Date  . Hyperlipidemia   . Hypertension   . Lumbar disc disease   . Osteoarthritis     PAST SURGICAL HISTORY: Past Surgical History  Procedure Laterality Date  . Total hip arthroplasty      left  . Ankle surgery      right ankle  . Tibia im nail insertion Right 07/21/2012    Procedure: INTRAMEDULLARY (IM) NAIL TIBIAL;  Surgeon: Eulas Post, MD;  Location: [facility] OR;  Service: Orthopedics;  Laterality: Right;    SOCIAL HISTORY:  reports that he has never smoked. He has never used smokeless tobacco. He reports that  drinks alcohol. He reports that he does not use illicit drugs.  FAMILY  HISTORY:  Family History  Problem Relation Age of Onset  . Cancer Mother   . Hyperlipidemia Sister   . Hypertension Sister     CURRENT MEDICATIONS: Reviewed per [facility]  REVIEW OF SYSTEMS:   CHEST/RESPIRATORY:  Cough productive of yellow phlegm for two days.  Denies shortness of breath.    See HPI otherwise 14 point ROS is negative.  PHYSICAL EXAMINATION  VS:  T 99.1       P 78      RR 18       BP 166/94     POX 96% room air       WT (Lb)  GENERAL: no acute distress, normal body habitus SKIN: right lower extremity wrapped, warm & dry, no suspicious lesions or rashes, no excessive dryness EYES: conjunctivae normal, sclerae normal, normal eye lids MOUTH/THROAT: lips without lesions,no lesions in the mouth,tongue is without lesions,uvula elevates in midline NECK: supple, trachea midline, no neck masses, no thyroid tenderness, no thyromegaly LYMPHATICS: no LAN in the neck, no supraclavicular LAN RESPIRATORY: breathing is even & unlabored, BS CTAB CARDIAC: RRR, no murmur,no extra heart sounds, no edema GI:  ABDOMEN: abdomen soft, normal BS, no masses, no tenderness  LIVER/SPLEEN: no hepatomegaly, no splenomegaly MUSCULOSKELETAL: HEAD: normal to inspection & palpation BACK: no kyphosis, scoliosis or spinal processes tenderness EXTREMITIES: LEFT UPPER EXTREMITY: full range of motion, normal strength & tone RIGHT UPPER EXTREMITY:  full range of motion, normal  strength & tone LEFT LOWER EXTREMITY:  full range of motion, normal strength & tone RIGHT LOWER EXTREMITY: not tested PSYCHIATRIC: the patient is alert & oriented to person, affect & behavior appropriate  LABS/RADIOLOGY: Normal troponin-I.  Urinalysis negative.   TSH normal.   2D-echo showed EF of 55-60%, no regional wall motion abnormalities.    Chest x-ray:  No acute disease.   Postoperative right tibia/fibula x-ray showed status post ORIF with near anatomic alignment.     Urine culture showed no growth.    Glucose  107, otherwise BMP normal.    Albumin 2.9, total bilirubin 2.1, otherwise liver profile normal.    Hemoglobin 12.4, MCV 90.2, otherwise CBC normal.    ASSESSMENT/PLAN:  Acute right tibia/fibula fracture.  Status post ORIF.  Continue rehabilitation.   Hypertension.  Uncontrolled.  Lisinopril 2.5 mg q.d. started.    Atrial fibrillation.  Rate controlled.   Acute bronchitis.  Doxycycline 100 mg q.d. for seven days and Mucinex 600 mg b.i.d. for one week started.    Constipation.  Adequately controlled.   Check CBC and BMP in two days.    I have reviewed patient's medical records received at admission/from hospitalization.  CPT CODE: 54098

## 2012-08-24 ENCOUNTER — Ambulatory Visit: Payer: 59 | Admitting: Internal Medicine

## 2013-02-01 ENCOUNTER — Ambulatory Visit (INDEPENDENT_AMBULATORY_CARE_PROVIDER_SITE_OTHER): Payer: Medicare Other | Admitting: Internal Medicine

## 2013-02-01 ENCOUNTER — Encounter: Payer: Self-pay | Admitting: Internal Medicine

## 2013-02-01 VITALS — BP 120/80 | HR 62 | Temp 98.0°F

## 2013-02-01 DIAGNOSIS — I4891 Unspecified atrial fibrillation: Secondary | ICD-10-CM

## 2013-02-01 DIAGNOSIS — Z23 Encounter for immunization: Secondary | ICD-10-CM

## 2013-02-01 DIAGNOSIS — I10 Essential (primary) hypertension: Secondary | ICD-10-CM

## 2013-02-01 DIAGNOSIS — E785 Hyperlipidemia, unspecified: Secondary | ICD-10-CM

## 2013-02-01 DIAGNOSIS — Z0000 Encounter for general adult medical examination without abnormal findings: Secondary | ICD-10-CM

## 2013-02-01 LAB — CBC WITH DIFFERENTIAL/PLATELET
Basophils Relative: 0.9 % (ref 0.0–3.0)
Eosinophils Relative: 1.8 % (ref 0.0–5.0)
HCT: 48.6 % (ref 39.0–52.0)
Hemoglobin: 16.1 g/dL (ref 13.0–17.0)
Lymphs Abs: 1.9 10*3/uL (ref 0.7–4.0)
Monocytes Relative: 12 % (ref 3.0–12.0)
Neutro Abs: 4.7 10*3/uL (ref 1.4–7.7)
Platelets: 248 10*3/uL (ref 150.0–400.0)
RBC: 5.26 Mil/uL (ref 4.22–5.81)
WBC: 7.9 10*3/uL (ref 4.5–10.5)

## 2013-02-01 LAB — HEPATIC FUNCTION PANEL
AST: 22 U/L (ref 0–37)
Albumin: 4.3 g/dL (ref 3.5–5.2)
Alkaline Phosphatase: 84 U/L (ref 39–117)

## 2013-02-01 LAB — TSH: TSH: 2.22 u[IU]/mL (ref 0.35–5.50)

## 2013-02-01 LAB — BASIC METABOLIC PANEL
GFR: 93.98 mL/min (ref >60.00)
Potassium: 4.8 mEq/L (ref 3.5–5.1)
Sodium: 137 mEq/L (ref 135–145)

## 2013-02-01 LAB — LIPID PANEL
HDL: 45.2 mg/dL (ref >39.00)
Total CHOL/HDL Ratio: 5
VLDL: 19.2 mg/dL (ref 0.0–40.0)

## 2013-02-01 MED ORDER — METOPROLOL TARTRATE 100 MG PO TABS: 100.0000 mg | ORAL_TABLET | Freq: Two times a day (BID) | ORAL | Status: DC

## 2013-02-01 NOTE — Assessment & Plan Note (Signed)
Still in atrial fibrillation Good rate control Otherwise healthy so only real risk is age Discussed using coumadin again or other anticoagulant--- he prefers just the aspirin and this is probably reasonable Discussed cardiology consultation--he defers

## 2013-02-01 NOTE — Progress Notes (Signed)
Pre-visit discussion using our clinic review tool. No additional management support is needed unless otherwise documented below in the visit note.  

## 2013-02-01 NOTE — Progress Notes (Signed)
Subjective:    Patient ID: Martin Conner, male    DOB: 1927-05-10, 79 y.o.   MRN: [id number]  HPI Here for physical  Still recovering from tibia/fibula fracture Having special treatment to stimulate growth (?ultrasound) Follows with Dr Dion Saucier Partial weight bearing with wheeled walker  Atrial fib noted while in hospital  Started on coumadin while in rehab---was not kept on long term  ?CHADS=2  No other concerns Wonders about the circulation in 4th and 5th toes on right--has hard walking shoe  Current Outpatient Prescriptions on File Prior to Visit  Medication Sig Dispense Refill  . metoprolol (LOPRESSOR) 100 MG tablet Take 100 mg by mouth 2 (two) times daily.      . Multiple Vitamins-Iron (MULTIVITAMINS WITH IRON) TABS Take 1 tablet by mouth daily.       No current facility-administered medications on file prior to visit.    No Known Allergies  Past Medical History  Diagnosis Date  . Hyperlipidemia   . Hypertension   . Lumbar disc disease   . Osteoarthritis   . Atrial fibrillation     Past Surgical History  Procedure Laterality Date  . Total hip arthroplasty      left  . Ankle surgery      right ankle  . Tibia im nail insertion Right 07/21/2012    Procedure: INTRAMEDULLARY (IM) NAIL TIBIAL;  Surgeon: Eulas Post, MD;  Location: [facility] OR;  Service: Orthopedics;  Laterality: Right;    Family History  Problem Relation Age of Onset  . Cancer Mother   . Hyperlipidemia Sister   . Hypertension Sister     History   Social History  . Marital Status: Married    Spouse Name: N/A    Number of Children: 2  . Years of Education: N/A   Occupational History  . retired  At [employer]   Social History Main Topics  . Smoking status: Never Smoker   . Smokeless tobacco: Never Used  . Alcohol Use: Yes     Comment: 3 or 4 alcoholic beverages per week  . Drug Use: No  . Sexual Activity: Not on file   Other Topics Concern  . Not on file   Social History Narrative   No  living will   Requests that wife make decisions for him if needed   Would accept resuscitation but no prolonged artificial ventilation   Would not want tube feeds if cognitively unaware   Review of Systems  Constitutional: Negative for fatigue and unexpected weight change.       Wears seat belt  HENT: Positive for hearing loss and rhinorrhea. Negative for congestion, dental problem and tinnitus.        Overdue for dentist  Eyes: Negative for visual disturbance.       No diplopia or unilateral vision loss  Respiratory: Negative for cough, chest tightness and shortness of breath.   Cardiovascular: Negative for chest pain, palpitations and leg swelling.  Gastrointestinal: Negative for nausea, vomiting, abdominal pain, constipation and blood in stool.       No heartburn  Endocrine: Negative for cold intolerance and heat intolerance.  Genitourinary: Positive for difficulty urinating. Negative for urgency and frequency.       Slight dribbling and slow stream  Musculoskeletal: Positive for arthralgias and back pain. Negative for joint swelling.       Right hip pain at times---had THR on left in past  Skin: Negative for rash.       No  suspicious lesions  Allergic/Immunologic: Negative for environmental allergies and immunocompromised state.  Neurological: Negative for dizziness, syncope, weakness, light-headedness, numbness and headaches.  Hematological: Negative for adenopathy. Bruises/bleeds easily.  Psychiatric/Behavioral: Negative for sleep disturbance and dysphoric mood. The patient is not nervous/anxious.        Drinks alcohol rarely now--- 2 beers a week now Had been 2-3 per day in past       Objective:   Physical Exam  Constitutional: He appears well-developed and well-nourished. No distress.  HENT:  Head: Normocephalic and atraumatic.  Right Ear: External ear normal.  Left Ear: External ear normal.  Mouth/Throat: Oropharynx is clear and moist. No oropharyngeal exudate.  Eyes:  Conjunctivae and EOM are normal. Pupils are equal, round, and reactive to light.  Neck: Normal range of motion. Neck supple. No thyromegaly present.  Cardiovascular: Normal rate.  Exam reveals no gallop.   Murmur heard. Slightly irregular--- seems regular at times but with skips Good PT pulse on right, faint on left Soft systolic murmur towards the apex  Pulmonary/Chest: Effort normal and breath sounds normal. No respiratory distress. He has no wheezes. He has no rales.  Abdominal: Soft. There is no tenderness.  Musculoskeletal: He exhibits no edema and no tenderness.  Feet cool but no ulcers  Lymphadenopathy:    He has no cervical adenopathy.  Skin: No rash noted. No erythema.  Multiple seborrheic keratoses  Psychiatric: He has a normal mood and affect. His behavior is normal.          Assessment & Plan:

## 2013-02-01 NOTE — Assessment & Plan Note (Signed)
Discussed primary prevention He doesn't want meds

## 2013-02-01 NOTE — Assessment & Plan Note (Signed)
Doing okay other than the tibia fracture No cancer screening due to age Td and flu shots today

## 2013-02-01 NOTE — Assessment & Plan Note (Signed)
BP Readings from Last 3 Encounters:  02/01/13 120/80  08/05/12 125/78  08/03/12 128/69   Good control On the metoprolol mostly for rate control at this point

## 2013-02-01 NOTE — Addendum Note (Signed)
Addended by: Sueanne Margarita on: 02/01/2013 12:42 PM   Modules accepted: Orders

## 2013-02-03 ENCOUNTER — Encounter: Payer: Self-pay | Admitting: *Deleted

## 2013-12-16 ENCOUNTER — Other Ambulatory Visit: Payer: Self-pay | Admitting: Internal Medicine

## 2014-02-03 ENCOUNTER — Ambulatory Visit (INDEPENDENT_AMBULATORY_CARE_PROVIDER_SITE_OTHER): Payer: Medicare Other | Admitting: Internal Medicine

## 2014-02-03 ENCOUNTER — Encounter: Payer: Self-pay | Admitting: Internal Medicine

## 2014-02-03 VITALS — BP 140/80 | HR 60 | Temp 98.2°F | Ht 70.0 in | Wt 191.0 lb

## 2014-02-03 DIAGNOSIS — Z7189 Other specified counseling: Secondary | ICD-10-CM

## 2014-02-03 DIAGNOSIS — Z23 Encounter for immunization: Secondary | ICD-10-CM

## 2014-02-03 DIAGNOSIS — E785 Hyperlipidemia, unspecified: Secondary | ICD-10-CM

## 2014-02-03 DIAGNOSIS — I10 Essential (primary) hypertension: Secondary | ICD-10-CM

## 2014-02-03 DIAGNOSIS — I480 Paroxysmal atrial fibrillation: Secondary | ICD-10-CM

## 2014-02-03 DIAGNOSIS — I080 Rheumatic disorders of both mitral and aortic valves: Secondary | ICD-10-CM

## 2014-02-03 DIAGNOSIS — Z0000 Encounter for general adult medical examination without abnormal findings: Secondary | ICD-10-CM

## 2014-02-03 LAB — CBC WITH DIFFERENTIAL/PLATELET
BASOS ABS: 0.1 10*3/uL (ref 0.0–0.1)
Basophils Relative: 1 % (ref 0.0–3.0)
EOS ABS: 0.2 10*3/uL (ref 0.0–0.7)
Eosinophils Relative: 2.6 % (ref 0.0–5.0)
HEMATOCRIT: 50.5 % (ref 39.0–52.0)
Hemoglobin: 16.4 g/dL (ref 13.0–17.0)
LYMPHS ABS: 1.5 10*3/uL (ref 0.7–4.0)
Lymphocytes Relative: 19.8 % (ref 12.0–46.0)
MCHC: 32.5 g/dL (ref 30.0–36.0)
MCV: 96.8 fl (ref 78.0–100.0)
MONO ABS: 0.8 10*3/uL (ref 0.1–1.0)
Monocytes Relative: 10.6 % (ref 3.0–12.0)
NEUTROS PCT: 66 % (ref 43.0–77.0)
Neutro Abs: 4.9 10*3/uL (ref 1.4–7.7)
PLATELETS: 230 10*3/uL (ref 150.0–400.0)
RBC: 5.22 Mil/uL (ref 4.22–5.81)
RDW: 15.3 % (ref 11.5–15.5)
WBC: 7.4 10*3/uL (ref 4.0–10.5)

## 2014-02-03 LAB — COMPREHENSIVE METABOLIC PANEL
ALBUMIN: 3.6 g/dL (ref 3.5–5.2)
ALK PHOS: 80 U/L (ref 39–117)
ALT: 17 U/L (ref 0–53)
AST: 23 U/L (ref 0–37)
BILIRUBIN TOTAL: 1.1 mg/dL (ref 0.2–1.2)
BUN: 15 mg/dL (ref 6–23)
CO2: 24 meq/L (ref 19–32)
Calcium: 9.7 mg/dL (ref 8.4–10.5)
Chloride: 102 mEq/L (ref 96–112)
Creatinine, Ser: 1.1 mg/dL (ref 0.4–1.5)
GFR: 67.03 mL/min (ref >60.00)
GLUCOSE: 118 mg/dL — AB (ref 70–99)
POTASSIUM: 4.7 meq/L (ref 3.5–5.1)
SODIUM: 137 meq/L (ref 135–145)
TOTAL PROTEIN: 8.2 g/dL (ref 6.0–8.3)

## 2014-02-03 LAB — LIPID PANEL
CHOLESTEROL: 208 mg/dL — AB (ref 0–200)
HDL: 31.3 mg/dL — ABNORMAL LOW (ref >39.00)
LDL Cholesterol: 154 mg/dL — ABNORMAL HIGH (ref 0–99)
NONHDL: 176.7
Total CHOL/HDL Ratio: 7
Triglycerides: 115 mg/dL (ref 0.0–149.0)
VLDL: 23 mg/dL (ref 0.0–40.0)

## 2014-02-03 LAB — T4, FREE: FREE T4: 0.86 ng/dL (ref 0.60–1.60)

## 2014-02-03 NOTE — Assessment & Plan Note (Signed)
Blank forms given See social history

## 2014-02-03 NOTE — Assessment & Plan Note (Signed)
Paroxysmal No evidence of ongoing issues so just aspirin

## 2014-02-03 NOTE — Assessment & Plan Note (Signed)
Discussed primary prevention Will not use statin

## 2014-02-03 NOTE — Addendum Note (Signed)
Addended by: Despina Hidden on: 02/03/2014 11:15 AM   Modules accepted: Orders

## 2014-02-03 NOTE — Assessment & Plan Note (Signed)
BP Readings from Last 3 Encounters:  02/03/14 140/80  02/01/13 120/80  08/05/12 125/78   \good control

## 2014-02-03 NOTE — Progress Notes (Signed)
Pre visit review using our clinic review tool, if applicable. No additional management support is needed unless otherwise documented below in the visit note. 

## 2014-02-03 NOTE — Assessment & Plan Note (Signed)
Murmur stable and no clear symptoms DOE probably deconditioning--asked him to increase his walking

## 2014-02-03 NOTE — Assessment & Plan Note (Signed)
I have personally reviewed the Medicare Annual Wellness questionnaire and have noted 1. The patient's medical and social history 2. Their use of alcohol, tobacco or illicit drugs 3. Their current medications and supplements 4. The patient's functional ability including ADL's, fall risks, home safety risks and hearing or visual             impairment. 5. Diet and physical activities 6. Evidence for depression or mood disorders  The patients weight, height, BMI and visual acuity have been recorded in the chart I have made referrals, counseling and provided education to the patient based review of the above and I have provided the pt with a written personalized care plan for preventive services.  I have provided you with a copy of your personalized plan for preventive services. Please take the time to review along with your updated medication list.  No cancer screening due to age 80 today No zostavax after discussion Discussed increasing exercise

## 2014-02-03 NOTE — Progress Notes (Signed)
Subjective:    Patient ID: Martin Conner, male    DOB: [birth year], 80 y.o.   MRN: [id number]  HPI Here for Medicare wellness and physical Reviewed form and advanced directives Doesn't see other doctors--other than his dentist and [facility] for his [facility] No recent falls No depression or anhedonia Drinks 3 beers/vodka tonic most days---out socializing with friends. No tobacco. No exercise Independent with ADLs Mild hearing issues. Vision is okay No apparent cognitive problems  Doesn't exercise Gets out of breath bending over Can walk briefly No palpitations No SOB at rest  Current Outpatient Prescriptions on File Prior to Visit  Medication Sig Dispense Refill  . aspirin 81 MG tablet Take 81 mg by mouth daily.    . metoprolol (LOPRESSOR) 100 MG tablet TAKE 1 TABLET TWICE A DAY 180 tablet 2   No current facility-administered medications on file prior to visit.    No Known Allergies  Past Medical History  Diagnosis Date  . Hyperlipidemia   . Hypertension   . Lumbar disc disease   . Osteoarthritis   . Atrial fibrillation     Past Surgical History  Procedure Laterality Date  . Total hip arthroplasty      left  . Ankle surgery      right ankle  . Tibia im nail insertion Right 07/21/2012    Procedure: INTRAMEDULLARY (IM) NAIL TIBIAL;  Surgeon: Johnny Bridge, MD;  Location: [place];  Service: Orthopedics;  Laterality: Right;    Family History  Problem Relation Age of Onset  . Cancer Mother   . Hyperlipidemia Sister   . Hypertension Sister     History   Social History  . Marital Status: Married    Spouse Name: N/A    Number of Children: 2  . Years of Education: N/A   Occupational History  . retired  [employer]   Social History Main Topics  . Smoking status: Never Smoker   . Smokeless tobacco: Never Used  . Alcohol Use: Yes     Comment: 3 or 4 alcoholic beverages per week  . Drug Use: No  . Sexual Activity: Not on file   Other Topics Concern  . Not  on file   Social History Narrative   No living will   Requests that wife make decisions for him if needed   Would accept resuscitation but no prolonged artificial ventilation   Would not want tube feeds if cognitively unaware   Review of Systems  Constitutional: Negative for fatigue.       Has gained his weight back from the broken leg Wears seat belt  HENT: Positive for hearing loss. Negative for dental problem and tinnitus.        Keeps up with dentist  Eyes: Negative for visual disturbance.  Respiratory: Positive for shortness of breath. Negative for cough and chest tightness.        Stable DOE  Cardiovascular: Negative for chest pain, palpitations and leg swelling.  Gastrointestinal: Negative for nausea, vomiting, abdominal pain, constipation and blood in stool.       No heartburn  Endocrine: Negative for polydipsia and polyuria.  Genitourinary: Positive for difficulty urinating. Negative for urgency and frequency.       Mild slow stream Nocturia x 1  Musculoskeletal: Negative for back pain, joint swelling and arthralgias.  Skin:       No suspicious lesions Recent "jock itch"---OTC cream cleared it up  Allergic/Immunologic: Negative for environmental allergies and immunocompromised state.  Neurological: Negative for dizziness, syncope, weakness, light-headedness, numbness and headaches.  Hematological: Negative for adenopathy. Bruises/bleeds easily.  Psychiatric/Behavioral: Negative for sleep disturbance and dysphoric mood.       Objective:   Physical Exam  Constitutional: He is oriented to person, place, and time. He appears well-developed and well-nourished. No distress.  HENT:  Head: Normocephalic and atraumatic.  Right Ear: External ear normal.  Left Ear: External ear normal.  Mouth/Throat: Oropharynx is clear and moist. No oropharyngeal exudate.  Eyes: Conjunctivae and EOM are normal. Pupils are equal, round, and reactive to light.  Neck: Normal range of motion.  Neck supple. No thyromegaly present.  Cardiovascular: Normal rate and regular rhythm.  Exam reveals no gallop.   No murmur heard. Feet purplish Normal pulse on right, absent on left Gr 9-4/8 mitral systolic murmur  Pulmonary/Chest: Effort normal and breath sounds normal. No respiratory distress. He has no wheezes. He has no rales.  Abdominal: Soft. There is no tenderness.  Musculoskeletal: He exhibits no edema or tenderness.  Lymphadenopathy:    He has no cervical adenopathy.  Neurological: He is alert and oriented to person, place, and time.  President-- [address], [place], [place]" [phone] [id number] Recall 0/3  Skin: No rash noted. No erythema.  Psychiatric: He has a normal mood and affect. His behavior is normal.          Assessment & Plan:

## 2014-02-06 ENCOUNTER — Encounter: Payer: Self-pay | Admitting: *Deleted

## 2014-09-06 ENCOUNTER — Other Ambulatory Visit: Payer: Self-pay | Admitting: Internal Medicine

## 2015-01-01 ENCOUNTER — Encounter: Payer: Self-pay | Admitting: Internal Medicine

## 2015-01-01 ENCOUNTER — Telehealth: Payer: Self-pay | Admitting: Internal Medicine

## 2015-01-01 ENCOUNTER — Ambulatory Visit (INDEPENDENT_AMBULATORY_CARE_PROVIDER_SITE_OTHER): Payer: Medicare Other | Admitting: Internal Medicine

## 2015-01-01 VITALS — BP 180/98 | HR 69 | Temp 97.7°F | Wt 185.0 lb

## 2015-01-01 DIAGNOSIS — R4781 Slurred speech: Secondary | ICD-10-CM | POA: Diagnosis not present

## 2015-01-01 DIAGNOSIS — I10 Essential (primary) hypertension: Secondary | ICD-10-CM | POA: Diagnosis not present

## 2015-01-01 MED ORDER — LOSARTAN POTASSIUM-HCTZ 50-12.5 MG PO TABS: 1.0000 | ORAL_TABLET | Freq: Every day | ORAL | Status: DC

## 2015-01-01 NOTE — Progress Notes (Signed)
Subjective:    Patient ID: Martin Conner, male    DOB: 03/07/1928, 81 y.o.   MRN: [id number]  HPI Here due to elevated BP  Was out of town Friend thought he didn't look good about 5 days Checked BP and it was high Then had it checked at paramedics As high as 053 systolic  He has felt okay Came home a day early Friend thought he was slurring words some--he doesn't think so  No headaches No chest pain No arm pain No SOB No dizziness or syncope No edema  No facial droop No dysphagia No focal numbness or weakness  Home for 6 days Wife hasn't noticed any neurologic symptoms  Current Outpatient Prescriptions on File Prior to Visit  Medication Sig Dispense Refill  . aspirin 81 MG tablet Take 81 mg by mouth daily.    . metoprolol (LOPRESSOR) 100 MG tablet TAKE 1 TABLET TWICE A DAY 180 tablet 1   No current facility-administered medications on file prior to visit.    No Known Allergies  Past Medical History  Diagnosis Date  . Hyperlipidemia   . Hypertension   . Lumbar disc disease   . Osteoarthritis   . Atrial fibrillation [facility])     Past Surgical History  Procedure Laterality Date  . Total hip arthroplasty      left  . Ankle surgery      right ankle  . Tibia im nail insertion Right 07/21/2012    Procedure: INTRAMEDULLARY (IM) NAIL TIBIAL;  Surgeon: Johnny Bridge, MD;  Location: [place];  Service: Orthopedics;  Laterality: Right;    Family History  Problem Relation Age of Onset  . Cancer Mother   . Hyperlipidemia Sister   . Hypertension Sister     Social History   Social History  . Marital Status: Married    Spouse Name: N/A  . Number of Children: 2  . Years of Education: N/A   Occupational History  . retired  [employer]   Social History Main Topics  . Smoking status: Never Smoker   . Smokeless tobacco: Never Used  . Alcohol Use: Yes     Comment: 3 or 4 alcoholic beverages per week  . Drug Use: No  . Sexual Activity: Not on file   Other Topics  Concern  . Not on file   Social History Narrative   No living will   Requests that wife make decisions for him if needed   Would accept resuscitation but no prolonged artificial ventilation   Would not want tube feeds if cognitively unaware   Review of Systems  Appetite is fine Weight is stable Sleeping okay---occasionally takes advil PM (but not regularly)     Objective:   Physical Exam  Constitutional: He is oriented to person, place, and time. He appears well-developed and well-nourished. No distress.  Neck: Normal range of motion. Neck supple. No thyromegaly present.  No carotid bruit  Cardiovascular: Normal rate.  Exam reveals no gallop.   No murmur heard. irregular  Pulmonary/Chest: Effort normal and breath sounds normal. No respiratory distress. He has no wheezes. He has no rales.  Abdominal: Soft. There is no tenderness.  No renal bruit  Lymphadenopathy:    He has no cervical adenopathy.  Neurological: He is alert and oriented to person, place, and time. He has normal strength. No cranial nerve deficit. He exhibits normal muscle tone. He displays a negative Romberg sign. Coordination and gait normal.  Psychiatric: He has a  normal mood and affect. His behavior is normal.          Assessment & Plan:

## 2015-01-01 NOTE — Progress Notes (Signed)
Pre visit review using our clinic review tool, if applicable. No additional management support is needed unless otherwise documented below in the visit note. 

## 2015-01-01 NOTE — Telephone Encounter (Signed)
[facility]  Patient Name: Martin Conner  DOB: Jul 25, 1927    Initial Comment caller states husband has slurred speech, not walking steady, and BP is 198/118   Nurse Assessment  Nurse: Wynetta Emery, RN, Baker Janus Date/Time (Eastern Time): 01/01/2015 8:40:39 AM  Confirm and document reason for call. If symptomatic, describe symptoms. ---Martin Conner was on vacation when had slurred speech hard time walking and elevated blood pressure seen by paramedic and advised to see MD -- onset 5th today continues to have elevated blood pressure only no other s/sx of CVA  Has the patient traveled out of the country within the last 30 days? ---No  Does the patient have any new or worsening symptoms? ---Yes  Will a triage be completed? ---Yes  Related visit to physician within the last 2 weeks? ---No  Does the PT have any chronic conditions? (i.e. diabetes, asthma, etc.) ---No     Guidelines    Guideline Title Affirmed Question Affirmed Notes  High Blood Pressure BP ? 180/110    Final Disposition User   See Physician within 24 Hours Wynetta Emery, RN, Baker Janus    Comments  APPT SCHEDULED 01-01-2015 4PM TODAY WITH DR. Silvio Pate -- ADVISED TO TAKE B/P MEDICATIONS AND ANY S/SX OF INFORMATION WE WENT OVER CALL 911 AND SEND TO ED BEFORE APPT TIME   Referrals  REFERRED TO PCP OFFICE

## 2015-01-01 NOTE — Assessment & Plan Note (Signed)
BP Readings from Last 3 Encounters:  01/01/15 180/98  02/03/14 140/80  02/01/13 120/80   Repeat 198/120 on right No current symptoms No clear reason for the sudden increase Will add losartan/HCTZ and see him back in a week

## 2015-01-01 NOTE — Patient Instructions (Signed)
Continue the metoprolol. Start the new medication tonight--- then continue daily in the morning starting tomorrow.

## 2015-01-01 NOTE — Assessment & Plan Note (Signed)
Reported by friend while away at beach Neuro exam fine today Wife hasn't noticed anything in 6 days back Will hold off on carotids, etc

## 2015-01-01 NOTE — Telephone Encounter (Signed)
Noted As long as no current neuro symptoms, can probably wait for the upcoming appt

## 2015-01-10 ENCOUNTER — Encounter: Payer: Self-pay | Admitting: Internal Medicine

## 2015-01-10 ENCOUNTER — Ambulatory Visit (INDEPENDENT_AMBULATORY_CARE_PROVIDER_SITE_OTHER): Payer: Medicare Other | Admitting: Internal Medicine

## 2015-01-10 VITALS — BP 128/86 | HR 61 | Temp 98.2°F | Wt 186.0 lb

## 2015-01-10 DIAGNOSIS — Z23 Encounter for immunization: Secondary | ICD-10-CM

## 2015-01-10 DIAGNOSIS — I10 Essential (primary) hypertension: Secondary | ICD-10-CM | POA: Diagnosis not present

## 2015-01-10 LAB — CBC WITH DIFFERENTIAL/PLATELET
BASOS ABS: 0.1 10*3/uL (ref 0.0–0.1)
Basophils Relative: 1.2 % (ref 0.0–3.0)
EOS PCT: 3.1 % (ref 0.0–5.0)
Eosinophils Absolute: 0.2 10*3/uL (ref 0.0–0.7)
HEMATOCRIT: 52.9 % — AB (ref 39.0–52.0)
HEMOGLOBIN: 17.3 g/dL — AB (ref 13.0–17.0)
LYMPHS PCT: 21.3 % (ref 12.0–46.0)
Lymphs Abs: 1.7 10*3/uL (ref 0.7–4.0)
MCHC: 32.6 g/dL (ref 30.0–36.0)
MCV: 97.2 fl (ref 78.0–100.0)
MONOS PCT: 13.4 % — AB (ref 3.0–12.0)
Monocytes Absolute: 1.1 10*3/uL — ABNORMAL HIGH (ref 0.1–1.0)
Neutro Abs: 4.8 10*3/uL (ref 1.4–7.7)
Neutrophils Relative %: 61 % (ref 43.0–77.0)
Platelets: 258 10*3/uL (ref 150.0–400.0)
RBC: 5.45 Mil/uL (ref 4.22–5.81)
RDW: 15.6 % — ABNORMAL HIGH (ref 11.5–15.5)
WBC: 7.9 10*3/uL (ref 4.0–10.5)

## 2015-01-10 LAB — COMPREHENSIVE METABOLIC PANEL
ALBUMIN: 4 g/dL (ref 3.5–5.2)
ALK PHOS: 75 U/L (ref 39–117)
ALT: 15 U/L (ref 0–53)
AST: 21 U/L (ref 0–37)
BILIRUBIN TOTAL: 1 mg/dL (ref 0.2–1.2)
BUN: 17 mg/dL (ref 6–23)
CALCIUM: 10 mg/dL (ref 8.4–10.5)
CO2: 30 mEq/L (ref 19–32)
Chloride: 100 mEq/L (ref 96–112)
Creatinine, Ser: 1.1 mg/dL (ref 0.40–1.50)
GFR: 67.59 mL/min (ref >60.00)
GLUCOSE: 109 mg/dL — AB (ref 70–99)
POTASSIUM: 4.1 meq/L (ref 3.5–5.1)
Sodium: 141 mEq/L (ref 135–145)
TOTAL PROTEIN: 7.8 g/dL (ref 6.0–8.3)

## 2015-01-10 LAB — LIPID PANEL
Cholesterol: 222 mg/dL — ABNORMAL HIGH (ref 0–200)
HDL: 46.5 mg/dL (ref >39.00)
LDL CALC: 140 mg/dL — AB (ref 0–99)
NONHDL: 175.17
Total CHOL/HDL Ratio: 5
Triglycerides: 174 mg/dL — ABNORMAL HIGH (ref 0.0–149.0)
VLDL: 34.8 mg/dL (ref 0.0–40.0)

## 2015-01-10 NOTE — Assessment & Plan Note (Signed)
BP Readings from Last 3 Encounters:  01/10/15 128/86  01/01/15 180/98  02/03/14 140/80   Much better No problems on the med Will continue and check labs

## 2015-01-10 NOTE — Addendum Note (Signed)
Addended by: Despina Hidden on: 01/10/2015 09:33 AM   Modules accepted: Orders

## 2015-01-10 NOTE — Progress Notes (Signed)
   Subjective:    Patient ID: Martin Conner, male    DOB: [birth year], 81 y.o.   MRN: [id number]  HPI Here for follow up of HTN  No problems with med No chest pain No SOB Stays fairly active--no change in exercise tolerance for chores No dizziness or syncope  No speech problems, or other signs of TIA He downplays this and wife had not been concerned  Current Outpatient Prescriptions on File Prior to Visit  Medication Sig Dispense Refill  . aspirin 81 MG tablet Take 81 mg by mouth daily.    Marland Kitchen losartan-hydrochlorothiazide (HYZAAR) 50-12.5 MG tablet Take 1 tablet by mouth daily. 30 tablet 3  . metoprolol (LOPRESSOR) 100 MG tablet TAKE 1 TABLET TWICE A DAY 180 tablet 1   No current facility-administered medications on file prior to visit.    No Known Allergies  Past Medical History  Diagnosis Date  . Hyperlipidemia   . Hypertension   . Lumbar disc disease   . Osteoarthritis   . Atrial fibrillation [facility])     Past Surgical History  Procedure Laterality Date  . Total hip arthroplasty      left  . Ankle surgery      right ankle  . Tibia im nail insertion Right 07/21/2012    Procedure: INTRAMEDULLARY (IM) NAIL TIBIAL;  Surgeon: Johnny Bridge, MD;  Location: [place];  Service: Orthopedics;  Laterality: Right;    Family History  Problem Relation Age of Onset  . Cancer Mother   . Hyperlipidemia Sister   . Hypertension Sister     Social History   Social History  . Marital Status: Married    Spouse Name: N/A  . Number of Children: 2  . Years of Education: N/A   Occupational History  . retired  At [employer]   Social History Main Topics  . Smoking status: Never Smoker   . Smokeless tobacco: Never Used  . Alcohol Use: Yes     Comment: 3 or 4 alcoholic beverages per week  . Drug Use: No  . Sexual Activity: Not on file   Other Topics Concern  . Not on file   Social History Narrative   No living will   Requests that wife make decisions for him if needed   Would accept  resuscitation but no prolonged artificial ventilation   Would not want tube feeds if cognitively unaware   Review of Systems  Appetite is okay Sleeping well     Objective:   Physical Exam  Constitutional: He appears well-developed and well-nourished. No distress.  Neck: Normal range of motion. Neck supple. No thyromegaly present.  Cardiovascular: Normal rate and normal heart sounds.  Exam reveals no gallop.   No murmur heard. Slightly irregular  Pulmonary/Chest: Effort normal and breath sounds normal. No respiratory distress. He has no wheezes. He has no rales.  Musculoskeletal: He exhibits no edema.  Lymphadenopathy:    He has no cervical adenopathy.          Assessment & Plan:

## 2015-01-10 NOTE — Progress Notes (Signed)
Pre visit review using our clinic review tool, if applicable. No additional management support is needed unless otherwise documented below in the visit note. 

## 2015-01-12 ENCOUNTER — Encounter: Payer: Self-pay | Admitting: *Deleted

## 2015-02-09 ENCOUNTER — Encounter: Payer: Medicare Other | Admitting: Internal Medicine

## 2015-03-04 ENCOUNTER — Other Ambulatory Visit: Payer: Self-pay | Admitting: Internal Medicine

## 2015-03-08 ENCOUNTER — Ambulatory Visit (INDEPENDENT_AMBULATORY_CARE_PROVIDER_SITE_OTHER): Payer: Medicare Other | Admitting: Internal Medicine

## 2015-03-08 ENCOUNTER — Encounter: Payer: Self-pay | Admitting: Internal Medicine

## 2015-03-08 VITALS — BP 148/80 | HR 62 | Temp 98.1°F | Ht 70.0 in | Wt 190.0 lb

## 2015-03-08 DIAGNOSIS — I10 Essential (primary) hypertension: Secondary | ICD-10-CM | POA: Diagnosis not present

## 2015-03-08 DIAGNOSIS — M159 Polyosteoarthritis, unspecified: Secondary | ICD-10-CM

## 2015-03-08 DIAGNOSIS — Z0000 Encounter for general adult medical examination without abnormal findings: Secondary | ICD-10-CM

## 2015-03-08 DIAGNOSIS — Z7189 Other specified counseling: Secondary | ICD-10-CM | POA: Diagnosis not present

## 2015-03-08 DIAGNOSIS — I482 Chronic atrial fibrillation, unspecified: Secondary | ICD-10-CM

## 2015-03-08 MED ORDER — LOSARTAN POTASSIUM-HCTZ 100-25 MG PO TABS: 1.0000 | ORAL_TABLET | Freq: Every day | ORAL | Status: DC

## 2015-03-08 NOTE — Assessment & Plan Note (Signed)
Mild Back better No meds Advised no NSAIDs

## 2015-03-08 NOTE — Patient Instructions (Signed)
Double up on your losartan/HCTZ--take 2 of the 50/12.5 tabs till the new 100/25 tabs come from [facility]

## 2015-03-08 NOTE — Progress Notes (Signed)
Pre visit review using our clinic review tool, if applicable. No additional management support is needed unless otherwise documented below in the visit note. 

## 2015-03-08 NOTE — Assessment & Plan Note (Signed)
BP Readings from Last 3 Encounters:  03/08/15 148/80  01/10/15 128/86  01/01/15 180/98   Repeat 162/102 on right Will increase the med (double) Recheck 2 months

## 2015-03-08 NOTE — Assessment & Plan Note (Signed)
I have personally reviewed the Medicare Annual Wellness questionnaire and have noted 1. The patient's medical and social history 2. Their use of alcohol, tobacco or illicit drugs 3. Their current medications and supplements 4. The patient's functional ability including ADL's, fall risks, home safety risks and hearing or visual             impairment. 5. Diet and physical activities 6. Evidence for depression or mood disorders  The patients weight, height, BMI and visual acuity have been recorded in the chart I have made referrals, counseling and provided education to the patient based review of the above and I have provided the pt with a written personalized care plan for preventive services.  I have provided you with a copy of your personalized plan for preventive services. Please take the time to review along with your updated medication list.  No cancer screening due to age Prefers no zosatavax Recommended more regular exercise

## 2015-03-08 NOTE — Assessment & Plan Note (Signed)
See social history Will consider formal directives

## 2015-03-08 NOTE — Assessment & Plan Note (Signed)
i recommended anticoagulation with NOAC or coumadin---he prefers to stick with ASA Cautioned him to let me know if he has any, even brief, neuro symptoms

## 2015-03-08 NOTE — Progress Notes (Signed)
Subjective:    Patient ID: Martin Conner, male    DOB: 01/25/1928, 81 y.o.   MRN: [id number]  HPI Here for Medicare wellness and follow up of chronic medical conditions Reviewed form and advanced directives Reviewed other doctors--none except eye doctor Imagene Riches?) and dentist No tobacco 2-3 drinks at most-- twice a week usually Not really exercising Vision is okay Hearing is okay No falls No depression or anhedonia Independent with instrumental ADLs Thinks his memory is pretty good  BP has been okay Usually fine at home--he doesn't remember numbers No speech changes No signs of stroke No chest pain No SOB No dizziness or syncope No exercise--tries to walk a little No palpitations  Mild trouble with voiding Slow stream Nocturia x 1 usually Not a big deal  No sig arthritis pain Occasional wrist or thumb pain No back problems lately  Current Outpatient Prescriptions on File Prior to Visit  Medication Sig Dispense Refill  . aspirin 81 MG tablet Take 81 mg by mouth daily.    Marland Kitchen losartan-hydrochlorothiazide (HYZAAR) 50-12.5 MG tablet Take 1 tablet by mouth daily. 30 tablet 3  . metoprolol (LOPRESSOR) 100 MG tablet TAKE 1 TABLET TWICE A DAY 180 tablet 0   No current facility-administered medications on file prior to visit.    No Known Allergies  Past Medical History  Diagnosis Date  . Hyperlipidemia   . Hypertension   . Lumbar disc disease   . Osteoarthritis   . Atrial fibrillation [facility])     Past Surgical History  Procedure Laterality Date  . Total hip arthroplasty      left  . Ankle surgery      right ankle  . Tibia im nail insertion Right 07/21/2012    Procedure: INTRAMEDULLARY (IM) NAIL TIBIAL;  Surgeon: Johnny Bridge, MD;  Location: [place];  Service: Orthopedics;  Laterality: Right;    Family History  Problem Relation Age of Onset  . Cancer Mother   . Hyperlipidemia Sister   . Hypertension Sister     Social History   Social History  . Marital  Status: Married    Spouse Name: N/A  . Number of Children: 2  . Years of Education: N/A   Occupational History  . retired  At [employer]   Social History Main Topics  . Smoking status: Never Smoker   . Smokeless tobacco: Never Used  . Alcohol Use: Yes     Comment: 3 or 4 alcoholic beverages per week  . Drug Use: No  . Sexual Activity: Not on file   Other Topics Concern  . Not on file   Social History Narrative   No living will   Requests that wife make decisions for him if needed   Would accept resuscitation but no prolonged artificial ventilation   Would not want tube feeds if cognitively unaware   Review of Systems Sleeps fairly well Appetite is good Weight up a few pounds Teeth okay---yearly with dentist Wears seat belt Bowels are okay No skin problems    Objective:   Physical Exam  Constitutional: He is oriented to person, place, and time. He appears well-developed and well-nourished. No distress.  HENT:  Mouth/Throat: Oropharynx is clear and moist. No oropharyngeal exudate.  Neck: Normal range of motion. Neck supple. No thyromegaly present.  Cardiovascular: Normal rate, normal heart sounds and intact distal pulses.  Exam reveals no gallop.   No murmur heard. Irregular No clear cut murmur today  Pulmonary/Chest: Effort normal and breath  sounds normal. No respiratory distress. He has no wheezes. He has no rales.  Abdominal: Soft. There is no tenderness.  Musculoskeletal: He exhibits no edema or tenderness.  Lymphadenopathy:    He has no cervical adenopathy.  Neurological: He is alert and oriented to person, place, and time.  President-- "Elyn Peers, Clyda Greener [place]" [phone] [id number] Recall 2/3  Skin:  Feet purplish but palpable pulses. No ulcers  Psychiatric: He has a normal mood and affect.          Assessment & Plan:

## 2015-05-09 ENCOUNTER — Encounter: Payer: Self-pay | Admitting: Internal Medicine

## 2015-05-09 ENCOUNTER — Ambulatory Visit (INDEPENDENT_AMBULATORY_CARE_PROVIDER_SITE_OTHER): Payer: Medicare Other | Admitting: Internal Medicine

## 2015-05-09 VITALS — BP 120/70 | HR 84 | Temp 97.5°F | Wt 184.0 lb

## 2015-05-09 DIAGNOSIS — I480 Paroxysmal atrial fibrillation: Secondary | ICD-10-CM | POA: Diagnosis not present

## 2015-05-09 DIAGNOSIS — I10 Essential (primary) hypertension: Secondary | ICD-10-CM | POA: Diagnosis not present

## 2015-05-09 LAB — RENAL FUNCTION PANEL
Albumin: 4.4 g/dL (ref 3.5–5.2)
BUN: 34 mg/dL — AB (ref 6–23)
CO2: 26 mEq/L (ref 19–32)
CREATININE: 1.58 mg/dL — AB (ref 0.40–1.50)
Calcium: 10.3 mg/dL (ref 8.4–10.5)
Chloride: 100 mEq/L (ref 96–112)
GFR: 44.47 mL/min — AB (ref >60.00)
GLUCOSE: 94 mg/dL (ref 70–99)
PHOSPHORUS: 3.5 mg/dL (ref 2.3–4.6)
Potassium: 4.8 mEq/L (ref 3.5–5.1)
SODIUM: 136 meq/L (ref 135–145)

## 2015-05-09 NOTE — Progress Notes (Signed)
Pre visit review using our clinic review tool, if applicable. No additional management support is needed unless otherwise documented below in the visit note. 

## 2015-05-09 NOTE — Assessment & Plan Note (Signed)
BP Readings from Last 3 Encounters:  05/09/15 120/70  03/08/15 148/80  01/10/15 128/86   Better now No problems on higher dose Will check renal

## 2015-05-09 NOTE — Progress Notes (Signed)
   Subjective:    Patient ID: Martin Conner, male    DOB: 07-11-1927, 82 y.o.   MRN: [id number]  HPI Here for follow up of elevated BP  Taking the higher BP dose for 2 months Checks his BP at times Elevated occasionally--- but mostly systolic [id number]  No dizziness No headaches No chest pain No SOB No edema  Current Outpatient Prescriptions on File Prior to Visit  Medication Sig Dispense Refill  . aspirin 81 MG tablet Take 81 mg by mouth daily.    Marland Kitchen losartan-hydrochlorothiazide (HYZAAR) 100-25 MG tablet Take 1 tablet by mouth daily. 90 tablet 3  . metoprolol (LOPRESSOR) 100 MG tablet TAKE 1 TABLET TWICE A DAY 180 tablet 0   No current facility-administered medications on file prior to visit.    No Known Allergies  Past Medical History  Diagnosis Date  . Hyperlipidemia   . Hypertension   . Lumbar disc disease   . Osteoarthritis   . Atrial fibrillation [facility])     Past Surgical History  Procedure Laterality Date  . Total hip arthroplasty      left  . Ankle surgery      right ankle  . Tibia im nail insertion Right 07/21/2012    Procedure: INTRAMEDULLARY (IM) NAIL TIBIAL;  Surgeon: Johnny Bridge, MD;  Location: [place];  Service: Orthopedics;  Laterality: Right;    Family History  Problem Relation Age of Onset  . Cancer Mother   . Hyperlipidemia Sister   . Hypertension Sister     Social History   Social History  . Marital Status: Married    Spouse Name: N/A  . Number of Children: 2  . Years of Education: N/A   Occupational History  . retired  [employer]   Social History Main Topics  . Smoking status: Never Smoker   . Smokeless tobacco: Never Used  . Alcohol Use: Yes     Comment: 3 or 4 alcoholic beverages per week  . Drug Use: No  . Sexual Activity: Not on file   Other Topics Concern  . Not on file   Social History Narrative   No living will   Requests that wife make decisions for him if needed   Would accept resuscitation but no prolonged artificial  ventilation   Would not want tube feeds if cognitively unaware   Review of Systems  Sleeps well Appetite is still fine     Objective:   Physical Exam  Constitutional: He appears well-developed and well-nourished. No distress.  Neck: Normal range of motion. Neck supple. No thyromegaly present.  Cardiovascular: Normal rate, regular rhythm and normal heart sounds.  Exam reveals no gallop.   No murmur heard. Pulmonary/Chest: Effort normal and breath sounds normal. No respiratory distress. He has no wheezes. He has no rales.  Musculoskeletal: He exhibits no edema.  Lymphadenopathy:    He has no cervical adenopathy.          Assessment & Plan:

## 2015-05-09 NOTE — Assessment & Plan Note (Signed)
No symptoms He prefers only ASA still

## 2015-05-10 ENCOUNTER — Other Ambulatory Visit: Payer: Self-pay | Admitting: Internal Medicine

## 2015-05-10 DIAGNOSIS — N183 Chronic kidney disease, stage 3 unspecified: Secondary | ICD-10-CM

## 2015-05-13 ENCOUNTER — Encounter (HOSPITAL_COMMUNITY): Payer: Self-pay

## 2015-05-13 ENCOUNTER — Inpatient Hospital Stay (HOSPITAL_COMMUNITY)
Admission: EM | Admit: 2015-05-13 | Discharge: 2015-05-15 | DRG: 308 | Disposition: A | Discharge status: Home / Self Care | Payer: Medicare Other | Attending: Internal Medicine | Admitting: Internal Medicine

## 2015-05-13 ENCOUNTER — Emergency Department (HOSPITAL_COMMUNITY): Payer: Medicare Other

## 2015-05-13 DIAGNOSIS — I1310 Hypertensive heart and chronic kidney disease without heart failure, with stage 1 through stage 4 chronic kidney disease, or unspecified chronic kidney disease: Secondary | ICD-10-CM | POA: Diagnosis present

## 2015-05-13 DIAGNOSIS — I482 Chronic atrial fibrillation, unspecified: Secondary | ICD-10-CM | POA: Insufficient documentation

## 2015-05-13 DIAGNOSIS — Z7982 Long term (current) use of aspirin: Secondary | ICD-10-CM | POA: Diagnosis not present

## 2015-05-13 DIAGNOSIS — I469 Cardiac arrest, cause unspecified: Secondary | ICD-10-CM | POA: Diagnosis present

## 2015-05-13 DIAGNOSIS — E785 Hyperlipidemia, unspecified: Secondary | ICD-10-CM | POA: Diagnosis present

## 2015-05-13 DIAGNOSIS — N189 Chronic kidney disease, unspecified: Secondary | ICD-10-CM | POA: Diagnosis present

## 2015-05-13 DIAGNOSIS — I4891 Unspecified atrial fibrillation: Secondary | ICD-10-CM | POA: Diagnosis not present

## 2015-05-13 DIAGNOSIS — I209 Angina pectoris, unspecified: Secondary | ICD-10-CM

## 2015-05-13 DIAGNOSIS — R079 Chest pain, unspecified: Secondary | ICD-10-CM | POA: Diagnosis present

## 2015-05-13 DIAGNOSIS — N179 Acute kidney failure, unspecified: Secondary | ICD-10-CM | POA: Insufficient documentation

## 2015-05-13 DIAGNOSIS — I442 Atrioventricular block, complete: Principal | ICD-10-CM | POA: Diagnosis present

## 2015-05-13 DIAGNOSIS — E860 Dehydration: Secondary | ICD-10-CM | POA: Diagnosis present

## 2015-05-13 DIAGNOSIS — I119 Hypertensive heart disease without heart failure: Secondary | ICD-10-CM | POA: Insufficient documentation

## 2015-05-13 HISTORY — DX: Syncope and collapse: R55

## 2015-05-13 HISTORY — DX: Hypertensive heart disease without heart failure: I11.9

## 2015-05-13 LAB — COMPREHENSIVE METABOLIC PANEL
ALBUMIN: 3 g/dL — AB (ref 3.5–5.0)
ALK PHOS: 49 U/L (ref 38–126)
ALT: 16 U/L — AB (ref 17–63)
ANION GAP: 15 (ref 5–15)
AST: 23 U/L (ref 15–41)
BILIRUBIN TOTAL: 1 mg/dL (ref 0.3–1.2)
BUN: 40 mg/dL — ABNORMAL HIGH (ref 6–20)
CALCIUM: 8.7 mg/dL — AB (ref 8.9–10.3)
CO2: 20 mmol/L — AB (ref 22–32)
Chloride: 103 mmol/L (ref 101–111)
Creatinine, Ser: 2.18 mg/dL — ABNORMAL HIGH (ref 0.61–1.24)
GFR calc Af Amer: 30 mL/min — ABNORMAL LOW (ref >60)
GFR calc non Af Amer: 26 mL/min — ABNORMAL LOW (ref >60)
GLUCOSE: 158 mg/dL — AB (ref 65–99)
Potassium: 4.6 mmol/L (ref 3.5–5.1)
Sodium: 138 mmol/L (ref 135–145)
Total Protein: 6.2 g/dL — ABNORMAL LOW (ref 6.5–8.1)

## 2015-05-13 LAB — CBC WITH DIFFERENTIAL/PLATELET
BASOS ABS: 0.1 10*3/uL (ref 0.0–0.1)
Basophils Relative: 1 %
Eosinophils Absolute: 0.1 10*3/uL (ref 0.0–0.7)
Eosinophils Relative: 1 %
HEMATOCRIT: 41.4 % (ref 39.0–52.0)
HEMOGLOBIN: 14.6 g/dL (ref 13.0–17.0)
LYMPHS PCT: 16 %
Lymphs Abs: 1.6 10*3/uL (ref 0.7–4.0)
MCH: 32.8 pg (ref 26.0–34.0)
MCHC: 35.3 g/dL (ref 30.0–36.0)
MCV: 93 fL (ref 78.0–100.0)
MONO ABS: 1 10*3/uL (ref 0.1–1.0)
Monocytes Relative: 9 %
NEUTROS ABS: 7.8 10*3/uL — AB (ref 1.7–7.7)
NEUTROS PCT: 73 %
Platelets: 230 10*3/uL (ref 150–400)
RBC: 4.45 MIL/uL (ref 4.22–5.81)
RDW: 14.4 % (ref 11.5–15.5)
WBC: 10.6 10*3/uL — ABNORMAL HIGH (ref 4.0–10.5)

## 2015-05-13 LAB — I-STAT CHEM 8, ED
BUN: 41 mg/dL — ABNORMAL HIGH (ref 6–20)
CALCIUM ION: 1.13 mmol/L (ref 1.13–1.30)
CHLORIDE: 102 mmol/L (ref 101–111)
Creatinine, Ser: 2.2 mg/dL — ABNORMAL HIGH (ref 0.61–1.24)
Glucose, Bld: 169 mg/dL — ABNORMAL HIGH (ref 65–99)
HEMATOCRIT: 53 % — AB (ref 39.0–52.0)
Hemoglobin: 18 g/dL — ABNORMAL HIGH (ref 13.0–17.0)
POTASSIUM: 4.5 mmol/L (ref 3.5–5.1)
SODIUM: 138 mmol/L (ref 135–145)
TCO2: 23 mmol/L (ref 0–100)

## 2015-05-13 LAB — HEPARIN LEVEL (UNFRACTIONATED): HEPARIN UNFRACTIONATED: 1.01 [IU]/mL — AB (ref 0.30–0.70)

## 2015-05-13 LAB — PROTIME-INR
INR: 1.17 (ref 0.00–1.49)
Prothrombin Time: 15.1 seconds (ref 11.6–15.2)

## 2015-05-13 LAB — BRAIN NATRIURETIC PEPTIDE: B Natriuretic Peptide: 251.3 pg/mL — ABNORMAL HIGH (ref 0.0–100.0)

## 2015-05-13 LAB — MRSA PCR SCREENING: MRSA BY PCR: NEGATIVE

## 2015-05-13 LAB — TSH: TSH: 4.185 u[IU]/mL (ref 0.350–4.500)

## 2015-05-13 LAB — MAGNESIUM: Magnesium: 1.9 mg/dL (ref 1.7–2.4)

## 2015-05-13 LAB — I-STAT TROPONIN, ED: TROPONIN I, POC: 0.03 ng/mL (ref 0.00–0.08)

## 2015-05-13 LAB — TROPONIN I: Troponin I: 0.07 ng/mL — ABNORMAL HIGH (ref <0.031)

## 2015-05-13 MED ORDER — ASPIRIN 81 MG PO CHEW
81.0000 mg | CHEWABLE_TABLET | Freq: Every day | ORAL | Status: DC
  Administered 2015-05-14 – 2015-05-15 (×2): 81 mg via ORAL
  Filled 2015-05-13 (×2): qty 1

## 2015-05-13 MED ORDER — ACETAMINOPHEN 325 MG PO TABS: 650.0000 mg | ORAL_TABLET | ORAL | Status: DC | PRN

## 2015-05-13 MED ORDER — NITROGLYCERIN 0.4 MG SL SUBL: 0.4000 mg | SUBLINGUAL_TABLET | SUBLINGUAL | Status: DC | PRN

## 2015-05-13 MED ORDER — HEPARIN BOLUS VIA INFUSION
4000.0000 [IU] | Freq: Once | INTRAVENOUS | Status: AC
  Administered 2015-05-13: 4000 [IU] via INTRAVENOUS
  Filled 2015-05-13: qty 4000

## 2015-05-13 MED ORDER — HEPARIN (PORCINE) IN NACL 100-0.45 UNIT/ML-% IJ SOLN
750.0000 [IU]/h | INTRAMUSCULAR | Status: DC
  Administered 2015-05-13: 1050 [IU]/h via INTRAVENOUS
  Filled 2015-05-13: qty 250

## 2015-05-13 MED ORDER — ATROPINE SULFATE 0.1 MG/ML IJ SOLN
INTRAMUSCULAR | Status: AC
  Filled 2015-05-13: qty 10

## 2015-05-13 MED ORDER — SODIUM CHLORIDE 0.9 % IV SOLN
INTRAVENOUS | Status: DC
  Administered 2015-05-13: 19:00:00 via INTRAVENOUS

## 2015-05-13 MED ORDER — ONDANSETRON HCL 4 MG/2ML IJ SOLN: 4.0000 mg | Freq: Four times a day (QID) | INTRAMUSCULAR | Status: DC | PRN

## 2015-05-13 NOTE — H&P (Addendum)
History & Physical    Patient ID: Martin Conner MRN: [id number], DOB/AGE: June 02, 1927   Admit date: 05/13/2015   Primary Physician: Viviana Simpler, MD Primary Cardiologist: new - J. Anelia Carriveau, MD   Patient Profile    82 y/o ? with a h/o AF, HTN, HL, who presented to the ED today 2/2 n/v and subsequently developed asystole.  Past Medical History    Past Medical History  Diagnosis Date  . Hyperlipidemia   . Hypertensive heart disease   . Lumbar disc disease   . Osteoarthritis   . Chronic atrial fibrillation ([place])     a. 07/2012 Echo: EF 55-60%, no rwma, mildly dil RA/LA.  Marland Kitchen Syncope     a. 07/2012 - unclear etiology-->resulted in right tib/fib fx req ORIF/intramedullary nail of tibia.    Past Surgical History  Procedure Laterality Date  . Total hip arthroplasty      left  . Ankle surgery      right ankle  . Tibia im nail insertion Right 07/21/2012    Procedure: INTRAMEDULLARY (IM) NAIL TIBIAL;  Surgeon: Johnny Bridge, MD;  Location: [place];  Service: Orthopedics;  Laterality: Right;     Allergies  No Known Allergies  History of Present Illness    82 year old male with the above past medical history. He was diagnosed with atrial fibrillation several years ago and has been on beta blocker and aspirin therapy since. He also has a history of syncope dating back to May 2014. The etiology of this was unclear but was felt to be most likely secondary to dehydration and orthostasis. Regardless, it resulted in a fall and right tib/fib fracture requiring ORIF. He says that at that time, there was discussion about oral anticoagulation however he was not interested.  He has done reasonably well over the years. He is not particularly active but notes that he can walk short distances without experiencing chest pain or dyspnea. He lives locally with his wife. In the fall, he was placed on losartan-HCTZ secondary to hypertension. Creatinine in October was within normal limits. Follow-up creatinine  that were 15th was elevated at 1.58.   He was in his usual state of health until this morning, when he noted a vague chest and abdominal discomfort. He thought it was most likely secondary to constipation and drink some prune juice. He and his wife went on to church. His wife noted that while at church, he appeared to be dyspneic while singing, though he denies it. He says he simply didn't feel like singing and stopped. Following church, he was walking to his car and began to feel very lightheaded and nauseated. At the time he reached his car, he sat down and slumped over (per his wife).  He vomited at that time.  Bystanders called EMS. The fire department arrived within a few minutes and EMS arrived about 20 minutes later. His blood pressure was reportedly low. He was given 324 mg of aspirin in the field. He was transferred to [facility] for further evaluation. Here, ECG shows atrial fibrillation with anterolateral ST depression and T-wave inversion. This is similar though more pronounced than what was noted on prior ECGs. He has not been complaining of chest pain here. Initial troponin is normal. Creatinine is elevated at 2.20. While in the emergency department, he was noted by staff to become unresponsive. At the same time, he was noted to have complete heart block on the monitor. He was not immediately arousable. After 32 seconds, he  had ROSC.  He did not require CPR or ACLS.  He has been placed on the zoll with pads on.  He has no recollection of losing consciousness.  He currently denies c/p, palps, presyncope, or dyspnea.  Home Medications    Prior to Admission medications   Medication Sig Start Date End Date Taking? Authorizing Provider  aspirin 81 MG tablet Take 81 mg by mouth daily.   Yes Historical Provider, MD  losartan-hydrochlorothiazide (HYZAAR) 100-25 MG tablet Take 1 tablet by mouth daily. 03/08/15  Yes Venia Carbon, MD  metoprolol (LOPRESSOR) 100 MG tablet TAKE 1 TABLET TWICE A DAY  03/05/15  Yes Venia Carbon, MD    Family History    Family History  Problem Relation Age of Onset  . Cancer Mother   . Hyperlipidemia Sister   . Hypertension Sister     Social History    Social History   Social History  . Marital Status: Married    Spouse Name: N/A  . Number of Children: 2  . Years of Education: N/A   Occupational History  . retired  At [employer]   Social History Main Topics  . Smoking status: Never Smoker   . Smokeless tobacco: Never Used  . Alcohol Use: 2.4 oz/week    4 Cans of beer per week     Comment: 2 beers several x/wk and occas cocktail on top of it.  . Drug Use: No  . Sexual Activity: Not on file   Other Topics Concern  . Not on file   Social History Narrative   No living will   Requests that wife make decisions for him if needed   Would accept resuscitation but no prolonged artificial ventilation   Would not want tube feeds if cognitively unaware     Review of Systems    General:  No chills, fever, night sweats or weight changes.  Cardiovascular:  +++ vague chest and abd discomfort this AM.  +++ presyncope/syncope.  Nodyspnea on exertion, edema, orthopnea, palpitations, paroxysmal nocturnal dyspnea. Dermatological: No rash, lesions/masses Respiratory: No cough, dyspnea Urologic: No hematuria, dysuria Abdominal:   +++ nausea, vomiting earlier this AM.  No diarrhea, bright red blood per rectum, melena, or hematemesis Neurologic:  No visual changes, wkns, changes in mental status. All other systems reviewed and are otherwise negative except as noted above.  Physical Exam    Blood pressure 123/77, temperature 97.7 F (36.5 C), temperature source Oral, resp. rate 24, height 5' 10.5" (1.791 m), weight 184 lb (83.462 kg), SpO2 94 %.  General: Pleasant, NAD Psych: Normal affect. Neuro: Alert and oriented X 3. Moves all extremities spontaneously. HEENT: Normal  Neck: Supple without bruits or JVD. Lungs:  Resp regular and unlabored,  CTA. Heart: IR, IR no s3, s4, or murmurs. Abdomen: Soft, non-tender, non-distended, BS + x 4.  Extremities: No clubbing, cyanosis or edema. DP/PT/Radials 2+ and equal bilaterally.  Labs    Troponin [facility] Test)  Recent Labs  05/13/15 1335  TROPIPOC 0.03   Lab Results  Component Value Date   WBC 10.6* 05/13/2015   HGB 18.0* 05/13/2015   HCT 53.0* 05/13/2015   MCV 93.0 05/13/2015   PLT 230 05/13/2015    Recent Labs Lab 05/13/15 1341 05/13/15 1402  NA 138 138  K 4.6 4.5  CL 103 102  CO2 20*  --   BUN 40* 41*  CREATININE 2.18* 2.20*  CALCIUM 8.7*  --   PROT 6.2*  --  BILITOT 1.0  --   ALKPHOS 49  --   ALT 16*  --   AST 23  --   GLUCOSE 158* 169*   Lab Results  Component Value Date   CHOL 222* 01/10/2015   HDL 46.50 01/10/2015   LDLCALC 140* 01/10/2015   TRIG 174.0* 01/10/2015     Radiology Studies    Dg Chest Portable 1 View  05/13/2015  CLINICAL DATA:  Chest pain EXAM: PORTABLE CHEST 1 VIEW COMPARISON:  07/21/2012 chest radiograph. FINDINGS: External pacer pads overlies the left upper and left lower chest. Stable cardiomediastinal silhouette with top normal heart size. No pneumothorax. No pleural effusion. Cephalization of the pulmonary vasculature without overt pulmonary edema. No acute consolidative airspace disease. IMPRESSION: No active disease. Electronically Signed   By: Ilona Sorrel M.D.   On: 05/13/2015 13:42   ECG & Cardiac Imaging    AF, 60, antlat ST dep/TWI - more pronounced than on prior ecg's.  Assessment & Plan    1.  Symptomatic Bradycardia/Complete Heart Block:  Pt presented to the ED on 2/19 after experiencing vague chest and abd discomfort this AM followed by LH/presyncope, n, and vomiting after church.  He was not particularly bradycardic when seen by EMS.  In ED, he had prolonged heart block with loss of consciousness.  He had ROSC without req CPR or ACLS.  He has since been placed on zoll with pacer pads in place.  He remains in  AF. Last dose of lopressor was this AM.  Plan to admit, cycle CE.  Hold all antihypertensives in setting of bradycardia and AKI.  Check echo, Mg, TSH.  Watch tele.  Suspect he will require PPM following adequate bb washout.  2.  Chronic Atrial fibrillation:  See #1. Hold bb.  CHA2DS2VASc = 3 (HTN, Age x 2).  Ideally should be on [facility]  however he has concerns about cost.  Will ask case mgmt to look into eliquis.  Follow renal fxn before making any decisions.  He does not appear to be too interested in coumadin.  3.  Hypertensive Heart Disease:  Stable  Hold all antihypertensives.  4.  AKI:  Placed on ARB/HCTZ combo in October.  Has had bmet's since then with rise in creat to 1.58 as of 2/15.  Creat 2.2 today.  He says that he has been eating and drinking ok.  ? Period of prolonged hypotension after church.  He is receiving IVF in ED.  Hold ARB/HCTZ.  5.  HL:  LDL 140.  Utility of statin for primary prevention in a gentleman of 43 is unclear.   Signed, Murray Hodgkins, NP 05/13/2015, 2:58 PM  I have seen, examined the patient, and reviewed the above assessment and plan.  On exam, iRRR. Changes to above are made where necessary.  Pt with permanent atrial fibrillation and now ventricular standstill for a prolonged period of time.  He takes metoprolol 100mg  BID.  Per guidelines, will stop metoprolol and monitor closely in the hospital.  If he continues to have symptomatic bradycardia after medicine washout will require pacing.  Co Sign: Thompson Grayer, MD 05/13/2015 3:28 PM

## 2015-05-13 NOTE — ED Notes (Signed)
EMT darryl at bedside and noticed pt. To be unresponsive have no cardiac activity for around 45 seconds. Pt. Aroused by sternal rub. RN, EMT, and EDP at bedside. Zoll pads applied and NS bolus started. EDP to give orders for next steps. Pt. Alert and oriented at this time.

## 2015-05-13 NOTE — ED Notes (Signed)
Cardiology at bedside.

## 2015-05-13 NOTE — ED Provider Notes (Signed)
CSN: [id number]     Arrival date & time 05/13/15  1308 History   First MD Initiated Contact with Patient 05/13/15 1318     Chief Complaint  Patient presents with  . Chest Pain     (Consider location/radiation/quality/duration/timing/severity/associated sxs/prior Treatment) HPI Patient has been having chest tightness this morning. Started around 9 AM at church. It has been waxing and waning. Around 12:30 patient began to get nausea and sweating. EMS was contacted. Per EMS patient was cool and diaphoretic when they arrived. He was given aspirin and Zofran. Reportedly blood pressures were soft so no nitroglycerin was administered. She had an episode of bradycardia during transport. This did resolve without intervention. Patient denies a prior history of heart attack. He reports he was started on a new or blood pressure medication within the past couple weeks. He takes metoprolol and Hyzaar was added. Upon arrival to the emergency department, when the tech was obtaining ECG, patient became bradycardic and then asystolic. Tech called for assistance and the patient's heart rate had already returned to the 60s and 70s spontaneously. The patient was awake and breathing spontaneously. He was diaphoretic but mental status is clear and no respiratory distress. Past Medical History  Diagnosis Date  . Hyperlipidemia   . Hypertensive heart disease   . Lumbar disc disease   . Osteoarthritis   . Chronic atrial fibrillation ([place])     a. 07/2012 Echo: EF 55-60%, no rwma, mildly dil RA/LA.  Marland Kitchen Syncope     a. 07/2012 - unclear etiology-->resulted in right tib/fib fx req ORIF/intramedullary nail of tibia.   Past Surgical History  Procedure Laterality Date  . Total hip arthroplasty      left  . Ankle surgery      right ankle  . Tibia im nail insertion Right 07/21/2012    Procedure: INTRAMEDULLARY (IM) NAIL TIBIAL;  Surgeon: Johnny Bridge, MD;  Location: [facility];  Service: Orthopedics;  Laterality: Right;    Family History  Problem Relation Age of Onset  . Cancer Mother   . Hyperlipidemia Sister   . Hypertension Sister    Social History  Substance Use Topics  . Smoking status: Never Smoker   . Smokeless tobacco: Never Used  . Alcohol Use: 2.4 oz/week    4 Cans of beer per week     Comment: 2 beers several x/wk and occas cocktail on top of it.    Review of Systems  10 Systems reviewed and are negative for acute change except as noted in the HPI.   Allergies  Review of patient's allergies indicates no known allergies.  Home Medications   Prior to Admission medications   Medication Sig Start Date End Date Taking? Authorizing Provider  aspirin 81 MG tablet Take 81 mg by mouth daily.   Yes Historical Provider, MD  losartan-hydrochlorothiazide (HYZAAR) 100-25 MG tablet Take 1 tablet by mouth daily. 03/08/15  Yes Venia Carbon, MD  metoprolol (LOPRESSOR) 100 MG tablet TAKE 1 TABLET TWICE A DAY 03/05/15  Yes Venia Carbon, MD   BP 123/77 mmHg  Temp(Src) 97.7 F (36.5 C) (Oral)  Resp 24  Ht 5' 10.5" (1.791 m)  Wt 184 lb (83.462 kg)  BMI 26.02 kg/m2  SpO2 94% Physical Exam  Constitutional: He is oriented to person, place, and time. He appears well-developed and well-nourished.  Patient is pale and diaphoretic. No respiratory distress.  HENT:  Head: Normocephalic and atraumatic.  Mouth/Throat: Oropharynx is clear and moist.  Eyes: EOM are  normal. Pupils are equal, round, and reactive to light.  Neck: Neck supple.  Cardiovascular: Normal rate, normal heart sounds and intact distal pulses.   Irregularly irregular. No gross rub murmur or gallop.  Pulmonary/Chest: Effort normal and breath sounds normal.  Abdominal: Soft. Bowel sounds are normal. He exhibits no distension. There is no tenderness.  Musculoskeletal: Normal range of motion. He exhibits no edema or tenderness.  Neurological: He is alert and oriented to person, place, and time. He has normal strength.  Coordination normal. GCS eye subscore is 4. GCS verbal subscore is 5. GCS motor subscore is 6.  Skin: Skin is warm and intact. There is pallor.  Diaphoretic  Psychiatric: He has a normal mood and affect.    ED Course  Procedures (including critical care time) CRITICAL CARE Performed by: Charlesetta Shanks   Total critical care time: 30 minutes  Critical care time was exclusive of separately billable procedures and treating other patients.  Critical care was necessary to treat or prevent imminent or life-threatening deterioration.  Critical care was time spent personally by me on the following activities: development of treatment plan with patient and/or surrogate as well as nursing, discussions with consultants, evaluation of patient's response to treatment, examination of patient, obtaining history from patient or surrogate, ordering and performing treatments and interventions, ordering and review of laboratory studies, ordering and review of radiographic studies, pulse oximetry and re-evaluation of patient's condition. Labs Review Labs Reviewed  CBC WITH DIFFERENTIAL/PLATELET - Abnormal; Notable for the following:    WBC 10.6 (*)    Neutro Abs 7.8 (*)    All other components within normal limits  BRAIN NATRIURETIC PEPTIDE - Abnormal; Notable for the following:    B Natriuretic Peptide 251.3 (*)    All other components within normal limits  COMPREHENSIVE METABOLIC PANEL - Abnormal; Notable for the following:    CO2 20 (*)    Glucose, Bld 158 (*)    BUN 40 (*)    Creatinine, Ser 2.18 (*)    Calcium 8.7 (*)    Total Protein 6.2 (*)    Albumin 3.0 (*)    ALT 16 (*)    GFR calc non Af Amer 26 (*)    GFR calc Af Amer 30 (*)    All other components within normal limits  I-STAT CHEM 8, ED - Abnormal; Notable for the following:    BUN 41 (*)    Creatinine, Ser 2.20 (*)    Glucose, Bld 169 (*)    Hemoglobin 18.0 (*)    HCT 53.0 (*)    All other components within normal limits   PROTIME-INR  HEPARIN LEVEL (UNFRACTIONATED)  I-STAT TROPOININ, ED    Imaging Review Dg Chest Portable 1 View  05/13/2015  CLINICAL DATA:  Chest pain EXAM: PORTABLE CHEST 1 VIEW COMPARISON:  07/21/2012 chest radiograph. FINDINGS: External pacer pads overlies the left upper and left lower chest. Stable cardiomediastinal silhouette with top normal heart size. No pneumothorax. No pleural effusion. Cephalization of the pulmonary vasculature without overt pulmonary edema. No acute consolidative airspace disease. IMPRESSION: No active disease. Electronically Signed   By: Ilona Sorrel M.D.   On: 05/13/2015 13:42   I have personally reviewed and evaluated these images and lab results as part of my medical decision-making.   EKG Interpretation   Date/Time:  Sunday May 13 2015 13:10:01 EST Ventricular Rate:  60 PR Interval:    QRS Duration: 111 QT Interval:  467 QTC Calculation: 467 R Axis:  38 Text Interpretation:  Atrial fibrillation Abnormal R-wave progression,  early transition Repol abnrm suggests ischemia, anterolateral agree.  increase ant t wave inversion c/w old Confirmed by Johnney Killian, MD, Jeannie Done  [phone]) on 05/13/2015 1:22:22 PM     Consult: 13:25 cardiology consult and will see the patient in the emergency department.   MDM   Final diagnoses:  Chronic atrial fibrillation (HCC)  Asystole ([place])  Ischemic chest pain [facility])   Patient arrived as outlined above with episode of asystole on rhythm strip. This resolved to a narrow complex atrial fibrillation rate controlled upon my arrival into the room. Patient did not require chest compressions or airway support. With the patient having his. Chest tightness this morning and anterior T-wave inversions, heparin bolus and drip were initiated. Patient has already had aspirin by EMS. Cardiology has been consult. At this time, patient's mental status is clear, he is denying active chest tightness or pain, rhythm is rate controlled atrial  fibrillation, narrow complex. Old EKG shows history of atrial fibrillation. He has been evaluated by the cardiologist in the emergency department and will be admitted for ongoing treatment and management.    Charlesetta Shanks, MD 05/13/15 [phone]

## 2015-05-13 NOTE — Progress Notes (Signed)
ANTICOAGULATION CONSULT NOTE - Follow Up Consult  Pharmacy Consult for Heparin  Indication: chest pain/ACS  No Known Allergies  Patient Measurements: Height: 5' 10.5" (179.1 cm) Weight: 183 lb 11.2 oz (83.326 kg) IBW/kg (Calculated) : 74.15 Vital Signs: Temp: 97.5 F (36.4 C) (02/19 1958) Temp Source: Oral (02/19 1958) BP: 139/70 mmHg (02/19 1958) Pulse Rate: 78 (02/19 1958)  Labs:  Recent Labs  05/13/15 1341 05/13/15 1402 05/13/15 1815 05/13/15 2005  HGB 14.6 18.0*  --   --   HCT 41.4 53.0*  --   --   PLT 230  --   --   --   LABPROT 15.1  --   --   --   INR 1.17  --   --   --   HEPARINUNFRC  --   --   --  1.01*  CREATININE 2.18* 2.20*  --   --   TROPONINI  --   --  0.07*  --     Estimated Creatinine Clearance: 25.8 mL/min (by C-G formula based on Cr of 2.2).  Assessment: Heparin for CP, initial heparin level is elevated, no issues per RN.   Goal of Therapy:  Heparin level 0.3-0.7 units/ml Monitor platelets by anticoagulation protocol: Yes   Plan:  -Decrease heparin to 850 units/hr -0500 HL -Monitor for bleeding  Narda Bonds 05/13/2015,10:16 PM

## 2015-05-13 NOTE — ED Notes (Addendum)
Pt. Coming from church via [place] c/o chest discomfort starting around 0900. Pt. Reports that N/V started around 1230. Upon arrival EMS noted pt. To be cool/pale/diaphoretic. Pt. Given 8mg  zofran en route for N/V. Pt. afib on 12 lead with hx of the same. Pt. Given 324 ASA en route, but no nitro due to soft BP. Pt. Started on a NS bolus en route for BP. Pt. AOx4 at this time and still cool/diaphoretic.

## 2015-05-13 NOTE — Progress Notes (Signed)
ANTICOAGULATION CONSULT NOTE - Initial Consult  Pharmacy Consult for heparin Indication: chest pain/ACS  No Known Allergies  Patient Measurements: Height: 5' 10.5" (179.1 cm) Weight: 184 lb (83.462 kg) IBW/kg (Calculated) : 74.15  Vital Signs: Temp: 97.7 F (36.5 C) (02/19 1308) Temp Source: Oral (02/19 1308) BP: 123/77 mmHg (02/19 1308)  Labs: No results for input(s): HGB, HCT, PLT, APTT, LABPROT, INR, HEPARINUNFRC, HEPRLOWMOCWT, CREATININE, CKTOTAL, CKMB, TROPONINI in the last 72 hours.  Estimated Creatinine Clearance: 35.9 mL/min (by C-G formula based on Cr of 1.58).   Medical History: Past Medical History  Diagnosis Date  . Hyperlipidemia   . Hypertension   . Lumbar disc disease   . Osteoarthritis   . Atrial fibrillation [facility])    Assessment: 82 yo M presents on 2/19 with chest pain and discomfort. Pharmacy consulted to start heparin gtt. No anticoag PTA. Other labs pending.  Goal of Therapy:  Heparin level 0.3-0.7 units/ml Monitor platelets by anticoagulation protocol: Yes   Plan:  Give heparin 4,000 unit BOLUS Start heparin gtt at 1,050 units/hr Check 8 hr HL Monitor daily HL, CBC, s/s of bleed   Elenor Quinones, PharmD, BCPS Clinical Pharmacist Pager [phone] 05/13/2015 1:19 PM

## 2015-05-14 ENCOUNTER — Encounter: Payer: Self-pay | Admitting: *Deleted

## 2015-05-14 DIAGNOSIS — N179 Acute kidney failure, unspecified: Secondary | ICD-10-CM

## 2015-05-14 DIAGNOSIS — N189 Chronic kidney disease, unspecified: Secondary | ICD-10-CM

## 2015-05-14 LAB — HEPARIN LEVEL (UNFRACTIONATED): HEPARIN UNFRACTIONATED: 0.71 [IU]/mL — AB (ref 0.30–0.70)

## 2015-05-14 LAB — CBC
HCT: 39.4 % (ref 39.0–52.0)
HEMOGLOBIN: 13.7 g/dL (ref 13.0–17.0)
MCH: 32.8 pg (ref 26.0–34.0)
MCHC: 34.8 g/dL (ref 30.0–36.0)
MCV: 94.3 fL (ref 78.0–100.0)
PLATELETS: 219 10*3/uL (ref 150–400)
RBC: 4.18 MIL/uL — ABNORMAL LOW (ref 4.22–5.81)
RDW: 14.9 % (ref 11.5–15.5)
WBC: 11.5 10*3/uL — ABNORMAL HIGH (ref 4.0–10.5)

## 2015-05-14 LAB — BASIC METABOLIC PANEL
ANION GAP: 15 (ref 5–15)
BUN: 37 mg/dL — AB (ref 6–20)
CALCIUM: 9.1 mg/dL (ref 8.9–10.3)
CO2: 21 mmol/L — ABNORMAL LOW (ref 22–32)
CREATININE: 1.78 mg/dL — AB (ref 0.61–1.24)
Chloride: 104 mmol/L (ref 101–111)
GFR calc Af Amer: 38 mL/min — ABNORMAL LOW (ref >60)
GFR, EST NON AFRICAN AMERICAN: 33 mL/min — AB (ref >60)
GLUCOSE: 115 mg/dL — AB (ref 65–99)
Potassium: 4.5 mmol/L (ref 3.5–5.1)
Sodium: 140 mmol/L (ref 135–145)

## 2015-05-14 LAB — TROPONIN I
TROPONIN I: 0.05 ng/mL — AB (ref <0.031)
Troponin I: 0.03 ng/mL (ref <0.031)

## 2015-05-14 MED ORDER — TEMAZEPAM 15 MG PO CAPS
15.0000 mg | ORAL_CAPSULE | Freq: Once | ORAL | Status: AC
  Administered 2015-05-14: 15 mg via ORAL
  Filled 2015-05-14: qty 1

## 2015-05-14 NOTE — Progress Notes (Signed)
ANTICOAGULATION CONSULT NOTE - Follow Up Consult  Pharmacy Consult for Heparin  Indication: chest pain/ACS  No Known Allergies  Patient Measurements: Height: 5' 10.5" (179.1 cm) Weight: 182 lb 6.4 oz (82.736 kg) IBW/kg (Calculated) : 74.15 Vital Signs: Temp: 97.9 F (36.6 C) (02/20 0500) Temp Source: Oral (02/20 0500) BP: 150/87 mmHg (02/20 0500) Pulse Rate: 69 (02/20 0500)  Labs:  Recent Labs  05/13/15 1341 05/13/15 1402 05/13/15 1815 05/13/15 2005 05/13/15 2334 05/14/15 0536  HGB 14.6 18.0*  --   --   --  13.7  HCT 41.4 53.0*  --   --   --  39.4  PLT 230  --   --   --   --  219  LABPROT 15.1  --   --   --   --   --   INR 1.17  --   --   --   --   --   HEPARINUNFRC  --   --   --  1.01*  --  0.71*  CREATININE 2.18* 2.20*  --   --   --   --   TROPONINI  --   --  0.07*  --  0.05*  --     Estimated Creatinine Clearance: 25.8 mL/min (by C-G formula based on Cr of 2.2).  Assessment: Heparin for CP, heparin level still slightly above goal, no issues per RN.   Goal of Therapy:  Heparin level 0.3-0.7 units/ml Monitor platelets by anticoagulation protocol: Yes   Plan:  -Decrease heparin to 750 units/hr -1400 HL -Monitor for bleeding  Narda Bonds 05/14/2015,6:20 AM

## 2015-05-14 NOTE — Progress Notes (Signed)
SUBJECTIVE: The patient is doing well today.  At this time, he denies chest pain, shortness of breath, or any new concerns.  No further bradycardia and no presyncope or syncope.  Marland Kitchen aspirin  81 mg Oral Daily   . sodium chloride 50 mL/hr at 05/13/15 1843    OBJECTIVE: Physical Exam: Filed Vitals:   05/13/15 1958 05/14/15 0037 05/14/15 0500 05/14/15 0750  BP: 139/70 133/76 150/87 145/72  Pulse: 78 74 69 67  Temp: 97.5 F (36.4 C) 97.4 F (36.3 C) 97.9 F (36.6 C) 97.9 F (36.6 C)  TempSrc: Oral Oral Oral Oral  Resp:  17 18 20   Height:      Weight:   182 lb 6.4 oz (82.736 kg)   SpO2: 98% 97% 99% 99%    Intake/Output Summary (Last 24 hours) at 05/14/15 0813 Last data filed at 05/14/15 0810  Gross per 24 hour  Intake      0 ml  Output    600 ml  Net   -600 ml    Telemetry reveals rate controlled afib  GEN- The patient is elderly appearing, alert and oriented x 3 today.   Head- normocephalic, atraumatic Eyes-  Sclera clear, conjunctiva pink Ears- hearing intact Oropharynx- clear Neck- supple,   Lungs- Clear to ausculation bilaterally, normal work of breathing Heart- irregular rate and rhythm  GI- soft, NT, ND, + BS Extremities- no clubbing, cyanosis, or edema Skin- no rash or lesion Psych- euthymic mood, full affect Neuro- strength and sensation are intact  LABS: Basic Metabolic Panel:  Recent Labs  05/13/15 1341 05/13/15 1402 05/13/15 1815 05/14/15 0536  NA 138 138  --  140  K 4.6 4.5  --  4.5  CL 103 102  --  104  CO2 20*  --   --  21*  GLUCOSE 158* 169*  --  115*  BUN 40* 41*  --  37*  CREATININE 2.18* 2.20*  --  1.78*  CALCIUM 8.7*  --   --  9.1  MG  --   --  1.9  --    Liver Function Tests:  Recent Labs  05/13/15 1341  AST 23  ALT 16*  ALKPHOS 49  BILITOT 1.0  PROT 6.2*  ALBUMIN 3.0*   No results for input(s): LIPASE, AMYLASE in the last 72 hours. CBC:  Recent Labs  05/13/15 1341 05/13/15 1402 05/14/15 0536  WBC 10.6*  --   11.5*  NEUTROABS 7.8*  --   --   HGB 14.6 18.0* 13.7  HCT 41.4 53.0* 39.4  MCV 93.0  --  94.3  PLT 230  --  219   Cardiac Enzymes:  Recent Labs  05/13/15 1815 05/13/15 2334 05/14/15 0536  TROPONINI 0.07* 0.05* 0.03   Thyroid Function Tests:  Recent Labs  05/13/15 1815  TSH 4.185    ASSESSMENT AND PLAN:  1. Symptomatic bradycardia with asystole No further episodes off of metoprolol Will continue to observe for another day.  IF he has additional pauses, will proceed with PPM. IF no further episodes, possibly to discharge tomorrow. Echo is pending  2. Permanent afib As creatinine improves will consider NOAC therapy Stop heparin drip at this time and follow closely.  chads2vasc score is 3.  3. Acute on chronic renal failure Likely due to dehydration Improving with holding hctz and ace inhibitor  4. Hypertensive Heart Disease: Stable Holding all antihypertensives.  5. HL: LDL 140. Utility of statin for primary prevention in a gentleman of 59 is  unclear.  I think that risks may outweight benefit and will thus not start a statin at this time.   Thompson Grayer, MD 05/14/2015 8:13 AM

## 2015-05-14 NOTE — Plan of Care (Signed)
Problem: Safety: Goal: Ability to remain free from injury will improve Outcome: Completed/Met Date Met:  05/14/15 Pt educated on safety measures put into place. Pt verbalized understanding.

## 2015-05-14 NOTE — Discharge Summary (Signed)
ELECTROPHYSIOLOGY PROCEDURE DISCHARGE SUMMARY    Patient ID: Martin Conner,  MRN: [id number], DOB/AGE: 08-28-1927 82 y.o.  Admit date: 05/13/2015 Discharge date: 05/15/2015  Primary Care Physician: Viviana Simpler, MD Electrophysiologist: Analese Sovine  Primary Discharge Diagnosis:  Active Problems:   Complete heart block ([place])   Acute on chronic renal failure (HCC)   No Known Allergies  Brief HPI/Hospital Course:  Martin Conner is a 82 y.o. male with a past medical history significant for permanent atrial fibrillation, prior syncope, hypertension, and hyperlipidemia who presented on the day of admission with pre-syncope and nausea.  While in the er, he had an episode of asystole. He was admitted for further evaluation and his BB was held without further pauses or symptomatic bradycardia.  Lab work was notable for acute on chronic renal failure that improved with holding of diuretic and ARB. Echo demonstrated EF 55-60%, no RWMA, mild MR, LA severely dilated, trivial TR.  At discharge, he will be restarted on Losartan 50mg  daily.  HCTZ and Metoprolol are discontinued. Plan to follow up in office in 4 weeks.  If recurrent syncope, will need pacemaker.  No driving x6 months.   CHADS2VASC is 3.  Dr Rayann Heman had a long discussion with the patient today who at this time is refusing De[place].  He prefers to discuss with PCP at followup. Pt encouraged to make appointment to discuss.   Physical Exam: Filed Vitals:   05/14/15 1730 05/14/15 1941 05/14/15 2336 05/15/15 0503  BP: 172/67 151/70 139/78 149/92  Pulse: 89 69 85 79  Temp: 98.9 F (37.2 C) 97.8 F (36.6 C) 97.8 F (36.6 C) 98.1 F (36.7 C)  TempSrc: Oral Oral Oral Oral  Resp: 20 18 17 20   Height:      Weight:    182 lb 14.4 oz (82.963 kg)  SpO2: 95% 97% 95% 98%   GEN- The patient is elderly appearing, alert and oriented x 3 today.  Head- normocephalic, atraumatic Eyes- Sclera clear, conjunctiva pink Ears- hearing intact Oropharynx-  clear Neck- supple,  Lungs- Clear to ausculation bilaterally, normal work of breathing Heart- irregular rate and rhythm  GI- soft, NT, ND, + BS Extremities- no clubbing, cyanosis, or edema Skin- no rash or lesion Psych- euthymic mood, full affect Neuro- strength and sensation are intact    Labs:   Lab Results  Component Value Date   WBC 6.1 05/15/2015   HGB 13.1 05/15/2015   HCT 38.3* 05/15/2015   MCV 95.8 05/15/2015   PLT 191 05/15/2015    Recent Labs Lab 05/13/15 1341  05/15/15 0536  NA 138  < > 139  K 4.6  < > 4.4  CL 103  < > 105  CO2 20*  < > 23  BUN 40*  < > 28*  CREATININE 2.18*  < > 1.41*  CALCIUM 8.7*  < > 8.7*  PROT 6.2*  --   --   BILITOT 1.0  --   --   ALKPHOS 49  --   --   ALT 16*  --   --   AST 23  --   --   GLUCOSE 158*  < > 103*  < > = values in this interval not displayed.   Discharge Medications:  Current Discharge Medication List    START taking these medications   Details  losartan (COZAAR) 50 MG tablet Take 1 tablet (50 mg total) by mouth daily. Qty: 30 tablet, Refills: 1      CONTINUE  these medications which have NOT CHANGED   Details  aspirin 81 MG tablet Take 81 mg by mouth daily.      STOP taking these medications     losartan-hydrochlorothiazide (HYZAAR) 100-25 MG tablet      metoprolol (LOPRESSOR) 100 MG tablet         Disposition: Pt is being discharged home today in good condition. Discharge Instructions    Diet - low sodium heart healthy    Complete by:  As directed      Discharge instructions    Complete by:  As directed   No driving x6 months     Increase activity slowly    Complete by:  As directed           Follow-up Information    Follow up with Patsey Berthold, NP On 06/06/2015.   Specialty:  Cardiology   Why:  at 9:20AM   Contact information:   [place] [address] [phone]       Duration of Discharge Encounter: Greater than 30 minutes including physician  time.  Signed, Chanetta Marshall, NP 05/15/2015 12:40 PM   I have seen, examined the patient, and reviewed the above assessment and plan.  Changes to above are made where necessary.    Co Sign: Thompson Grayer, MD 05/15/2015 6:16 PM

## 2015-05-15 ENCOUNTER — Inpatient Hospital Stay (HOSPITAL_COMMUNITY): Payer: Medicare Other

## 2015-05-15 ENCOUNTER — Other Ambulatory Visit (HOSPITAL_COMMUNITY): Payer: Medicare Other

## 2015-05-15 DIAGNOSIS — I4891 Unspecified atrial fibrillation: Secondary | ICD-10-CM

## 2015-05-15 LAB — CBC
HCT: 38.3 % — ABNORMAL LOW (ref 39.0–52.0)
Hemoglobin: 13.1 g/dL (ref 13.0–17.0)
MCH: 32.8 pg (ref 26.0–34.0)
MCHC: 34.2 g/dL (ref 30.0–36.0)
MCV: 95.8 fL (ref 78.0–100.0)
PLATELETS: 191 10*3/uL (ref 150–400)
RBC: 4 MIL/uL — AB (ref 4.22–5.81)
RDW: 14.9 % (ref 11.5–15.5)
WBC: 6.1 10*3/uL (ref 4.0–10.5)

## 2015-05-15 LAB — BASIC METABOLIC PANEL
Anion gap: 11 (ref 5–15)
BUN: 28 mg/dL — AB (ref 6–20)
CALCIUM: 8.7 mg/dL — AB (ref 8.9–10.3)
CHLORIDE: 105 mmol/L (ref 101–111)
CO2: 23 mmol/L (ref 22–32)
CREATININE: 1.41 mg/dL — AB (ref 0.61–1.24)
GFR, EST AFRICAN AMERICAN: 51 mL/min — AB (ref >60)
GFR, EST NON AFRICAN AMERICAN: 44 mL/min — AB (ref >60)
Glucose, Bld: 103 mg/dL — ABNORMAL HIGH (ref 65–99)
Potassium: 4.4 mmol/L (ref 3.5–5.1)
SODIUM: 139 mmol/L (ref 135–145)

## 2015-05-15 MED ORDER — LOSARTAN POTASSIUM 50 MG PO TABS: 50.0000 mg | ORAL_TABLET | Freq: Every day | ORAL | Status: DC

## 2015-05-15 NOTE — Progress Notes (Signed)
Echocardiogram 2D Echocardiogram has been performed.  Martin Conner 05/15/2015, 1:37 PM

## 2015-05-15 NOTE — Discharge Instructions (Signed)
Pacemaker Implantation The heart has its own electrical system, or natural pacemaker, to regulate the heartbeat. Sometimes, the natural pacemaker system of the heart fails and causes the heart to beat too slowly. If this happens, a pacemaker can be surgically placed to help the heart beat at a normal or programmed rate. A pacemaker is a small, battery-powered device that is placed under the skin and is programmed to sense your heartbeats. If your heart rate is lower than the programmed rate, the pacemaker will pace your heart. Parts of a pacemaker include:  Wires or leads. The leads are placed in the heart and transmit electricity to the heart. The leads are connected to the pulse generator.  Pulse generator. The pulse generator contains a computer and a memory system. The pulse generator also produces the electrical signal that triggers the heart to beat. A pacemaker may be placed if:  You have a slow heartbeat (bradycardia).  You have fainting (syncope).  Shortness of breath (dyspnea) due to heart problems. LET [facility] CARE PROVIDER KNOW ABOUT:  Any allergies you may have.  All medicines you are taking, including vitamins, herbs, eye drops, creams, and over-the-counter medicines.  Previous problems you or members of your family have had with the use of anesthetics.  Any blood disorders you have.  Previous surgeries you have had.  Medical conditions you have.  Possibility of pregnancy, if this applies. RISKS AND COMPLICATIONS Generally, pacemaker implantation is a safe procedure. However, problems can occur and include:  Bleeding.  Unable to place the pacemaker under local sedation.  Infection. BEFORE THE PROCEDURE  You will have blood work drawn before the procedure.  Do not use any tobacco products including cigarettes, chewing tobacco, or electronic cigarettes. If you need help quitting, ask your health care provider.  Do not eat or drink anything after midnight on  the night before the procedure or as directed by your health care provider.  Ask your health care provider about:  Changing or stopping your regular medicines. This is especially important if you are taking diabetes medicines or blood thinners.  Taking medicines such as aspirin and ibuprofen. These medicines can thin your blood. Do not take these medicines before your procedure if your health care provider asks you not to.  Ask your health care provider if you can take a sip of water with any approved medicines the morning of the procedure. PROCEDURE  The surgery to place a pacemaker is considered a minimally invasive surgical procedure. It is done under a local anesthetic, which is an injection at the incision site that makes the skin numb. You are also given sedation and pain medicine that makes you drowsy during the procedure.   An intravenous line (IV) will be started in your hand or arm so sedation and pain medicine can be given during the pacemaker procedure.  A numbing medicine will be injected into the skin where the pacemaker is to be placed. A small incision will then be made into the skin. The pacemaker is usually placed under the skin near the collarbone.  After the incision has been made, the leads will be inserted into a large vein and guided into the heart using X-ray.  Using the same incision that was used to place the leads, a small pocket will be created under the skin to hold the pulse generator. The leads will then be connected to the pulse generator.  The incision site will then be closed. A bandage (dressing) is placed over the  pacemaker site. The dressing is removed 24-48 hours afterward. AFTER THE PROCEDURE  You will be taken to a recovery area after the pacemaker implant. Your vital signs such as blood pressure, heart rate, breathing, and oxygen levels will be monitored.  A chest X-ray will be done after the pacemaker has been implanted. This is to make sure the  pacemaker and leads are in the correct place.   This information is not intended to replace advice given to you by your health care provider. Make sure you discuss any questions you have with your health care provider.   Document Released: 02/28/2002 Document Revised: 03/31/2014 Document Reviewed: 07/15/2011 Elsevier Interactive Patient Education [employer].

## 2015-05-23 ENCOUNTER — Encounter: Payer: Self-pay | Admitting: Internal Medicine

## 2015-05-23 ENCOUNTER — Ambulatory Visit (INDEPENDENT_AMBULATORY_CARE_PROVIDER_SITE_OTHER): Payer: Medicare Other | Admitting: Internal Medicine

## 2015-05-23 VITALS — BP 168/100 | HR 99 | Temp 98.6°F | Wt 185.0 lb

## 2015-05-23 DIAGNOSIS — I482 Chronic atrial fibrillation, unspecified: Secondary | ICD-10-CM

## 2015-05-23 DIAGNOSIS — I10 Essential (primary) hypertension: Secondary | ICD-10-CM

## 2015-05-23 DIAGNOSIS — I469 Cardiac arrest, cause unspecified: Secondary | ICD-10-CM

## 2015-05-23 MED ORDER — LOSARTAN POTASSIUM 100 MG PO TABS: 100.0000 mg | ORAL_TABLET | Freq: Every day | ORAL | Status: DC

## 2015-05-23 MED ORDER — DABIGATRAN ETEXILATE MESYLATE 110 MG PO CAPS: 1.0000 | ORAL_CAPSULE | Freq: Two times a day (BID) | ORAL | Status: DC

## 2015-05-23 NOTE — Assessment & Plan Note (Signed)
BP Readings from Last 3 Encounters:  05/23/15 168/100  05/15/15 149/92  05/09/15 120/70   Repeat 166/90 on right Numbers okay at home Will double the losartan---then add back HCTZ if it remains high

## 2015-05-23 NOTE — Progress Notes (Signed)
   Subjective:    Patient ID: Martin Conner, male    DOB: 1927/08/17, 82 y.o.   MRN: [id number]  HPI Here for hospital follow up Reviewed hospital records He feels fine Checking his own BP-- 120/87-157/86  Asystole for 20 seconds apparently--- at first rate was 20 with EMS Off beta blocker and BP meds Now back on lower dose losartan  No dizziness since home--even getting up No chest pain No palpitations He feels his stamina is some better since off meds  Still just on ASA Not willing to do NOAC due to cost Has heard pradaxa is going generic--but not there yet  Current Outpatient Prescriptions on File Prior to Visit  Medication Sig Dispense Refill  . losartan (COZAAR) 50 MG tablet Take 1 tablet (50 mg total) by mouth daily. 30 tablet 1   No current facility-administered medications on file prior to visit.    No Known Allergies  Past Medical History  Diagnosis Date  . Hyperlipidemia   . Hypertensive heart disease   . Lumbar disc disease   . Osteoarthritis   . Chronic atrial fibrillation ([place])     a. 07/2012 Echo: EF 55-60%, no rwma, mildly dil RA/LA.  Marland Kitchen Syncope     a. 07/2012 - unclear etiology-->resulted in right tib/fib fx req ORIF/intramedullary nail of tibia.    Past Surgical History  Procedure Laterality Date  . Total hip arthroplasty      left  . Ankle surgery      right ankle  . Tibia im nail insertion Right 07/21/2012    Procedure: INTRAMEDULLARY (IM) NAIL TIBIAL;  Surgeon: Johnny Bridge, MD;  Location: [facility];  Service: Orthopedics;  Laterality: Right;    Family History  Problem Relation Age of Onset  . Cancer Mother   . Hyperlipidemia Sister   . Hypertension Sister     Social History   Social History  . Marital Status: Married    Spouse Name: N/A  . Number of Children: 2  . Years of Education: N/A   Occupational History  . retired  At [employer]   Social History Main Topics  . Smoking status: Never Smoker   . Smokeless tobacco: Never Used  .  Alcohol Use: 7.2 oz/week    12 Cans of beer per week     Comment: 2 beers several x/wk and occas cocktail on top of it.  . Drug Use: No  . Sexual Activity: Not on file   Other Topics Concern  . Not on file   Social History Narrative   No living will   Requests that wife make decisions for him if needed   Would accept resuscitation but no prolonged artificial ventilation   Would not want tube feeds if cognitively unaware   Review of Systems Appetite is picked up---eating well No edema    Objective:   Physical Exam  Constitutional: He appears well-nourished. No distress.  Neck: Normal range of motion. Neck supple. No thyromegaly present.  Cardiovascular: Normal rate.  Exam reveals no gallop.   No murmur heard. Rate in 80's now irregular  Pulmonary/Chest: Effort normal and breath sounds normal. No respiratory distress. He has no wheezes. He has no rales.  Musculoskeletal: He exhibits no edema.  Lymphadenopathy:    He has no cervical adenopathy.          Assessment & Plan:

## 2015-05-23 NOTE — Assessment & Plan Note (Addendum)
Rate is okay now If he has tachycardia, will need meds again (and pacer due to this event) Discussed NOAC--he is willing to try pradaxa if not too expensive now (with hopes of decrease in the next year)

## 2015-05-23 NOTE — Assessment & Plan Note (Signed)
20 seconds in ER No problems off beta blocker No pacer for now

## 2015-05-23 NOTE — Patient Instructions (Signed)
When the pradaxa comes---start it twice a day and stop the aspirin. Double the losartan--take 2 of the 50mg  size until the 100mg  tabs come.

## 2015-05-23 NOTE — Progress Notes (Signed)
Pre visit review using our clinic review tool, if applicable. No additional management support is needed unless otherwise documented below in the visit note. 

## 2015-06-01 ENCOUNTER — Other Ambulatory Visit: Payer: Self-pay | Admitting: Internal Medicine

## 2015-06-01 NOTE — Telephone Encounter (Signed)
Spoke to patient. He is not on metoprolol. I refused the refill.

## 2015-06-01 NOTE — Telephone Encounter (Signed)
Left message for patient to verify he is on metoprolol

## 2015-06-05 NOTE — Progress Notes (Addendum)
Electrophysiology Office Note Date: 06/06/2015  ID:  Martin Conner, DOB Jan 29, 1928, MRN [id number]  PCP: Martin Simpler, MD Electrophysiologist: Martin Conner  CC: hospital follow up  Martin Conner is a 82 y.o. male seen today for Martin Conner.  He was recently admitted with pre-syncope and nausea. While in the ER, he had an episode of asystole. He was admitted for further evaluation and his BB was held without further pauses or symptomatic bradycardia. Lab work was notable for acute on chronic renal failure that improved with holding of diuretic and ARB. Echo demonstrated EF 55-60%, no RWMA, mild MR, LA severely dilated, trivial TR. Since discharge, the patient reports doing very well.  He denies chest pain, palpitations, dyspnea, PND, orthopnea, nausea, vomiting, dizziness, syncope, edema, weight gain, or early satiety.  Past Medical History  Diagnosis Date  . Hyperlipidemia   . Hypertensive heart disease   . Lumbar disc disease   . Osteoarthritis   . Chronic atrial fibrillation ([place])     a. 07/2012 Echo: EF 55-60%, no rwma, mildly dil RA/LA.  Marland Kitchen Syncope     a. 07/2012 - unclear etiology-->resulted in right tib/fib fx req ORIF/intramedullary nail of tibia.   Past Surgical History  Procedure Laterality Date  . Total hip arthroplasty      left  . Ankle surgery      right ankle  . Tibia im nail insertion Right 07/21/2012    Procedure: INTRAMEDULLARY (IM) NAIL TIBIAL;  Surgeon: Martin Bridge, MD;  Location: [facility];  Service: Orthopedics;  Laterality: Right;    Current Outpatient Prescriptions  Medication Sig Dispense Refill  . aspirin 325 MG tablet Take 325 mg by mouth daily.    Marland Kitchen losartan (COZAAR) 50 MG tablet Take 1 tablet by mouth daily.     No current facility-administered medications for this visit.    Allergies:   Review of patient's allergies indicates no known allergies.   Social History: Social History   Social History  . Marital Status: Married    Spouse Name: N/A  .  Number of Children: 2  . Years of Education: N/A   Occupational History  . retired  [employer]   Social History Main Topics  . Smoking status: Never Smoker   . Smokeless tobacco: Never Used  . Alcohol Use: 7.2 oz/week    12 Cans of beer per week     Comment: 2 beers several x/wk and occas cocktail on top of it.  . Drug Use: No  . Sexual Activity: Not on file   Other Topics Concern  . Not on file   Social History Narrative   No living will   Requests that wife make decisions for him if needed   Would accept resuscitation but no prolonged artificial ventilation   Would not want tube feeds if cognitively unaware    Family History: Family History  Problem Relation Age of Onset  . Cancer Mother   . Hyperlipidemia Sister   . Hypertension Sister     Review of Systems: All other systems reviewed and are otherwise negative except as noted above.   Physical Exam: VS:  BP 178/88 mmHg  Pulse 121  Ht 5' 10.5" (1.791 m)  Wt 184 lb 6.4 oz (83.643 kg)  BMI 26.08 kg/m2 , BMI Body mass index is 26.08 kg/(m^2). Wt Readings from Last 3 Encounters:  06/06/15 184 lb 6.4 oz (83.643 kg)  05/23/15 185 lb (83.915 kg)  05/15/15 182 lb 14.4 oz (  82.963 kg)    GEN- The patient is elderly appearing, alert and oriented x 3 today.   HEENT: normocephalic, atraumatic; sclera clear, conjunctiva pink; hearing intact; oropharynx clear; neck supple Lungs- Clear to ausculation bilaterally, normal work of breathing.  No wheezes, rales, rhonchi Heart- Irregular rate and rhythm  GI- soft, non-tender, non-distended, bowel sounds present  Extremities- no clubbing, cyanosis, or edema; DP/PT/radial pulses 2+ bilaterally MS- no significant deformity or atrophy Skin- warm and dry, no rash or lesion  Psych- euthymic mood, full affect Neuro- strength and sensation are intact   EKG:  EKG is ordered today. The ekg ordered today shows atrial fibrillation, ventricular rate 121, iRBBB  Recent Labs: 05/13/2015:  ALT 16*; B Natriuretic Peptide 251.3*; Magnesium 1.9; TSH 4.185 05/15/2015: BUN 28*; Creatinine, Ser 1.41*; Hemoglobin 13.1; Platelets 191; Potassium 4.4; Sodium 139    Other studies Reviewed: Additional studies/ records that were reviewed today include: hospital records   Assessment and Plan: 1.  Transient complete heart block No syncope recurrence off of BB Continue to monitor for now If recurrent syncope/pre-syncope, will need PPM implant, discussed with patient today  2.  Permanent atrial fibrillation V rate elevated today - pt is having foot pain, will follow for now (recheck by me 87) If V rates remain elevated, may dictate need for PPM for tachy/brady CHADS2VASC is 3, Pradaxa started by Martin Martin Conner - pt awaiting mail order Rx - advised to discontinue ASA when Pradaxa started  3.  Acute on chronic renal failure Recheck BMET today  4.  HTN Elevated today Has not taken meds Martin Martin Conner recently increased Losartan - follow up scheduled in 2 weeks   Current medicines are reviewed at length with the patient today.   The patient does not have concerns regarding his medicines.  The following changes were made today:  none  Labs/ tests ordered today include: BMET    Disposition:   Follow up with Martin Conner 3 months, sooner if recurrent syncope/pre-syncope      Signed, Martin Marshall, NP 06/06/2015 9:21 AM   [name] [id number] [phone] (office) [phone] (fax)

## 2015-06-06 ENCOUNTER — Ambulatory Visit (INDEPENDENT_AMBULATORY_CARE_PROVIDER_SITE_OTHER): Payer: Medicare Other | Admitting: Nurse Practitioner

## 2015-06-06 ENCOUNTER — Encounter: Payer: Self-pay | Admitting: Nurse Practitioner

## 2015-06-06 VITALS — BP 178/88 | HR 121 | Ht 70.5 in | Wt 184.4 lb

## 2015-06-06 DIAGNOSIS — I10 Essential (primary) hypertension: Secondary | ICD-10-CM

## 2015-06-06 DIAGNOSIS — I482 Chronic atrial fibrillation: Secondary | ICD-10-CM | POA: Diagnosis not present

## 2015-06-06 DIAGNOSIS — I442 Atrioventricular block, complete: Secondary | ICD-10-CM

## 2015-06-06 DIAGNOSIS — I4821 Permanent atrial fibrillation: Secondary | ICD-10-CM

## 2015-06-06 LAB — BASIC METABOLIC PANEL
BUN: 14 mg/dL (ref 7–25)
CHLORIDE: 105 mmol/L (ref 98–110)
CO2: 24 mmol/L (ref 20–31)
CREATININE: 1.2 mg/dL — AB (ref 0.70–1.11)
Calcium: 9.4 mg/dL (ref 8.6–10.3)
GLUCOSE: 98 mg/dL (ref 65–99)
Potassium: 4.6 mmol/L (ref 3.5–5.3)
SODIUM: 140 mmol/L (ref 135–146)

## 2015-06-06 NOTE — Patient Instructions (Addendum)
Medication Instructions:   Your physician recommends that you continue on your current medications as directed. Please refer to the Current Medication list given to you today.   If you need a refill on your cardiac medications before your next appointment, please call your pharmacy.  Labwork: BMET    Testing/Procedures: NONE ORDER TODAY    Follow-Up: IN 3 MONTHS WTH DR ALLRED    Any Other Special Instructions Will Be Listed Below (If Applicable).

## 2015-06-07 ENCOUNTER — Telehealth: Payer: Self-pay | Admitting: *Deleted

## 2015-06-07 NOTE — Telephone Encounter (Signed)
-----   Message from Patsey Berthold, NP sent at 06/07/2015  7:21 AM EDT ----- Please notify patient of results - kidney function improved.

## 2015-06-08 ENCOUNTER — Telehealth: Payer: Self-pay | Admitting: *Deleted

## 2015-06-08 NOTE — Telephone Encounter (Signed)
-----   Message from Patsey Berthold, NP sent at 06/07/2015  7:21 AM EDT ----- Please notify patient of results - kidney function improved.

## 2015-06-26 ENCOUNTER — Ambulatory Visit (INDEPENDENT_AMBULATORY_CARE_PROVIDER_SITE_OTHER): Payer: Medicare Other | Admitting: Internal Medicine

## 2015-06-26 ENCOUNTER — Encounter: Payer: Self-pay | Admitting: Internal Medicine

## 2015-06-26 VITALS — BP 130/80 | HR 102 | Temp 97.4°F | Wt 179.5 lb

## 2015-06-26 DIAGNOSIS — I4819 Other persistent atrial fibrillation: Secondary | ICD-10-CM

## 2015-06-26 DIAGNOSIS — I10 Essential (primary) hypertension: Secondary | ICD-10-CM

## 2015-06-26 DIAGNOSIS — I481 Persistent atrial fibrillation: Secondary | ICD-10-CM | POA: Diagnosis not present

## 2015-06-26 LAB — CBC WITH DIFFERENTIAL/PLATELET
BASOS PCT: 0.9 % (ref 0.0–3.0)
Basophils Absolute: 0.1 10*3/uL (ref 0.0–0.1)
EOS PCT: 1.8 % (ref 0.0–5.0)
Eosinophils Absolute: 0.2 10*3/uL (ref 0.0–0.7)
HCT: 45.5 % (ref 39.0–52.0)
HEMOGLOBIN: 15.4 g/dL (ref 13.0–17.0)
LYMPHS ABS: 1.8 10*3/uL (ref 0.7–4.0)
Lymphocytes Relative: 19.1 % (ref 12.0–46.0)
MCHC: 33.8 g/dL (ref 30.0–36.0)
MCV: 96.9 fl (ref 78.0–100.0)
MONO ABS: 1 10*3/uL (ref 0.1–1.0)
Monocytes Relative: 10.1 % (ref 3.0–12.0)
Neutro Abs: 6.5 10*3/uL (ref 1.4–7.7)
Neutrophils Relative %: 68.1 % (ref 43.0–77.0)
Platelets: 262 10*3/uL (ref 150.0–400.0)
RBC: 4.69 Mil/uL (ref 4.22–5.81)
RDW: 14.6 % (ref 11.5–15.5)
WBC: 9.5 10*3/uL (ref 4.0–10.5)

## 2015-06-26 LAB — RENAL FUNCTION PANEL
ALBUMIN: 4.3 g/dL (ref 3.5–5.2)
BUN: 31 mg/dL — ABNORMAL HIGH (ref 6–23)
CALCIUM: 10.1 mg/dL (ref 8.4–10.5)
CHLORIDE: 99 meq/L (ref 96–112)
CO2: 26 mEq/L (ref 19–32)
Creatinine, Ser: 1.49 mg/dL (ref 0.40–1.50)
GFR: 47.57 mL/min — ABNORMAL LOW (ref >60.00)
Glucose, Bld: 112 mg/dL — ABNORMAL HIGH (ref 70–99)
POTASSIUM: 4.1 meq/L (ref 3.5–5.1)
Phosphorus: 3.3 mg/dL (ref 2.3–4.6)
Sodium: 135 mEq/L (ref 135–145)

## 2015-06-26 NOTE — Patient Instructions (Signed)
Please try the losartan at night--or split the dose in half twice a day. If your home blood pressure is under 120 on top, we probably need to decrease the dose again.

## 2015-06-26 NOTE — Progress Notes (Signed)
Pre visit review using our clinic review tool, if applicable. No additional management support is needed unless otherwise documented below in the visit note. 

## 2015-06-26 NOTE — Assessment & Plan Note (Signed)
Rate is reasonable Wouldn't use beta blocker or CCB due to asystole Now on pradaxa

## 2015-06-26 NOTE — Progress Notes (Signed)
   Subjective:    Patient ID: Martin Conner, male    DOB: [birth year], 82 y.o.   MRN: [id number]  HPI Here for follow up of HTN Here with wife  Feels he is not doing that great Feels weak at times and tired Sleeping a lot per wife Wonders if the losartan increase is causing this  Did start the pradaxa last week Took a while to come  No palpitations No chest pain No dizziness or syncope No edema   No current outpatient prescriptions on file prior to visit.   No current facility-administered medications on file prior to visit.    No Known Allergies  Past Medical History  Diagnosis Date  . Hyperlipidemia   . Hypertensive heart disease   . Lumbar disc disease   . Osteoarthritis   . Chronic atrial fibrillation ([place])     a. 07/2012 Echo: EF 55-60%, no rwma, mildly dil RA/LA.  Marland Kitchen Syncope     a. 07/2012 - unclear etiology-->resulted in right tib/fib fx req ORIF/intramedullary nail of tibia.    Past Surgical History  Procedure Laterality Date  . Total hip arthroplasty      left  . Ankle surgery      right ankle  . Tibia im nail insertion Right 07/21/2012    Procedure: INTRAMEDULLARY (IM) NAIL TIBIAL;  Surgeon: Johnny Bridge, MD;  Location: [facility];  Service: Orthopedics;  Laterality: Right;    Family History  Problem Relation Age of Onset  . Cancer Mother   . Hyperlipidemia Sister   . Hypertension Sister     Social History   Social History  . Marital Status: Married    Spouse Name: N/A  . Number of Children: 2  . Years of Education: N/A   Occupational History  . retired  At [employer]   Social History Main Topics  . Smoking status: Never Smoker   . Smokeless tobacco: Never Used  . Alcohol Use: 7.2 oz/week    12 Cans of beer per week     Comment: 2 beers several x/wk and occas cocktail on top of it.  . Drug Use: No  . Sexual Activity: Not on file   Other Topics Concern  . Not on file   Social History Narrative   No living will   Requests that wife make  decisions for him if needed   Would accept resuscitation but no prolonged artificial ventilation   Would not want tube feeds if cognitively unaware   Review of Systems Appetite is not good--weight down a few pounds Pain and swelling at tibial tuberosity on left No headaches    Objective:   Physical Exam  Constitutional: He appears well-developed and well-nourished. No distress.  Neck: Normal range of motion. Neck supple. No thyromegaly present.  Cardiovascular: Exam reveals no gallop.   No murmur heard. Irregular  Rate in 90's  Pulmonary/Chest: Effort normal and breath sounds normal. No respiratory distress. He has no wheezes. He has no rales.  Musculoskeletal: He exhibits no edema.  Lymphadenopathy:    He has no cervical adenopathy.  Psychiatric: He has a normal mood and affect. His behavior is normal.          Assessment & Plan:

## 2015-06-26 NOTE — Assessment & Plan Note (Signed)
BP Readings from Last 3 Encounters:  06/26/15 130/80  06/06/15 178/88  05/23/15 168/100   Repeat 112/70 on right  Is better, but now I wonder if he is too low at times Discussed getting out and trying to do more--will be going to the beach Trying taking losartan at night-- or splitting dose

## 2015-07-10 ENCOUNTER — Emergency Department (HOSPITAL_COMMUNITY): Payer: Medicare Other

## 2015-07-10 ENCOUNTER — Encounter (HOSPITAL_COMMUNITY): Payer: Self-pay | Admitting: *Deleted

## 2015-07-10 ENCOUNTER — Inpatient Hospital Stay (HOSPITAL_COMMUNITY)
Admission: EM | Admit: 2015-07-10 | Discharge: 2015-07-13 | DRG: 470 | Disposition: A | Discharge status: Skilled Nursing Facility | Payer: Medicare Other | Attending: Family Medicine | Admitting: Family Medicine

## 2015-07-10 DIAGNOSIS — S72001A Fracture of unspecified part of neck of right femur, initial encounter for closed fracture: Secondary | ICD-10-CM | POA: Diagnosis present

## 2015-07-10 DIAGNOSIS — Z79899 Other long term (current) drug therapy: Secondary | ICD-10-CM

## 2015-07-10 DIAGNOSIS — I482 Chronic atrial fibrillation, unspecified: Secondary | ICD-10-CM | POA: Diagnosis present

## 2015-07-10 DIAGNOSIS — I10 Essential (primary) hypertension: Secondary | ICD-10-CM | POA: Diagnosis not present

## 2015-07-10 DIAGNOSIS — M1611 Unilateral primary osteoarthritis, right hip: Secondary | ICD-10-CM | POA: Diagnosis present

## 2015-07-10 DIAGNOSIS — Z7901 Long term (current) use of anticoagulants: Secondary | ICD-10-CM | POA: Diagnosis not present

## 2015-07-10 DIAGNOSIS — I119 Hypertensive heart disease without heart failure: Secondary | ICD-10-CM | POA: Diagnosis present

## 2015-07-10 DIAGNOSIS — E785 Hyperlipidemia, unspecified: Secondary | ICD-10-CM | POA: Diagnosis present

## 2015-07-10 DIAGNOSIS — S72141A Displaced intertrochanteric fracture of right femur, initial encounter for closed fracture: Secondary | ICD-10-CM | POA: Diagnosis present

## 2015-07-10 DIAGNOSIS — Z96642 Presence of left artificial hip joint: Secondary | ICD-10-CM | POA: Diagnosis present

## 2015-07-10 DIAGNOSIS — D649 Anemia, unspecified: Secondary | ICD-10-CM | POA: Diagnosis present

## 2015-07-10 DIAGNOSIS — W1830XA Fall on same level, unspecified, initial encounter: Secondary | ICD-10-CM | POA: Diagnosis present

## 2015-07-10 DIAGNOSIS — Z8781 Personal history of (healed) traumatic fracture: Secondary | ICD-10-CM

## 2015-07-10 DIAGNOSIS — D62 Acute posthemorrhagic anemia: Secondary | ICD-10-CM | POA: Diagnosis not present

## 2015-07-10 DIAGNOSIS — Z8249 Family history of ischemic heart disease and other diseases of the circulatory system: Secondary | ICD-10-CM | POA: Diagnosis not present

## 2015-07-10 DIAGNOSIS — Z9889 Other specified postprocedural states: Secondary | ICD-10-CM

## 2015-07-10 HISTORY — DX: Fracture of unspecified part of neck of right femur, initial encounter for closed fracture: S72.001A

## 2015-07-10 LAB — BASIC METABOLIC PANEL
Anion gap: 11 (ref 5–15)
BUN: 15 mg/dL (ref 6–20)
CHLORIDE: 104 mmol/L (ref 101–111)
CO2: 22 mmol/L (ref 22–32)
Calcium: 9 mg/dL (ref 8.9–10.3)
Creatinine, Ser: 1.02 mg/dL (ref 0.61–1.24)
GFR calc Af Amer: >60 mL/min (ref >60)
GLUCOSE: 103 mg/dL — AB (ref 65–99)
POTASSIUM: 3.9 mmol/L (ref 3.5–5.1)
Sodium: 137 mmol/L (ref 135–145)

## 2015-07-10 LAB — CBC WITH DIFFERENTIAL/PLATELET
Basophils Absolute: 0.1 10*3/uL (ref 0.0–0.1)
Basophils Relative: 1 %
EOS PCT: 3 %
Eosinophils Absolute: 0.4 10*3/uL (ref 0.0–0.7)
HCT: 37 % — ABNORMAL LOW (ref 39.0–52.0)
Hemoglobin: 12.6 g/dL — ABNORMAL LOW (ref 13.0–17.0)
LYMPHS ABS: 1.2 10*3/uL (ref 0.7–4.0)
LYMPHS PCT: 10 %
MCH: 32.1 pg (ref 26.0–34.0)
MCHC: 34.1 g/dL (ref 30.0–36.0)
MCV: 94.4 fL (ref 78.0–100.0)
MONO ABS: 1.3 10*3/uL — AB (ref 0.1–1.0)
MONOS PCT: 11 %
Neutro Abs: 8.5 10*3/uL — ABNORMAL HIGH (ref 1.7–7.7)
Neutrophils Relative %: 75 %
PLATELETS: 215 10*3/uL (ref 150–400)
RBC: 3.92 MIL/uL — ABNORMAL LOW (ref 4.22–5.81)
RDW: 13.9 % (ref 11.5–15.5)
WBC: 11.4 10*3/uL — ABNORMAL HIGH (ref 4.0–10.5)

## 2015-07-10 MED ORDER — FENTANYL CITRATE (PF) 100 MCG/2ML IJ SOLN
25.0000 ug | Freq: Once | INTRAMUSCULAR | Status: AC
  Administered 2015-07-10: 25 ug via INTRAVENOUS
  Filled 2015-07-10: qty 2

## 2015-07-10 NOTE — ED Notes (Signed)
Right hip pain making it impossible to walk since fall out of chair yesterday pm

## 2015-07-10 NOTE — ED Provider Notes (Addendum)
CSN: [id number]     Arrival date & time 07/10/15  1539 History   First MD Initiated Contact with Patient 07/10/15 2029     Chief Complaint  Patient presents with  . Hip Pain     Patient is a 82 y.o. male presenting with hip pain.  Hip Pain Pertinent negatives include no chest pain, no abdominal pain, no headaches and no shortness of breath.  Patient presents after a fall last night. States he lost balance trying to sit on one of the stairs. States he was at [employer]. Landed on his right hip area. Since then has had pain in his had the inability to walk. His had previous left hip replacement but this is on the right side. No other injury. No headache. Neck pain chest or abdominal pain. He is on Pradaxa for atrial fibrillation. He states the pain radiates down the right leg also.  Past Medical History  Diagnosis Date  . Hyperlipidemia   . Hypertensive heart disease   . Lumbar disc disease   . Osteoarthritis   . Chronic atrial fibrillation ([place])     a. 07/2012 Echo: EF 55-60%, no rwma, mildly dil RA/LA.  Marland Kitchen Syncope     a. 07/2012 - unclear etiology-->resulted in right tib/fib fx req ORIF/intramedullary nail of tibia.   Past Surgical History  Procedure Laterality Date  . Total hip arthroplasty      left  . Ankle surgery      right ankle  . Tibia im nail insertion Right 07/21/2012    Procedure: INTRAMEDULLARY (IM) NAIL TIBIAL;  Surgeon: Johnny Bridge, MD;  Location: [facility];  Service: Orthopedics;  Laterality: Right;   Family History  Problem Relation Age of Onset  . Cancer Mother   . Hyperlipidemia Sister   . Hypertension Sister    Social History  Substance Use Topics  . Smoking status: Never Smoker   . Smokeless tobacco: Never Used  . Alcohol Use: 7.2 oz/week    12 Cans of beer per week     Comment: 2 beers several x/wk and occas cocktail on top of it.    Review of Systems  Constitutional: Negative for activity change and appetite change.  Eyes: Negative for pain.   Respiratory: Negative for chest tightness and shortness of breath.   Cardiovascular: Negative for chest pain and leg swelling.  Gastrointestinal: Negative for nausea, vomiting, abdominal pain and diarrhea.  Genitourinary: Negative for flank pain.  Musculoskeletal: Negative for back pain and neck stiffness.  Skin: Negative for rash.  Neurological: Negative for weakness, numbness and headaches.  Psychiatric/Behavioral: Negative for behavioral problems.      Allergies  Review of patient's allergies indicates no known allergies.  Home Medications   Prior to Admission medications   Medication Sig Start Date End Date Taking? Authorizing Provider  losartan (COZAAR) 100 MG tablet Take 50 mg by mouth 2 (two) times daily.  06/18/15  Yes Historical Provider, MD  naproxen sodium (ANAPROX) 220 MG tablet Take 220 mg by mouth 2 (two) times daily with a meal.   Yes Historical Provider, MD  PRADAXA 110 MG CAPS Take 110 mg by mouth 2 (two) times daily. 06/18/15  Yes Historical Provider, MD   BP 168/123 mmHg  Pulse 104  Temp(Src) 98.1 F (36.7 C) (Oral)  Resp 16  Wt 159 lb (72.122 kg)  SpO2 98% Physical Exam  Constitutional: He appears well-developed.  HENT:  Head: Atraumatic.  Cardiovascular: Normal rate.   Pulmonary/Chest: Effort normal.  Abdominal: Soft.  Musculoskeletal: He exhibits tenderness.  No tenderness on right. Left lower extremity. No lumbar tenderness. There is tenderness over the right hip near the groin. Also some tenderness on the mid thigh on the right side. No clear deformity. No rash intact bilateral feet. Good range of motion left hip. Good range of motion left knee. Overall somewhat decreased range of motion right hip.  Skin: Skin is warm. No erythema.    ED Course  Procedures (including critical care time) Labs Review Labs Reviewed  BASIC METABOLIC PANEL - Abnormal; Notable for the following:    Glucose, Bld 103 (*)    All other components within normal limits   CBC WITH DIFFERENTIAL/PLATELET - Abnormal; Notable for the following:    WBC 11.4 (*)    RBC 3.92 (*)    Hemoglobin 12.6 (*)    HCT 37.0 (*)    Neutro Abs 8.5 (*)    Monocytes Absolute 1.3 (*)    All other components within normal limits  PROTIME-INR  TYPE AND SCREEN    Imaging Review Ct Hip Right Wo Contrast  07/10/2015  CLINICAL DATA:  Right hip pain and unable to bear weight after a fall yesterday. EXAM: CT OF THE RIGHT HIP WITHOUT CONTRAST TECHNIQUE: Multidetector CT imaging of the right hip was performed according to the standard protocol. Multiplanar CT image reconstructions were also generated. COMPARISON:  Right hip radiographs 07/10/2015 FINDINGS: Degenerative changes in the right hip with complete loss of joint space, sclerosis, degenerative cysts on both sides of the joint, and prominent osteophytes on both sides of the joint. Inter trochanteric linear lucency at the base of the femoral neck, extending to the greater trochanter consistent with comminuted and nondisplaced acute fracture. Lesser trochanter does not appear to be involved. No significant hematoma or soft tissue swelling. Visualized pelvic structures appear intact. IMPRESSION: Nondisplaced acute fracture of the base of the femoral neck at the inter trochanteric region with extension to the greater trochanter. Severe degenerative changes in the right hip. Electronically Signed   By: Lucienne Capers M.D.   On: 07/10/2015 23:03   Dg Chest Port 1 View  07/10/2015  CLINICAL DATA:  Preop hip surgery. EXAM: PORTABLE CHEST 1 VIEW COMPARISON:  05/12/2013 FINDINGS: The cardiomediastinal contours are unchanged, heart at the upper limits of normal in size. The lungs are clear. Pulmonary vasculature is normal. No consolidation, pleural effusion, or pneumothorax. No acute osseous abnormalities are seen. IMPRESSION: No acute process. Electronically Signed   By: Jeb Levering M.D.   On: 07/10/2015 23:33   Dg Hip Unilat  With Pelvis  2-3 Views Right  07/10/2015  CLINICAL DATA:  Lateral RIGHT hip pain after falling last night [place], unable to bear weight on RIGHT leg, limited range of motion RIGHT hip EXAM: DG HIP (WITH OR WITHOUT PELVIS) 2-3V RIGHT COMPARISON:  02/01/2008 FINDINGS: Diffuse osseous demineralization. Components of a LEFT hip prosthesis identified. Marked narrowing of RIGHT hip joint with minimal subchondral sclerosis, marginal spurs, bone on bone appearance and question subchondral cyst formation at femoral head. No definite acute fracture, dislocation or bone destruction. IMPRESSION: Progressive osteoarthritic changes RIGHT hip since 2009. Osseous demineralization. No definite acute bony abnormalities identified; if patient has persistent pain, may consider followup MR imaging. Electronically Signed   By: Lavonia Dana M.D.   On: 07/10/2015 16:36   Dg Femur, Min 2 Views Right  07/10/2015  CLINICAL DATA:  Right hip pain since a fall yesterday. EXAM: RIGHT  FEMUR 2 VIEWS COMPARISON:  None. FINDINGS: Diffuse bone demineralization. Severe degenerative changes in the right hip with near complete narrowing of the acetabular joint space, bone sclerosis and subcortical cysts, and prominent osteophyte formation. No evidence of acute fracture or dislocation in the right femur. Mild degenerative changes in the right knee. No effusion. Vascular calcifications. Postoperative changes in the proximal tibia. IMPRESSION: Degenerative changes in the right hip.  No acute bony abnormalities. Electronically Signed   By: Lucienne Capers M.D.   On: 07/10/2015 21:48   I have personally reviewed and evaluated these images and lab results as part of my medical decision-making.   EKG Interpretation   Date/Time:  Wednesday July 11 2015 00:17:07 EDT Ventricular Rate:  111 PR Interval:  41 QRS Duration: 108 QT Interval:  326 QTC Calculation: 443 R Axis:   37 Text Interpretation:  Atrial fibrillation Ventricular  premature complex  Abnormal R-wave progression, early transition Confirmed by Alvino Chapel  MD,  Ka Flammer [phone]) on 07/11/2015 12:35:41 AM      MDM   Final diagnoses:  Hip fracture requiring operative repair, right, closed, initial encounter ([place])  Chronic atrial fibrillation ([place])    Patient with fall and hip fracture. Initial negative did not show fracture but CT done. Discussed with Dr. Noemi Chapel and will see the patient tomorrow. Will keep patient nothing by mouth at midnight in case it wants to get done tomorrow, however with    the patient's anticoagulation it may be delayed.   Davonna Belling, MD 07/10/15 2351  Davonna Belling, MD 07/11/15 [phone]

## 2015-07-11 ENCOUNTER — Inpatient Hospital Stay (HOSPITAL_COMMUNITY): Payer: Medicare Other | Admitting: Anesthesiology

## 2015-07-11 ENCOUNTER — Encounter (HOSPITAL_COMMUNITY): Payer: Self-pay | Admitting: Family Medicine

## 2015-07-11 ENCOUNTER — Encounter (HOSPITAL_COMMUNITY)
Admission: EM | Disposition: A | Discharge status: Skilled Nursing Facility | Payer: Self-pay | Source: Home / Self Care | Attending: Family Medicine

## 2015-07-11 ENCOUNTER — Inpatient Hospital Stay (HOSPITAL_COMMUNITY): Payer: Medicare Other

## 2015-07-11 DIAGNOSIS — D649 Anemia, unspecified: Secondary | ICD-10-CM

## 2015-07-11 DIAGNOSIS — S72001A Fracture of unspecified part of neck of right femur, initial encounter for closed fracture: Secondary | ICD-10-CM

## 2015-07-11 HISTORY — PX: TOTAL HIP ARTHROPLASTY: SHX124

## 2015-07-11 LAB — PROTIME-INR
INR: 1.34 (ref 0.00–1.49)
PROTHROMBIN TIME: 16.7 s — AB (ref 11.6–15.2)

## 2015-07-11 LAB — URINALYSIS, ROUTINE W REFLEX MICROSCOPIC
BILIRUBIN URINE: NEGATIVE
Glucose, UA: NEGATIVE mg/dL
HGB URINE DIPSTICK: NEGATIVE
KETONES UR: 15 mg/dL — AB
Leukocytes, UA: NEGATIVE
Nitrite: NEGATIVE
PROTEIN: NEGATIVE mg/dL
Specific Gravity, Urine: 1.02 (ref 1.005–1.030)
pH: 5.5 (ref 5.0–8.0)

## 2015-07-11 LAB — TYPE AND SCREEN
ABO/RH(D): A POS
Antibody Screen: NEGATIVE

## 2015-07-11 LAB — SURGICAL PCR SCREEN
MRSA, PCR: NEGATIVE
STAPHYLOCOCCUS AUREUS: NEGATIVE

## 2015-07-11 SURGERY — ARTHROPLASTY, HIP, TOTAL,POSTERIOR APPROACH
Anesthesia: General | Site: Hip | Laterality: Right

## 2015-07-11 MED ORDER — HYDROCODONE-ACETAMINOPHEN 5-325 MG PO TABS: 1.0000 | ORAL_TABLET | Freq: Four times a day (QID) | ORAL | Status: DC | PRN

## 2015-07-11 MED ORDER — POVIDONE-IODINE 10 % EX SWAB
2.0000 "application " | Freq: Once | CUTANEOUS | Status: AC
  Administered 2015-07-11: 2 via TOPICAL

## 2015-07-11 MED ORDER — HYDROMORPHONE HCL 1 MG/ML IJ SOLN
0.5000 mg | INTRAMUSCULAR | Status: DC | PRN
  Administered 2015-07-11: 0.5 mg via INTRAVENOUS

## 2015-07-11 MED ORDER — MAGNESIUM CITRATE PO SOLN: 1.0000 | Freq: Once | ORAL | Status: DC | PRN

## 2015-07-11 MED ORDER — CALCIUM CHLORIDE 10 % IV SOLN
INTRAVENOUS | Status: AC
  Filled 2015-07-11: qty 10

## 2015-07-11 MED ORDER — ACETAMINOPHEN 325 MG PO TABS: 650.0000 mg | ORAL_TABLET | Freq: Four times a day (QID) | ORAL | Status: DC | PRN

## 2015-07-11 MED ORDER — BISACODYL 10 MG RE SUPP: 10.0000 mg | Freq: Every day | RECTAL | Status: DC | PRN

## 2015-07-11 MED ORDER — MENTHOL 3 MG MT LOZG: 1.0000 | LOZENGE | OROMUCOSAL | Status: DC | PRN

## 2015-07-11 MED ORDER — ONDANSETRON HCL 4 MG/2ML IJ SOLN
INTRAMUSCULAR | Status: DC | PRN
  Administered 2015-07-11: 4 mg via INTRAVENOUS

## 2015-07-11 MED ORDER — HYDRALAZINE HCL 20 MG/ML IJ SOLN: 10.0000 mg | INTRAMUSCULAR | Status: DC | PRN

## 2015-07-11 MED ORDER — MORPHINE SULFATE (PF) 2 MG/ML IV SOLN
0.5000 mg | INTRAVENOUS | Status: DC | PRN
  Administered 2015-07-11: 0.5 mg via INTRAVENOUS
  Filled 2015-07-11: qty 1

## 2015-07-11 MED ORDER — FENTANYL CITRATE (PF) 100 MCG/2ML IJ SOLN
INTRAMUSCULAR | Status: DC | PRN
  Administered 2015-07-11: 25 ug via INTRAVENOUS
  Administered 2015-07-11 (×2): 50 ug via INTRAVENOUS
  Administered 2015-07-11: 25 ug via INTRAVENOUS

## 2015-07-11 MED ORDER — LIDOCAINE HCL (CARDIAC) 20 MG/ML IV SOLN
INTRAVENOUS | Status: DC | PRN
  Administered 2015-07-11: 100 mg via INTRATRACHEAL
  Administered 2015-07-11: 100 mg via INTRAVENOUS

## 2015-07-11 MED ORDER — PHENYLEPHRINE HCL 10 MG/ML IJ SOLN
INTRAMUSCULAR | Status: AC
  Filled 2015-07-11: qty 1

## 2015-07-11 MED ORDER — BUPIVACAINE HCL (PF) 0.25 % IJ SOLN
INTRAMUSCULAR | Status: DC | PRN
  Administered 2015-07-11: 20 mL

## 2015-07-11 MED ORDER — LACTATED RINGERS IV SOLN
INTRAVENOUS | Status: DC | PRN
  Administered 2015-07-11 (×3): via INTRAVENOUS

## 2015-07-11 MED ORDER — SUGAMMADEX SODIUM 200 MG/2ML IV SOLN
INTRAVENOUS | Status: DC | PRN
  Administered 2015-07-11: 200 mg via INTRAVENOUS

## 2015-07-11 MED ORDER — BACITRACIN-NEOMYCIN-POLYMYXIN OINTMENT TUBE
TOPICAL_OINTMENT | CUTANEOUS | Status: DC | PRN
  Administered 2015-07-11: 1 via TOPICAL

## 2015-07-11 MED ORDER — LACTATED RINGERS IV SOLN
Freq: Once | INTRAVENOUS | Status: DC
  Administered 2015-07-11: 15:00:00 via INTRAVENOUS

## 2015-07-11 MED ORDER — DOCUSATE SODIUM 100 MG PO CAPS
100.0000 mg | ORAL_CAPSULE | Freq: Two times a day (BID) | ORAL | Status: DC
  Administered 2015-07-11 – 2015-07-13 (×4): 100 mg via ORAL
  Filled 2015-07-11 (×4): qty 1

## 2015-07-11 MED ORDER — DABIGATRAN ETEXILATE MESYLATE 150 MG PO CAPS
150.0000 mg | ORAL_CAPSULE | Freq: Two times a day (BID) | ORAL | Status: DC
  Administered 2015-07-11 – 2015-07-13 (×4): 150 mg via ORAL
  Filled 2015-07-11 (×4): qty 1

## 2015-07-11 MED ORDER — ONDANSETRON HCL 4 MG PO TABS: 4.0000 mg | ORAL_TABLET | Freq: Four times a day (QID) | ORAL | Status: DC | PRN

## 2015-07-11 MED ORDER — SUCCINYLCHOLINE CHLORIDE 20 MG/ML IJ SOLN
INTRAMUSCULAR | Status: DC | PRN
  Administered 2015-07-11: 120 mg via INTRAVENOUS

## 2015-07-11 MED ORDER — PROPOFOL 10 MG/ML IV BOLUS
INTRAVENOUS | Status: DC | PRN
  Administered 2015-07-11: 150 mg via INTRAVENOUS

## 2015-07-11 MED ORDER — SUGAMMADEX SODIUM 200 MG/2ML IV SOLN
INTRAVENOUS | Status: AC
  Filled 2015-07-11: qty 2

## 2015-07-11 MED ORDER — ROCURONIUM BROMIDE 50 MG/5ML IV SOLN
INTRAVENOUS | Status: AC
  Filled 2015-07-11: qty 1

## 2015-07-11 MED ORDER — HYDROCODONE-ACETAMINOPHEN 5-325 MG PO TABS
1.0000 | ORAL_TABLET | Freq: Four times a day (QID) | ORAL | Status: DC | PRN
  Administered 2015-07-11 – 2015-07-12 (×4): 2 via ORAL
  Administered 2015-07-13: 1 via ORAL
  Filled 2015-07-11 (×5): qty 2

## 2015-07-11 MED ORDER — POLYETHYLENE GLYCOL 3350 17 G PO PACK
17.0000 g | PACK | Freq: Every day | ORAL | Status: DC | PRN
  Administered 2015-07-13: 17 g via ORAL
  Filled 2015-07-11: qty 1

## 2015-07-11 MED ORDER — HEPARIN SODIUM (PORCINE) 5000 UNIT/ML IJ SOLN
5000.0000 [IU] | Freq: Three times a day (TID) | INTRAMUSCULAR | Status: DC
  Administered 2015-07-11 (×2): 5000 [IU] via SUBCUTANEOUS
  Filled 2015-07-11 (×2): qty 1

## 2015-07-11 MED ORDER — ACETAMINOPHEN 650 MG RE SUPP: 650.0000 mg | Freq: Four times a day (QID) | RECTAL | Status: DC | PRN

## 2015-07-11 MED ORDER — ONDANSETRON HCL 4 MG/2ML IJ SOLN: 4.0000 mg | Freq: Four times a day (QID) | INTRAMUSCULAR | Status: DC | PRN

## 2015-07-11 MED ORDER — HYDROCODONE-ACETAMINOPHEN 5-325 MG PO TABS
1.0000 | ORAL_TABLET | Freq: Four times a day (QID) | ORAL | Status: DC | PRN
  Administered 2015-07-11: 2 via ORAL
  Filled 2015-07-11: qty 2

## 2015-07-11 MED ORDER — CEFAZOLIN SODIUM-DEXTROSE 2-4 GM/100ML-% IV SOLN
2.0000 g | Freq: Four times a day (QID) | INTRAVENOUS | Status: AC
  Administered 2015-07-11 – 2015-07-12 (×2): 2 g via INTRAVENOUS
  Filled 2015-07-11 (×2): qty 100

## 2015-07-11 MED ORDER — HYDROMORPHONE HCL 1 MG/ML IJ SOLN
INTRAMUSCULAR | Status: AC
  Administered 2015-07-11: 0.5 mg via INTRAVENOUS
  Filled 2015-07-11: qty 1

## 2015-07-11 MED ORDER — ALBUMIN HUMAN 5 % IV SOLN
INTRAVENOUS | Status: DC | PRN
  Administered 2015-07-11: 16:00:00 via INTRAVENOUS

## 2015-07-11 MED ORDER — PHENOL 1.4 % MT LIQD: 1.0000 | OROMUCOSAL | Status: DC | PRN

## 2015-07-11 MED ORDER — SENNA 8.6 MG PO TABS
1.0000 | ORAL_TABLET | Freq: Two times a day (BID) | ORAL | Status: DC
  Administered 2015-07-11 – 2015-07-13 (×4): 8.6 mg via ORAL
  Filled 2015-07-11 (×4): qty 1

## 2015-07-11 MED ORDER — FENTANYL CITRATE (PF) 250 MCG/5ML IJ SOLN
INTRAMUSCULAR | Status: AC
  Filled 2015-07-11: qty 5

## 2015-07-11 MED ORDER — BACITRACIN-NEOMYCIN-POLYMYXIN 400-5-5000 EX OINT
TOPICAL_OINTMENT | CUTANEOUS | Status: AC
  Filled 2015-07-11: qty 1

## 2015-07-11 MED ORDER — PHENYLEPHRINE HCL 10 MG/ML IJ SOLN
10.0000 mg | INTRAVENOUS | Status: DC | PRN
  Administered 2015-07-11: 50 ug/min via INTRAVENOUS

## 2015-07-11 MED ORDER — SENNA-DOCUSATE SODIUM 8.6-50 MG PO TABS: 2.0000 | ORAL_TABLET | Freq: Every day | ORAL | Status: DC

## 2015-07-11 MED ORDER — ALUM & MAG HYDROXIDE-SIMETH 200-200-20 MG/5ML PO SUSP: 30.0000 mL | ORAL | Status: DC | PRN

## 2015-07-11 MED ORDER — NEOSTIGMINE METHYLSULFATE 10 MG/10ML IV SOLN
INTRAVENOUS | Status: AC
  Filled 2015-07-11: qty 1

## 2015-07-11 MED ORDER — 0.9 % SODIUM CHLORIDE (POUR BTL) OPTIME
TOPICAL | Status: DC | PRN
  Administered 2015-07-11: 1000 mL

## 2015-07-11 MED ORDER — BUPIVACAINE HCL (PF) 0.25 % IJ SOLN
INTRAMUSCULAR | Status: AC
  Filled 2015-07-11: qty 30

## 2015-07-11 MED ORDER — MORPHINE SULFATE (PF) 2 MG/ML IV SOLN: 2.0000 mg | INTRAVENOUS | Status: DC | PRN

## 2015-07-11 MED ORDER — PHENYLEPHRINE HCL 10 MG/ML IJ SOLN
INTRAMUSCULAR | Status: DC | PRN
  Administered 2015-07-11 (×2): 80 ug via INTRAVENOUS
  Administered 2015-07-11 (×2): 120 ug via INTRAVENOUS

## 2015-07-11 MED ORDER — ONDANSETRON HCL 4 MG/2ML IJ SOLN: 4.0000 mg | Freq: Once | INTRAMUSCULAR | Status: DC | PRN

## 2015-07-11 MED ORDER — CEFAZOLIN SODIUM-DEXTROSE 2-4 GM/100ML-% IV SOLN
2.0000 g | INTRAVENOUS | Status: DC
Start: 2015-07-12 — End: 2015-07-11
  Administered 2015-07-11: 2 g via INTRAVENOUS
  Filled 2015-07-11: qty 100

## 2015-07-11 MED ORDER — FERROUS SULFATE 325 (65 FE) MG PO TABS
325.0000 mg | ORAL_TABLET | Freq: Three times a day (TID) | ORAL | Status: DC
  Administered 2015-07-11 – 2015-07-13 (×6): 325 mg via ORAL
  Filled 2015-07-11 (×6): qty 1

## 2015-07-11 MED ORDER — ROCURONIUM BROMIDE 100 MG/10ML IV SOLN
INTRAVENOUS | Status: DC | PRN
  Administered 2015-07-11: 40 mg via INTRAVENOUS
  Administered 2015-07-11 (×2): 10 mg via INTRAVENOUS

## 2015-07-11 MED ORDER — POLYETHYLENE GLYCOL 3350 17 G PO PACK: 17.0000 g | PACK | Freq: Every day | ORAL | Status: DC | PRN

## 2015-07-11 MED ORDER — CALCIUM CHLORIDE 10 % IV SOLN
INTRAVENOUS | Status: DC | PRN
  Administered 2015-07-11 (×4): 100 mg via INTRAVENOUS

## 2015-07-11 MED ORDER — SODIUM CHLORIDE 0.9 % IV SOLN
75.0000 mL/h | INTRAVENOUS | Status: DC
  Administered 2015-07-11: 75 mL/h via INTRAVENOUS

## 2015-07-11 MED ORDER — BACLOFEN 10 MG PO TABS: 10.0000 mg | ORAL_TABLET | Freq: Three times a day (TID) | ORAL | Status: DC

## 2015-07-11 MED ORDER — CHLORHEXIDINE GLUCONATE 4 % EX LIQD: 60.0000 mL | Freq: Once | CUTANEOUS | Status: DC

## 2015-07-11 SURGICAL SUPPLY — 63 items
BIT DRILL 5/64X5 DISP (BIT) ×3 IMPLANT
BLADE SAW SAG 73X25 THK (BLADE) ×2
BLADE SAW SGTL 73X25 THK (BLADE) ×1 IMPLANT
BRUSH FEMORAL CANAL (MISCELLANEOUS) IMPLANT
CAPT HIP TOTAL 2 ×2 IMPLANT
CLOSURE STERI-STRIP 1/2X4 (GAUZE/BANDAGES/DRESSINGS) ×1
CLSR STERI-STRIP ANTIMIC 1/2X4 (GAUZE/BANDAGES/DRESSINGS) ×3 IMPLANT
COVER SURGICAL LIGHT HANDLE (MISCELLANEOUS) ×3 IMPLANT
DRAPE INCISE IOBAN 66X45 STRL (DRAPES) IMPLANT
DRAPE ORTHO SPLIT 77X108 STRL (DRAPES) ×6
DRAPE PROXIMA HALF (DRAPES) ×3 IMPLANT
DRAPE SURG ORHT 6 SPLT 77X108 (DRAPES) ×2 IMPLANT
DRAPE U-SHAPE 47X51 STRL (DRAPES) ×3 IMPLANT
DRSG MEPILEX BORDER 4X12 (GAUZE/BANDAGES/DRESSINGS) IMPLANT
DRSG MEPILEX BORDER 4X8 (GAUZE/BANDAGES/DRESSINGS) ×2 IMPLANT
DURAPREP 26ML APPLICATOR (WOUND CARE) ×5 IMPLANT
ELECT CAUTERY BLADE 6.4 (BLADE) ×3 IMPLANT
ELECT REM PT RETURN 9FT ADLT (ELECTROSURGICAL) ×3
ELECTRODE REM PT RTRN 9FT ADLT (ELECTROSURGICAL) ×1 IMPLANT
GLOVE BIOGEL PI IND STRL 8 (GLOVE) ×1 IMPLANT
GLOVE BIOGEL PI INDICATOR 8 (GLOVE) ×2
GLOVE BIOGEL PI ORTHO PRO SZ8 (GLOVE) ×2
GLOVE ORTHO TXT STRL SZ7.5 (GLOVE) ×3 IMPLANT
GLOVE PI ORTHO PRO STRL SZ8 (GLOVE) ×1 IMPLANT
GLOVE SURG ORTHO 8.0 STRL STRW (GLOVE) ×3 IMPLANT
GOWN STRL REUS W/ TWL XL LVL3 (GOWN DISPOSABLE) ×1 IMPLANT
GOWN STRL REUS W/TWL 2XL LVL3 (GOWN DISPOSABLE) ×3 IMPLANT
GOWN STRL REUS W/TWL XL LVL3 (GOWN DISPOSABLE) ×3
HANDPIECE INTERPULSE COAX TIP (DISPOSABLE)
HOOD PEEL AWAY FACE SHEILD DIS (HOOD) ×6 IMPLANT
KIT BASIN OR (CUSTOM PROCEDURE TRAY) ×3 IMPLANT
KIT ROOM TURNOVER OR (KITS) ×3 IMPLANT
MANIFOLD NEPTUNE II (INSTRUMENTS) ×3 IMPLANT
NDL 18GX1X1/2 (RX/OR ONLY) (NEEDLE) IMPLANT
NDL SAFETY ECLIPSE 18X1.5 (NEEDLE) ×1 IMPLANT
NDL SUT .5 MAYO 1.404X.05X (NEEDLE) ×1 IMPLANT
NEEDLE 18GX1X1/2 (RX/OR ONLY) (NEEDLE) ×3 IMPLANT
NEEDLE HYPO 18GX1.5 SHARP (NEEDLE) ×3
NEEDLE MAYO TAPER (NEEDLE) ×3
NS IRRIG 1000ML POUR BTL (IV SOLUTION) ×3 IMPLANT
PACK TOTAL JOINT (CUSTOM PROCEDURE TRAY) ×3 IMPLANT
PAD ARMBOARD 7.5X6 YLW CONV (MISCELLANEOUS) ×6 IMPLANT
PILLOW ABDUCTION HIP (SOFTGOODS) ×3 IMPLANT
PRESSURIZER FEMORAL UNIV (MISCELLANEOUS) IMPLANT
RETRIEVER SUT HEWSON (MISCELLANEOUS) ×3 IMPLANT
SET HNDPC FAN SPRY TIP SCT (DISPOSABLE) IMPLANT
SPONGE LAP 4X18 X RAY DECT (DISPOSABLE) IMPLANT
SUCTION FRAZIER HANDLE 10FR (MISCELLANEOUS) ×2
SUCTION TUBE FRAZIER 10FR DISP (MISCELLANEOUS) ×1 IMPLANT
SUT FIBERWIRE #2 38 REV NDL BL (SUTURE) ×9
SUT MNCRL AB 4-0 PS2 18 (SUTURE) ×3 IMPLANT
SUT VIC AB 0 CT1 27 (SUTURE) ×3
SUT VIC AB 0 CT1 27XBRD ANBCTR (SUTURE) ×1 IMPLANT
SUT VIC AB 2-0 CT1 27 (SUTURE) ×3
SUT VIC AB 2-0 CT1 TAPERPNT 27 (SUTURE) ×1 IMPLANT
SUT VIC AB 3-0 SH 8-18 (SUTURE) ×3 IMPLANT
SUTURE FIBERWR#2 38 REV NDL BL (SUTURE) ×3 IMPLANT
SYR CONTROL 10ML LL (SYRINGE) ×3 IMPLANT
TOWEL OR 17X24 6PK STRL BLUE (TOWEL DISPOSABLE) ×3 IMPLANT
TOWEL OR 17X26 10 PK STRL BLUE (TOWEL DISPOSABLE) ×3 IMPLANT
TOWER CARTRIDGE SMART MIX (DISPOSABLE) IMPLANT
TRAY FOLEY CATH 14FR (SET/KITS/TRAYS/PACK) IMPLANT
WATER STERILE IRR 1000ML POUR (IV SOLUTION) ×2 IMPLANT

## 2015-07-11 NOTE — Transfer of Care (Signed)
Immediate Anesthesia Transfer of Care Note  Patient: Martin Conner  Procedure(s) Performed: Procedure(s): TOTAL HIP ARTHROPLASTY (Right)  Patient Location: PACU  Anesthesia Type:General  Level of Consciousness: awake and alert   Airway & Oxygen Therapy: Patient Spontanous Breathing and Patient connected to face mask oxygen  Post-op Assessment: Report given to RN, Post -op Vital signs reviewed and stable and Patient moving all extremities X 4  Post vital signs: Reviewed and stable  Last Vitals:  Filed Vitals:   07/11/15 0814 07/11/15 1417  BP:  131/68  Pulse:  90  Temp: 37.1 C   Resp:      Complications: No apparent anesthesia complications

## 2015-07-11 NOTE — Anesthesia Postprocedure Evaluation (Signed)
Anesthesia Post Note  Patient: Martin Conner  Procedure(s) Performed: Procedure(s) (LRB): TOTAL HIP ARTHROPLASTY (Right)  Patient location during evaluation: PACU Anesthesia Type: General Level of consciousness: awake, sedated and patient cooperative Pain management: pain level controlled Vital Signs Assessment: post-procedure vital signs reviewed and stable Respiratory status: spontaneous breathing Cardiovascular status: blood pressure returned to baseline and stable Anesthetic complications: no    Last Vitals:  Filed Vitals:   07/11/15 1830 07/11/15 1840  BP: 128/64   Pulse: 92 86  Temp:    Resp: 16 14    Last Pain:  Filed Vitals:   07/11/15 1845  PainSc: 8                  Quinne Pires EDWARD

## 2015-07-11 NOTE — Anesthesia Procedure Notes (Signed)
Procedure Name: Intubation Date/Time: 07/11/2015 3:26 PM Performed by: Jenne Campus Pre-anesthesia Checklist: Patient identified, Emergency Drugs available, Suction available, Patient being monitored and Timeout performed Patient Re-evaluated:Patient Re-evaluated prior to inductionOxygen Delivery Method: Circle system utilized Preoxygenation: Pre-oxygenation with 100% oxygen Intubation Type: IV induction Ventilation: Mask ventilation without difficulty Laryngoscope Size: Miller and 2 Grade View: Grade I Tube type: Oral Tube size: 7.5 mm Number of attempts: 1 Airway Equipment and Method: Stylet Placement Confirmation: ETT inserted through vocal cords under direct vision,  positive ETCO2,  CO2 detector and breath sounds checked- equal and bilateral Secured at: 22 cm Tube secured with: Tape Dental Injury: Teeth and Oropharynx as per pre-operative assessment

## 2015-07-11 NOTE — ED Notes (Signed)
Pt reports unable to void fully since noon Tuesday.  This RN bladder scanned pt, over 651mL found.  Admitting MD Dr. Darrick Meigs made aware.

## 2015-07-11 NOTE — ED Notes (Signed)
MD at bedside. 

## 2015-07-11 NOTE — Progress Notes (Signed)
Right hip fracture with arthritis.    Seen by my assistant, patient well known to me.    Plan for right total hip replacement, I have already talked to him about this in the past in the office before his break.    Surgery probable later this afternoon if ok with medical team.    Full consult to follow.    Johnny Bridge, MD

## 2015-07-11 NOTE — Progress Notes (Signed)
Subjective: Patient admitted this morning, see detailed H&P by Dr Myna Hidalgo this morning. 82 y.o. male with medical history significant for chronic atrial fibrillation on prior DEXA, hypertension, and hyperlipidemia who presents to the ED with severe right hip pain and inability to ambulate following a mechanical ground-level fall the evening prior.Nondisplaced acute fracture at the base of the right femoral neck was noted on CT of the hip and orthopedic surgery was consulted by the EDP.  Filed Vitals:   07/11/15 0814 07/11/15 1417  BP:  131/68  Pulse:  90  Temp: 98.7 F (37.1 C)   Resp:      Chest: Clear Bilaterally Heart : S1S2 RRR Abdomen: Soft, nontender Ext : No edema Neuro: Alert, oriented x 3  A/P Right femoral neck fracture Orthopedic surgery has been consulted. Continue morphine for pain control.  Chronic atrial fibrillation Patient on Pradaxa, which is currently on hold in anticipation for surgery. Chads vasc score is 3  Hypertension Losartan is on hold Continue hydralazine when necessary     [facility] Pager- [phone]

## 2015-07-11 NOTE — Anesthesia Preprocedure Evaluation (Addendum)
Anesthesia Evaluation  Patient identified by MRN, date of birth, ID band Patient awake    Reviewed: Allergy & Precautions, NPO status , Patient's Chart, lab work & pertinent test results  History of Anesthesia Complications Negative for: history of anesthetic complications  Airway Mallampati: II  TM Distance: >3 FB Neck ROM: Full    Dental  (+) Caps, Teeth Intact, Chipped, Dental Advisory Given,    Pulmonary neg pulmonary ROS,    Pulmonary exam normal breath sounds clear to auscultation       Cardiovascular hypertension, Pt. on medications + dysrhythmias Atrial Fibrillation + Valvular Problems/Murmurs MR  Rhythm:Irregular Rate:Abnormal     Neuro/Psych negative neurological ROS     GI/Hepatic negative GI ROS, Neg liver ROS,   Endo/Other    Renal/GU Renal disease     Musculoskeletal  (+) Arthritis ,   Abdominal   Peds  Hematology   Anesthesia Other Findings   Reproductive/Obstetrics                            Anesthesia Physical Anesthesia Plan  ASA: III  Anesthesia Plan: General   Post-op Pain Management:    Induction: Intravenous  Airway Management Planned: Oral ETT  Additional Equipment:   Intra-op Plan:   Post-operative Plan: Possible Post-op intubation/ventilation and Extubation in OR  Informed Consent: I have reviewed the patients History and Physical, chart, labs and discussed the procedure including the risks, benefits and alternatives for the proposed anesthesia with the patient or authorized representative who has indicated his/her understanding and acceptance.     Plan Discussed with: CRNA, Anesthesiologist and Surgeon  Anesthesia Plan Comments:         Anesthesia Quick Evaluation

## 2015-07-11 NOTE — Op Note (Signed)
07/10/2015 - 07/11/2015  5:19 PM  PATIENT:  Martin Conner   MRN: [id number]  PRE-OPERATIVE DIAGNOSIS:  Fracture of femoral neck, right ([place])  POST-OPERATIVE DIAGNOSIS:  Fracture of femoral neck, right ([facility])  PROCEDURE:  Procedure(s): TOTAL HIP ARTHROPLASTY  PREOPERATIVE INDICATIONS:    Martin Conner is an 82 y.o. male who has a diagnosis of Fracture of femoral neck, right ([place]) and elected for surgical management after failing conservative treatment.  The risks benefits and alternatives were discussed with the patient including but not limited to the risks of nonoperative treatment, versus surgical intervention including infection, bleeding, nerve injury, periprosthetic fracture, the need for revision surgery, dislocation, leg length discrepancy, blood clots, cardiopulmonary complications, morbidity, mortality, among others, and they were willing to proceed.  He's had a previous left total hip replacement, that was done in 2009 with excellent results, and he complained of severe right hip pain after a fall. His right hip also bothers him before his fall, and had progressive degenerative changes, and so in the combination of a basocervical femoral neck fracture and a greater trochanteric hip fracture with osteoarthritis I recommended total hip replacement to minimize his recovery and optimize his long-term function.   OPERATIVE REPORT     SURGEON:  Marchia Bond, MD    ASSISTANT:  Joya Gaskins, OPA-C  (Present throughout the entire procedure,  necessary for completion of procedure in a timely manner, assisting with retraction, instrumentation, and closure)     ANESTHESIA:  Gen.    COMPLICATIONS:  None.     UNIQUE ASPECTS OF THE CASE:  The femoral head was extremely large, and had advanced degenerative changes. The greater trochanter was still in soft tissue continuity with the rest of the bone, not completely free, and did not require any fixation. Additionally the anterior crack in the  basocervical femoral neck region was actually not that clinically significant from the standpoint of the surgical intervention. I did use an AML femoral component in order to bypass the proximal metaphysis, which did have the crack, but I was able to perform a standard total hip replacement using a femoral neck cut, and instrument through the proximal femur. The acetabulum was extremely large, and I ended up putting in a size 60 cup. I debated using a posterior lip, however ended up using a standard because there was some impingement posteriorly with external rotation and extension. He felt very stable, and he felt too long with the standard neck length, and so I went with a minus.  COMPONENTS:  Depuy AML femur size 16.5 large with a 36 mm -2 metal head ball and a gription acetabular shell size 60 with an apex hole eliminator and a neutral +4 polyethylene liner.    PROCEDURE IN DETAIL:   The patient was met in the holding area and  identified.  The appropriate hip was identified and marked at the operative site.  The patient was then transported to the OR  and  placed under anesthesia.  At that point, the patient was  placed in the lateral decubitus position with the operative side up and  secured to the operating room table and all bony prominences padded.     The operative lower extremity was prepped from the iliac crest to the distal leg.  Sterile draping was performed.  Time out was performed prior to incision.      A routine posterolateral approach was utilized via sharp dissection  carried down to the subcutaneous tissue.  Gross bleeders were  Bovie coagulated.  The iliotibial band was identified and incised along the length of the skin incision.  Self-retaining retractors were  inserted.  With the hip internally rotated, the short external rotators  were identified. The piriformis and capsule was tagged with FiberWire, and the hip capsule released in a T-type fashion.  The femoral neck was exposed,  and I resected the femoral neck using the appropriate jig. This was performed at approximately a thumb's breadth above the lesser trochanter. I evaluated the femoral neck, and actually it was still in continuity with the rest of the femur, I suspect that the anterior crack was not enough to totally destabilize the neck.    I then exposed the deep acetabulum, cleared out any tissue including the ligamentum teres.  A wing retractor was placed.  After adequate visualization, I excised the labrum, and then sequentially reamed.  I placed the trial acetabulum, which seated nicely, and then impacted the real cup into place.  Appropriate version and inclination was confirmed clinically matching their bony anatomy, and also with the use of the jig.  A trial polyethylene liner was placed and the wing retractor removed.    I then prepared the proximal femur using the cookie-cutter, the lateralizing reamer, and then sequentially reamed and broached.  A trial broach, neck, and head was utilized, and I reduced the hip and it was found to have excellent stability with functional range of motion. The +1.5 was slightly long, and so I selected the -2. Additionally, the 10 lipped liner had posterior impingement, so I selected the neutral liner. I had excellent stability than posteriorly and anteriorly.   The trial components were then removed, and the real polyethylene liner was placed.  I then impacted the real femoral prosthesis into place into the appropriate version, slightly anteverted to the normal anatomy, and I impacted the real head ball into place. The hip was then reduced and taken through functional range of motion and found to have excellent stability. Leg lengths were restored.  I then used a 2 mm drill bits to pass the FiberWire suture from the capsule and piriformis through the greater trochanter, and secured this. Excellent posterior capsular repair was achieved. I also closed the T in the capsule.  I  then irrigated the hip copiously again with pulse lavage, and repaired the fascia with Vicryl, followed by Vicryl for the subcutaneous tissue, Monocryl for the skin, Steri-Strips and sterile gauze. The wounds were injected. The patient was then awakened and returned to PACU in stable and satisfactory condition. There were no complications.  Marchia Bond, MD Orthopedic Surgeon [phone]   07/11/2015 5:19 PM

## 2015-07-11 NOTE — H&P (Signed)
History and Physical    Martin Conner [id number] DOB: 01/27/1928 DOA: 07/10/2015  Referring Provider: EDP PCP: Viviana Simpler, MD  Outpatient Specialists: Dr. Rayann Heman (cardiology)   Patient coming from: Home  Chief Complaint: Right hip pain   HPI: Martin Conner is a 82 y.o. male with medical history significant for chronic atrial fibrillation on prior DEXA, hypertension, and hyperlipidemia who presents to the ED with severe right hip pain and inability to ambulate following a mechanical ground-level fall the evening prior. Patient had been in his usual state until the evening of 07/09/2015 when he slipped while trying to sit in a chair outside and landed on his right hip. There was immediate pain and inability to ambulate. Pain persisted throughout the day today and so he came into the ED for evaluation. He denies any recent fevers, chills, or cough. He denies any chest pain, palpitations, orthopnea, or lower extremity swelling. There has been no abdominal pain, nausea, vomiting, or diarrhea.  ED Course: Upon arrival to the ED, patient is found to be afebrile, saturating well on room air, modestly hypertensive, but with vitals otherwise stable. Chest x-ray is negative for acute cardiopulmonary disease and EKG features atrial fibrillation with normal rate. BMP is unremarkable and CBC features a leukocytosis to 11,400 and a hemoglobin of 12.6. Radiographs of the hips revealed severe degenerative changes only and this was followed up with CT. Nondisplaced acute fracture at the base of the right femoral neck was noted on CT of the hip and orthopedic surgery was consulted by the EDP. Admission to the hospital was advised for surgical intervention, possibly tomorrow, but more likely on 07/12/2015.  Review of Systems:  All other systems reviewed and apart from HPI, are negative.  Past Medical History  Diagnosis Date  . Hyperlipidemia   . Hypertensive heart disease   . Lumbar disc disease   .  Osteoarthritis   . Chronic atrial fibrillation ([place])     a. 07/2012 Echo: EF 55-60%, no rwma, mildly dil RA/LA.  Marland Kitchen Syncope     a. 07/2012 - unclear etiology-->resulted in right tib/fib fx req ORIF/intramedullary nail of tibia.    Past Surgical History  Procedure Laterality Date  . Total hip arthroplasty      left  . Ankle surgery      right ankle  . Tibia im nail insertion Right 07/21/2012    Procedure: INTRAMEDULLARY (IM) NAIL TIBIAL;  Surgeon: Johnny Bridge, MD;  Location: [facility];  Service: Orthopedics;  Laterality: Right;     reports that he has never smoked. He has never used smokeless tobacco. He reports that he drinks about 7.2 oz of alcohol per week. He reports that he does not use illicit drugs.  No Known Allergies  Family History  Problem Relation Age of Onset  . Cancer Mother   . Hyperlipidemia Sister   . Hypertension Sister      Prior to Admission medications   Medication Sig Start Date End Date Taking? Authorizing Provider  losartan (COZAAR) 100 MG tablet Take 50 mg by mouth 2 (two) times daily.  06/18/15  Yes Historical Provider, MD  naproxen sodium (ANAPROX) 220 MG tablet Take 220 mg by mouth 2 (two) times daily with a meal.   Yes Historical Provider, MD  PRADAXA 110 MG CAPS Take 110 mg by mouth 2 (two) times daily. 06/18/15  Yes Historical Provider, MD    Physical Exam: Filed Vitals:   07/10/15 1607 07/10/15 1853 07/10/15 2100  BP: 169/98  147/90 168/123  Pulse: 105 61 104  Temp: 98.1 F (36.7 C)    TempSrc: Oral    Resp: 20 16   Weight: 72.122 kg (159 lb)    SpO2: 96% 96% 98%      Constitutional: NAD, calm, comfortable Eyes: PERTLA, lids and conjunctivae normal ENMT: Mucous membranes are moist. Posterior pharynx clear of any exudate or lesions. Normal dentition.  Neck: normal, supple, no masses, no thyromegaly Respiratory: clear to auscultation bilaterally, no wheezing, no crackles. Normal respiratory effort. No accessory muscle use.  Cardiovascular:  S1 & S2 heard, irregular rhythm, no murmurs / rubs / gallops. No extremity edema. 2+ pedal pulses. No carotid bruits.  Abdomen: No distension, no tenderness, no masses palpated. No hepatosplenomegaly. Bowel sounds normal.  Musculoskeletal: no clubbing / cyanosis. No gross joint deformity upper and lower extremities. Normal muscle tone. RLE neurovascularly intact.  Skin: no rashes, lesions, ulcers. No induration Neurologic: CN 2-12 grossly intact. Sensation to light touch intact, DTR normal. Strength 5/5 in all 4 limbs.  Psychiatric: Normal judgment and insight. Alert and oriented x 3. Normal mood.     Labs on Admission: I have personally reviewed following labs and imaging studies  CBC:  Recent Labs Lab 07/10/15 2235  WBC 11.4*  NEUTROABS 8.5*  HGB 12.6*  HCT 37.0*  MCV 94.4  PLT 123456   Basic Metabolic Panel:  Recent Labs Lab 07/10/15 2235  NA 137  K 3.9  CL 104  CO2 22  GLUCOSE 103*  BUN 15  CREATININE 1.02  CALCIUM 9.0   GFR: Estimated Creatinine Clearance: 54 mL/min (by C-G formula based on Cr of 1.02). Liver Function Tests: No results for input(s): AST, ALT, ALKPHOS, BILITOT, PROT, ALBUMIN in the last 168 hours. No results for input(s): LIPASE, AMYLASE in the last 168 hours. No results for input(s): AMMONIA in the last 168 hours. Coagulation Profile:  Recent Labs Lab 07/11/15 0015  INR 1.34   Cardiac Enzymes: No results for input(s): CKTOTAL, CKMB, CKMBINDEX, TROPONINI in the last 168 hours. BNP (last 3 results) No results for input(s): PROBNP in the last 8760 hours. HbA1C: No results for input(s): HGBA1C in the last 72 hours. CBG: No results for input(s): GLUCAP in the last 168 hours. Lipid Profile: No results for input(s): CHOL, HDL, LDLCALC, TRIG, CHOLHDL, LDLDIRECT in the last 72 hours. Thyroid Function Tests: No results for input(s): TSH, T4TOTAL, FREET4, T3FREE, THYROIDAB in the last 72 hours. Anemia Panel: No results for input(s): VITAMINB12,  FOLATE, FERRITIN, TIBC, IRON, RETICCTPCT in the last 72 hours. Urine analysis:    Component Value Date/Time   COLORURINE YELLOW 07/21/2012 1145   APPEARANCEUR CLEAR 07/21/2012 1145   LABSPEC 1.016 07/21/2012 1145   PHURINE 6.0 07/21/2012 1145   GLUCOSEU NEGATIVE 07/21/2012 1145   HGBUR LARGE* 07/21/2012 1145   BILIRUBINUR NEGATIVE 07/21/2012 1145   KETONESUR 40* 07/21/2012 1145   PROTEINUR NEGATIVE 07/21/2012 1145   UROBILINOGEN 2.0* 07/21/2012 1145   NITRITE NEGATIVE 07/21/2012 1145   LEUKOCYTESUR NEGATIVE 07/21/2012 1145   Sepsis Labs: @LABRCNTIP (procalcitonin:4,lacticidven:4) )No results found for this or any previous visit (from the past 240 hour(s)).   Radiological Exams on Admission: Ct Hip Right Wo Contrast  07/10/2015  CLINICAL DATA:  Right hip pain and unable to bear weight after a fall yesterday. EXAM: CT OF THE RIGHT HIP WITHOUT CONTRAST TECHNIQUE: Multidetector CT imaging of the right hip was performed according to the standard protocol. Multiplanar CT image reconstructions were also generated. COMPARISON:  Right hip radiographs  07/10/2015 FINDINGS: Degenerative changes in the right hip with complete loss of joint space, sclerosis, degenerative cysts on both sides of the joint, and prominent osteophytes on both sides of the joint. Inter trochanteric linear lucency at the base of the femoral neck, extending to the greater trochanter consistent with comminuted and nondisplaced acute fracture. Lesser trochanter does not appear to be involved. No significant hematoma or soft tissue swelling. Visualized pelvic structures appear intact. IMPRESSION: Nondisplaced acute fracture of the base of the femoral neck at the inter trochanteric region with extension to the greater trochanter. Severe degenerative changes in the right hip. Electronically Signed   By: Lucienne Capers M.D.   On: 07/10/2015 23:03   Dg Chest Port 1 View  07/10/2015  CLINICAL DATA:  Preop hip surgery. EXAM: PORTABLE  CHEST 1 VIEW COMPARISON:  05/12/2013 FINDINGS: The cardiomediastinal contours are unchanged, heart at the upper limits of normal in size. The lungs are clear. Pulmonary vasculature is normal. No consolidation, pleural effusion, or pneumothorax. No acute osseous abnormalities are seen. IMPRESSION: No acute process. Electronically Signed   By: Jeb Levering M.D.   On: 07/10/2015 23:33   Dg Hip Unilat  With Pelvis 2-3 Views Right  07/10/2015  CLINICAL DATA:  Lateral RIGHT hip pain after falling [place], unable to bear weight on RIGHT leg, limited range of motion RIGHT hip EXAM: DG HIP (WITH OR WITHOUT PELVIS) 2-3V RIGHT COMPARISON:  02/01/2008 FINDINGS: Diffuse osseous demineralization. Components of a LEFT hip prosthesis identified. Marked narrowing of RIGHT hip joint with minimal subchondral sclerosis, marginal spurs, bone on bone appearance and question subchondral cyst formation at femoral head. No definite acute fracture, dislocation or bone destruction. IMPRESSION: Progressive osteoarthritic changes RIGHT hip since 2009. Osseous demineralization. No definite acute bony abnormalities identified; if patient has persistent pain, may consider followup MR imaging. Electronically Signed   By: Lavonia Dana M.D.   On: 07/10/2015 16:36   Dg Femur, Min 2 Views Right  07/10/2015  CLINICAL DATA:  Right hip pain since a fall yesterday. EXAM: RIGHT FEMUR 2 VIEWS COMPARISON:  None. FINDINGS: Diffuse bone demineralization. Severe degenerative changes in the right hip with near complete narrowing of the acetabular joint space, bone sclerosis and subcortical cysts, and prominent osteophyte formation. No evidence of acute fracture or dislocation in the right femur. Mild degenerative changes in the right knee. No effusion. Vascular calcifications. Postoperative changes in the proximal tibia. IMPRESSION: Degenerative changes in the right hip.  No acute bony abnormalities. Electronically Signed    By: Lucienne Capers M.D.   On: 07/10/2015 21:48    EKG: Independently reviewed.  Atrial fibrillation, early R-wave transition  Assessment/Plan  1. Right intertrochanteric femur fracture  - Suffered ground-level mechanical fall  - Agricultural engineer and much appreciated  - Hold Pradaxa, keep NPO  - Pain-control with ice pack, prn morphine  - Type and screen, EKG, CXR, UA ordered and pending  - Hold losartan prior to surgery  - Pt has tolerated general anesthesia without incident in past  - Able to ascend a flight of stairs without angina or significant dyspnea   2. Chronic atrial fibrillation  - In atrial fibrillation with normal rate in ED  - Holding Pradaxa for anticipated surgery  - CHADS-VASc is 71 (age x2 and HTN)   3. Hypertension  - BP is elevated beyond goal currently; pain may be contributing  - Pain-control  - Managed with losartan at home; holding prior to surgery   -  Treat with hydralazine IVPs prn while NPO    4. Anemia  - Hgb 12.6 on admission, a little lower than priors  - No sign of active blood loss  - Type and screen has been performed  - Monitor    DVT prophylaxis: sq heparin  Code Status: Full  Family Communication: None present  Disposition Plan: Admit to med-surg  Consults called: Orthopedic surgery   Admission status: Inpatient     Vianne Bulls MD [facility] Pager [phone]  If 7PM-7AM, please contact night-coverage [email or web address] Password [facility]  07/11/2015, 12:55 AM

## 2015-07-11 NOTE — H&P (Signed)
PREOPERATIVE H&P  Chief Complaint: Right Hip Fracture  HPI: Martin Conner is a 82 y.o. male who presents for preoperative history and physical with a diagnosis of Right Hip Fracture. Symptoms are rated as moderate to severe, and have been worsening.  This is significantly impairing activities of daily living.  He has elected for surgical management.   He has had a previous left total hip replacement and done well, that was back in 2009.  His right hip has had chronic pain, with known right hip OA, and then He has had a fall, and presented to the emergency room with a basocervical femoral neck fracture extending into the greater trochanter. He has elected for surgical management.    Past Medical History  Diagnosis Date  . Hyperlipidemia   . Hypertensive heart disease   . Lumbar disc disease   . Osteoarthritis   . Chronic atrial fibrillation ([place])     a. 07/2012 Echo: EF 55-60%, no rwma, mildly dil RA/LA.  Marland Kitchen Syncope     a. 07/2012 - unclear etiology-->resulted in right tib/fib fx req ORIF/intramedullary nail of tibia.   Past Surgical History  Procedure Laterality Date  . Total hip arthroplasty      left  . Ankle surgery      right ankle  . Tibia im nail insertion Right 07/21/2012    Procedure: INTRAMEDULLARY (IM) NAIL TIBIAL;  Surgeon: Johnny Bridge, MD;  Location: [facility];  Service: Orthopedics;  Laterality: Right;   Social History   Social History  . Marital Status: Married    Spouse Name: N/A  . Number of Children: 2  . Years of Education: N/A   Occupational History  . retired  At [employer]   Social History Main Topics  . Smoking status: Never Smoker   . Smokeless tobacco: Never Used  . Alcohol Use: 7.2 oz/week    12 Cans of beer per week     Comment: 2 beers several x/wk and occas cocktail on top of it.  . Drug Use: No  . Sexual Activity: Not Asked   Other Topics Concern  . None   Social History Narrative   No living will   Requests that wife make decisions for him if  needed   Would accept resuscitation but no prolonged artificial ventilation   Would not want tube feeds if cognitively unaware   Family History  Problem Relation Age of Onset  . Cancer Mother   . Hyperlipidemia Sister   . Hypertension Sister    No Known Allergies Prior to Admission medications   Medication Sig Start Date End Date Taking? Authorizing Provider  losartan (COZAAR) 100 MG tablet Take 50 mg by mouth 2 (two) times daily.  06/18/15  Yes Historical Provider, MD  naproxen sodium (ANAPROX) 220 MG tablet Take 220 mg by mouth 2 (two) times daily with a meal.   Yes Historical Provider, MD  PRADAXA 110 MG CAPS Take 110 mg by mouth 2 (two) times daily. 06/18/15  Yes Historical Provider, MD     Positive ROS: All other systems have been reviewed and were otherwise negative with the exception of those mentioned in the HPI and as above.  Physical Exam: General: Alert, no acute distress Cardiovascular: No pedal edema Respiratory: No cyanosis, no use of accessory musculature GI: No organomegaly, abdomen is soft and non-tender Skin: No lesions in the area of chief complaint Neurologic: Sensation intact distally Psychiatric: Patient is competent for consent with normal mood and affect Lymphatic:  No axillary or cervical lymphadenopathy  MUSCULOSKELETAL: Right hip has positive log roll, EHL and FHL are intact, sensation intact throughout the foot. Unable to lift his leg off the bed.  Assessment: Right basocervical femoral neck fracture with greater trochanteric extension, with coexisting osteoarthritis   Plan: Plan for Procedure(s): TOTAL HIP ARTHROPLASTY  The risks benefits and alternatives were discussed with the patient including but not limited to the risks of nonoperative treatment, versus surgical intervention including infection, bleeding, nerve injury, periprosthetic fracture, the need for revision surgery, dislocation, leg length discrepancy, blood clots, cardiopulmonary  complications, morbidity, mortality, among others, and they were willing to proceed.     Johnny Bridge, MD Cell [phone]   07/11/2015 3:11 PM

## 2015-07-12 ENCOUNTER — Encounter (HOSPITAL_COMMUNITY): Payer: Self-pay | Admitting: Orthopedic Surgery

## 2015-07-12 DIAGNOSIS — I10 Essential (primary) hypertension: Secondary | ICD-10-CM

## 2015-07-12 DIAGNOSIS — I482 Chronic atrial fibrillation: Secondary | ICD-10-CM

## 2015-07-12 LAB — BASIC METABOLIC PANEL
ANION GAP: 10 (ref 5–15)
BUN: 12 mg/dL (ref 6–20)
CALCIUM: 8.6 mg/dL — AB (ref 8.9–10.3)
CO2: 25 mmol/L (ref 22–32)
Chloride: 102 mmol/L (ref 101–111)
Creatinine, Ser: 1.16 mg/dL (ref 0.61–1.24)
GFR, EST NON AFRICAN AMERICAN: 56 mL/min — AB (ref >60)
Glucose, Bld: 119 mg/dL — ABNORMAL HIGH (ref 65–99)
POTASSIUM: 4.6 mmol/L (ref 3.5–5.1)
SODIUM: 137 mmol/L (ref 135–145)

## 2015-07-12 LAB — CBC
HCT: 29.2 % — ABNORMAL LOW (ref 39.0–52.0)
Hemoglobin: 9.6 g/dL — ABNORMAL LOW (ref 13.0–17.0)
MCH: 31.8 pg (ref 26.0–34.0)
MCHC: 33.2 g/dL (ref 30.0–36.0)
MCV: 95.7 fL (ref 78.0–100.0)
PLATELETS: 177 10*3/uL (ref 150–400)
RBC: 3.05 MIL/uL — AB (ref 4.22–5.81)
RDW: 14.1 % (ref 11.5–15.5)
WBC: 8.8 10*3/uL (ref 4.0–10.5)

## 2015-07-12 MED ORDER — ENSURE ENLIVE PO LIQD
237.0000 mL | Freq: Two times a day (BID) | ORAL | Status: DC
  Administered 2015-07-12 – 2015-07-13 (×2): 237 mL via ORAL

## 2015-07-12 MED ORDER — LOSARTAN POTASSIUM 50 MG PO TABS
50.0000 mg | ORAL_TABLET | Freq: Two times a day (BID) | ORAL | Status: DC
  Administered 2015-07-12 – 2015-07-13 (×2): 50 mg via ORAL
  Filled 2015-07-12 (×2): qty 1

## 2015-07-12 NOTE — Progress Notes (Signed)
Utilization review completed.  

## 2015-07-12 NOTE — Evaluation (Signed)
Physical Therapy Evaluation Patient Details Name: SABASTAIN SIDOR MRN: [id number] DOB: 11-26-1927 Today's Date: 07/12/2015   History of Present Illness  82 y.o. male with h/o L THA 2009 admitted with fall, R hip fx, s/p R THA posterior approach.   Clinical Impression  Pt is s/p THA resulting in the deficits listed below (see PT Problem List). Pt ambulated 8' with RW and min assist, distance limited by fatigue. If progress is slow, pt may require ST-SNF.  Pt will benefit from skilled PT to increase their independence and safety with mobility to allow discharge to the venue listed below.      Follow Up Recommendations SNF;Supervision for mobility/OOB    Equipment Recommendations  None recommended by PT    Recommendations for Other Services       Precautions / Restrictions Precautions Precautions: Posterior Hip Precaution Booklet Issued: Yes (comment) Precaution Comments: instructed pt in posterior THA precautions, issued handout Restrictions Weight Bearing Restrictions: No RLE Weight Bearing: Weight bearing as tolerated      Mobility  Bed Mobility Overal bed mobility: Needs Assistance Bed Mobility: Supine to Sit     Supine to sit: Mod assist     General bed mobility comments: assist to raise trunk, verbal cues for technique, assist to support RLE  Transfers Overall transfer level: Needs assistance Equipment used: Rolling walker (2 wheeled) Transfers: Sit to/from Stand Sit to Stand: Mod assist;From elevated surface         General transfer comment: verbal cues hand placement, assist to rise  Ambulation/Gait Ambulation/Gait assistance: Min assist Ambulation Distance (Feet): 8 Feet Assistive device: Rolling walker (2 wheeled) Gait Pattern/deviations: Step-to pattern;Decreased step length - right;Decreased step length - left   Gait velocity interpretation: at or above normal speed for age/gender    Stairs            Wheelchair Mobility    Modified Rankin  (Stroke Patients Only)       Balance Overall balance assessment: Needs assistance   Sitting balance-Leahy Scale: Good     Standing balance support: Bilateral upper extremity supported Standing balance-Leahy Scale: Poor                               Pertinent Vitals/Pain Pain Assessment: 0-10 Pain Score: 5  Pain Location: R hip Pain Descriptors / Indicators: Sore Pain Intervention(s): Premedicated before session;Monitored during session;Limited activity within patient's tolerance;Ice applied    Home Living Family/patient expects to be discharged to:: Private residence Living Arrangements: Spouse/significant other Available Help at Discharge: Available 24 hours/day Type of Home: House Home Access: Stairs to enter   [employer] of Steps: 2 Home Layout: One level Home Equipment: [place] - 2 wheels;Bedside commode;Cane - single point      Prior Function Level of Independence: Independent               Hand Dominance        Extremity/Trunk Assessment   Upper Extremity Assessment: Overall WFL for tasks assessed           Lower Extremity Assessment: RLE deficits/detail RLE Deficits / Details: R knee ext -3/5, hip -3/5, sensation intact    Cervical / Trunk Assessment: Normal  Communication   Communication: HOH  Cognition Arousal/Alertness: Awake/alert Behavior During Therapy: WFL for tasks assessed/performed Overall Cognitive Status: Within Functional Limits for tasks assessed  General Comments      Exercises Total Joint Exercises Ankle Circles/Pumps: AROM;Both;10 reps;Supine Quad Sets: AROM;Both;5 reps;Supine Heel Slides: AAROM;Right;10 reps;Supine Hip ABduction/ADduction: AAROM;Right;10 reps;Supine      Assessment/Plan    PT Assessment Patient needs continued PT services  PT Diagnosis Difficulty walking;Acute pain   PT Problem List Decreased strength;Decreased activity tolerance;Decreased  balance;Pain;Decreased knowledge of precautions;Decreased mobility  PT Treatment Interventions DME instruction;Gait training;Stair training;Functional mobility training;Therapeutic activities;Patient/family education;Therapeutic exercise   PT Goals (Current goals can be found in the Care Plan section) Acute Rehab PT Goals Patient Stated Goal: to walk more PT Goal Formulation: With patient Time For Goal Achievement: 07/26/15 Potential to Achieve Goals: Good    Frequency 7X/week   Barriers to discharge        Co-evaluation               End of Session Equipment Utilized During Treatment: Gait belt Activity Tolerance: Patient tolerated treatment well Patient left: in chair;with call bell/phone within reach;with chair alarm set Nurse Communication: Mobility status         Time: [id number] PT Time Calculation (min) (ACUTE ONLY): 35 min   Charges:   PT Evaluation $PT Eval Low Complexity: 1 Procedure PT Treatments $Gait Training: 8-22 mins   PT G Codes:        [name] 07/12/2015, 9:59 AM [phone]

## 2015-07-12 NOTE — Clinical Social Work Placement (Addendum)
   CLINICAL SOCIAL WORK PLACEMENT  NOTE  Date:  07/12/2015  Patient Details  Name: Martin Conner MRN: [id number] Date of Birth: 11/26/1927  Clinical Social Work is seeking post-discharge placement for this patient at the [place] level of care (*CSW will initial, date and re-position this form in  chart as items are completed):  No   Patient/family provided with [facility] Work Department's list of facilities offering this level of care within the geographic area requested by the patient (or if unable, by the patient's family).  Yes   Patient/family informed of their freedom to choose among providers that offer the needed level of care, that participate in Medicare, Medicaid or managed care program needed by the patient, have an available bed and are willing to accept the patient.  Yes   Patient/family informed of [facility]'s ownership interest in [facility] and [facility], as well as of the fact that they are under no obligation to receive care at these facilities.  PASRR submitted to EDS on 07/12/15     PASRR number received on       Existing PASRR number confirmed on 07/12/15     FL2 transmitted to all facilities in geographic area requested by pt/family on 07/12/15     FL2 transmitted to all facilities within larger geographic area on       Patient informed that his/her managed care company has contracts with or will negotiate with certain facilities, including the following:         07-12-15  Patient/family informed of bed offers received.  (Updated Evette Cristal, MSW, [place], 07-13-15)  Patient chooses bed at  [facility]  (Updated Evette Cristal, MSW, [place], 07-13-15)  Physician recommends and patient chooses bed at      Patient to be transferred to  [facility] on  07-13-15 (Updated Evette Cristal, MSW, [place], 07-13-15).  Patient to be transferred to facility by  [facility] (Updated Evette Cristal, MSW, [place], 07-13-15)  Patient  family notified on  07-13-15 of transfer. (Updated Evette Cristal, MSW, [place], 07-13-15)  Name of family member notified:   Mellody Drown patient's wife (Updated Evette Cristal, MSW, [place], 07-13-15)     PHYSICIAN Please sign FL2     Additional Comment:    _______________________________________________ Ross Ludwig, LCSWA 07/12/2015, 1:07 PM  Jones Broom. [place], MSW, [place] 07/13/2015 1:17 PM

## 2015-07-12 NOTE — Progress Notes (Signed)
Physical Therapy Treatment Patient Details Name: Martin Conner MRN: [id number] DOB: 01-20-1928 Today's Date: 07/12/2015    History of Present Illness 82 y.o. male with h/o L THA 2009 admitted with fall, R hip fx, s/p R THA posterior approach.     PT Comments    Pt progressing with mobility, he ambulated 47' with RW and performed THA exercises with min assist. He recalled 2 of 3 posterior hip precautions.   Follow Up Recommendations  SNF;Supervision for mobility/OOB     Equipment Recommendations  None recommended by PT    Recommendations for Other Services       Precautions / Restrictions Precautions Precautions: Posterior Hip Precaution Booklet Issued: Yes (comment) Precaution Comments: reviewed posterior THA precautions with pt, instructed wife in precautions, pt recalled 2 of 3 precautions Restrictions Weight Bearing Restrictions: No RLE Weight Bearing: Weight bearing as tolerated    Mobility  Bed Mobility Overal bed mobility: Needs Assistance Bed Mobility: Sit to Supine     Supine to sit: Min assist     General bed mobility comments: min A for BLEs into bed, pt able to independently scoot up in bed using armrails  Transfers Overall transfer level: Needs assistance Equipment used: Rolling walker (2 wheeled) Transfers: Sit to/from Stand Sit to Stand: Min assist         General transfer comment: verbal cues hand placement, assist to rise and to control descent  Ambulation/Gait Ambulation/Gait assistance: Min guard Ambulation Distance (Feet): 14 Feet Assistive device: Rolling walker (2 wheeled) Gait Pattern/deviations: Step-through pattern;Decreased step length - right   Gait velocity interpretation: Below normal speed for age/gender General Gait Details: distance limited by pain/fatigue, steady with RW, verbal cues for sequencing   Stairs            Wheelchair Mobility    Modified Rankin (Stroke Patients Only)       Balance Overall balance  assessment: Needs assistance   Sitting balance-Leahy Scale: Good     Standing balance support: Bilateral upper extremity supported Standing balance-Leahy Scale: Poor                      Cognition Arousal/Alertness: Awake/alert Behavior During Therapy: WFL for tasks assessed/performed Overall Cognitive Status: Within Functional Limits for tasks assessed                      Exercises Total Joint Exercises Ankle Circles/Pumps: AROM;Both;10 reps;Supine Quad Sets: AROM;Both;Supine;10 reps Heel Slides: AAROM;Right;10 reps;Supine Hip ABduction/ADduction: AAROM;Right;10 reps;Supine    General Comments        Pertinent Vitals/Pain Pain Score: 7  Pain Location: R hip with activity Pain Descriptors / Indicators: Sore Pain Intervention(s): Monitored during session;Limited activity within patient's tolerance;Patient requesting pain meds-RN notified;Ice applied    Home Living                      Prior Function            PT Goals (current goals can now be found in the care plan section) Acute Rehab PT Goals Patient Stated Goal: to walk for exercise PT Goal Formulation: With patient Time For Goal Achievement: 07/26/15 Potential to Achieve Goals: Good Progress towards PT goals: Progressing toward goals    Frequency  7X/week    PT Plan Current plan remains appropriate    Co-evaluation             End of Session Equipment Utilized During Treatment: Gait belt  Activity Tolerance: Patient tolerated treatment well Patient left: in chair;with call bell/phone within reach;in bed;with bed alarm set     Time: 1227-1250 PT Time Calculation (min) (ACUTE ONLY): 23 min  Charges:  $Gait Training: 8-22 mins $Therapeutic Exercise: 8-22 mins                    G Codes:      [name] 07/12/2015, 12:56 PM [phone]

## 2015-07-12 NOTE — Clinical Social Work Note (Signed)
CSW spoke to patient and family to present bed offers, patient would like to go to [facility] however they do not have any beds available.  CSW informed family that [facility], and [facility] beds available, patient and family agreed to go to [facility] who said they can take patient either Friday or Saturday once he is medically ready for discharge and orders have been received.  CSW to continue to follow patient's progress.  Jones Broom. [place], MSW, [place] 07/12/2015 3:28 PM

## 2015-07-12 NOTE — Progress Notes (Signed)
Patient ID: Martin Conner, male   DOB: [birth year], 82 y.o.   MRN: [id number]     Subjective:  Patient reports pain as mild.  Patient states that he is doing much better.  Denies any CP or SOB  Objective:   VITALS:   Filed Vitals:   07/11/15 1935 07/11/15 1955 07/12/15 0045 07/12/15 1300  BP: 126/77 155/69 110/61 118/51  Pulse: 83 76 93 92  Temp:  97.8 F (36.6 C) 97.8 F (36.6 C) 98.7 F (37.1 C)  TempSrc:  Oral Oral   Resp: 21 18 16 18   Weight:      SpO2: 100% 99% 96%     ABD soft Sensation intact distally Dorsiflexion/Plantar flexion intact Incision: dressing C/D/I and no drainage   Lab Results  Component Value Date   WBC 8.8 07/12/2015   HGB 9.6* 07/12/2015   HCT 29.2* 07/12/2015   MCV 95.7 07/12/2015   PLT 177 07/12/2015   BMET    Component Value Date/Time   NA 137 07/12/2015 0511   K 4.6 07/12/2015 0511   CL 102 07/12/2015 0511   CO2 25 07/12/2015 0511   GLUCOSE 119* 07/12/2015 0511   BUN 12 07/12/2015 0511   CREATININE 1.16 07/12/2015 0511   CREATININE 1.20* 06/06/2015 1003   CALCIUM 8.6* 07/12/2015 0511   GFRNONAA 56* 07/12/2015 0511   GFRAA >60 07/12/2015 0511     Assessment/Plan: 1 Day Post-Op   Principal Problem:   Fracture of femoral neck, right (HCC) Active Problems:   Hyperlipemia   Essential hypertension, benign   Chronic atrial fibrillation (HCC)   Normocytic anemia   Advance diet Up with therapy Continue plan per medicine WBAT Dry dressing PRN ABLA continue to monitor   DOUGLAS PARRY, BRANDON 07/12/2015, 4:40 PM  Discussed and agree with above.    Marchia Bond, MD Cell [phone]

## 2015-07-12 NOTE — Progress Notes (Signed)
Initial Nutrition Assessment  DOCUMENTATION CODES:   Not applicable  INTERVENTION:  Provide Ensure Enlive po BID, each supplement provides 350 kcal and 20 grams of protein  Encourage adequate PO intake.   NUTRITION DIAGNOSIS:   Inadequate oral intake related to poor appetite as evidenced by meal completion < 25%.  GOAL:   Patient will meet greater than or equal to 90% of their needs  MONITOR:   PO intake, Supplement acceptance, Weight trends, Labs, I & O's  REASON FOR ASSESSMENT:   Consult Hip fracture protocol  ASSESSMENT:   82 y.o. male with medical history significant for chronic atrial fibrillation on prior DEXA, hypertension, and hyperlipidemia who presents to the ED with severe right hip pain and inability to ambulate following a mechanical ground-level fall the evening prior.Nondisplaced acute fracture at the base of the right femoral neck was noted on CT of the hip.  Procedure(4/19): TOTAL HIP ARTHROPLASTY (Right)  Pt reports currently having a lack of appetite with meal completion this AM at 25% with consumption of only a couple of bites at breakfast. Pt however reports having a good appetite PTA with consumption of at least 2-3 meals a day with no other difficulties. Usual body weight reported to be ~179 lbs. Per Epic weight records, pt with a 11.1% weight loss. Pt unaware of weight loss. Pt is agreeable to Ensure to aid in caloric and protein needs. RD to order. Pt was encouraged to eat his food at meals.   Nutrition-Focused physical exam completed. Findings are no fat depletion, mild to moderate muscle depletion, and no edema. Mild depletion likely associated with the natural aging process.  Labs and medications reviewed.   Diet Order:  Diet Heart Room service appropriate?: Yes; Fluid consistency:: Thin  Skin:   (Incision on R hip)  Last BM:  unknown  Height:   Ht Readings from Last 1 Encounters:  06/06/15 5' 10.5" (1.791 m)    Weight:   Wt Readings  from Last 1 Encounters:  07/10/15 159 lb (72.122 kg)    Ideal Body Weight:  76.8 kg  BMI:  Body mass index is 22.48 kg/(m^2).  Estimated Nutritional Needs:   Kcal:  1800-2000  Protein:  85-100 grams  Fluid:  1.8 - 2 L/day  EDUCATION NEEDS:   No education needs identified at this time  Corrin Parker, MS, RD, LDN Pager # [phone] After hours/ weekend pager # [phone]

## 2015-07-12 NOTE — Progress Notes (Signed)
OT Cancellation Note  Patient Details Name: Martin Conner MRN: [id number] DOB: Jun 23, 1927   Cancelled Treatment:    Reason Eval/Treat Not Completed: Other (comment) (Plan is SNF; will defer OT to next venue.) Spoke with SW; no OT eval needed for SNF placement. Please re-consult if OT eval needed for placement or d/c plan changes.   Binnie Kand M.S., OTR/L Pager: [phone]  07/12/2015, 4:00 PM

## 2015-07-12 NOTE — [place] FL2 (Signed)
[place] MEDICAID FL2 LEVEL OF CARE SCREENING TOOL     IDENTIFICATION  Patient Name: Martin Conner Birthdate: 22-Oct-1927 Sex: male Admission Date (Current Location): 07/10/2015  [facility] and [place] Number:  Herbalist and Address:  [facility] [place], [place] [address], [place],  [address]      Provider Number: [id number]  Attending Physician Name and Address:  Oswald Hillock, MD  Relative Name and Phone Number:  Diogo, Rydalch [phone] or [phone]    Current Level of Care: Hospital Recommended Level of Care: [place] Prior Approval Number:    Date Approved/Denied:   PASRR Number: [id number]  Discharge Plan: SNF    Current Diagnoses: Patient Active Problem List   Diagnosis Date Noted  . Normocytic anemia 07/11/2015  . Fracture of femoral neck, right ([place]) 07/10/2015  . Acute on chronic renal failure ([place])   . Complete heart block ([place]) 05/13/2015  . Hypertensive heart disease   . Chronic atrial fibrillation ([place])   . Hyperlipidemia   . Asystole ([place])   . Advanced directives, counseling/discussion 02/03/2014  . Atrial fibrillation ([place])   . Routine general medical examination at a health care facility 01/30/2012  . MITRAL REGURGITATION 06/05/2009  . Osteoarthrosis, generalized, involving multiple sites 01/20/2008  . DEGENERATIVE DISC DISEASE, LUMBAR SPINE 08/09/2007  . Hyperlipemia 05/26/2007  . Essential hypertension, benign 05/26/2007    Orientation RESPIRATION BLADDER Height & Weight     Time, Situation, Place, Self  O2 (2 L) Continent Weight: 159 lb (72.122 kg) Height:     BEHAVIORAL SYMPTOMS/MOOD NEUROLOGICAL BOWEL NUTRITION STATUS      Continent Diet (Cardiac Diet)  AMBULATORY STATUS COMMUNICATION OF NEEDS Skin   Limited Assist Verbally Surgical wounds                       Personal Care Assistance Level of Assistance  Bathing, Dressing Bathing Assistance: Limited assistance   Dressing  Assistance: Limited assistance     Functional Limitations Info             SPECIAL CARE FACTORS FREQUENCY  PT (By licensed PT)     PT Frequency: 5x a week              Contractures      Additional Factors Info  Code Status, Allergies Code Status Info: Full Code Allergies Info: NKA           Current Medications (07/12/2015):  This is the current hospital active medication list Current Facility-Administered Medications  Medication Dose Route Frequency Provider Last Rate Last Dose  . 0.9 %  sodium chloride infusion  75 mL/hr Intravenous Continuous Marchia Bond, MD 75 mL/hr at 07/11/15 2300 75 mL/hr at 07/11/15 2300  . acetaminophen (TYLENOL) tablet 650 mg  650 mg Oral Q6H PRN Marchia Bond, MD       Or  . acetaminophen (TYLENOL) suppository 650 mg  650 mg Rectal Q6H PRN Marchia Bond, MD      . alum & mag hydroxide-simeth (MAALOX/MYLANTA) 200-200-20 MG/5ML suspension 30 mL  30 mL Oral Q4H PRN Marchia Bond, MD      . bisacodyl (DULCOLAX) suppository 10 mg  10 mg Rectal Daily PRN Marchia Bond, MD      . dabigatran (PRADAXA) capsule 150 mg  150 mg Oral BID Marchia Bond, MD   150 mg at 07/12/15 1019  . docusate sodium (COLACE) capsule 100 mg  100 mg Oral BID Marchia Bond,  MD   100 mg at 07/12/15 1019  . feeding supplement (ENSURE ENLIVE) (ENSURE ENLIVE) liquid 237 mL  237 mL Oral BID BM Oswald Hillock, MD      . ferrous sulfate tablet 325 mg  325 mg Oral TID PC Marchia Bond, MD   325 mg at 07/12/15 1206  . hydrALAZINE (APRESOLINE) injection 10 mg  10 mg Intravenous Q4H PRN Vianne Bulls, MD      . HYDROcodone-acetaminophen (NORCO/VICODIN) 5-325 MG per tablet 1-2 tablet  1-2 tablet Oral Q6H PRN Marchia Bond, MD   2 tablet at 07/12/15 [phone]  . losartan (COZAAR) tablet 50 mg  50 mg Oral BID Oswald Hillock, MD      . magnesium citrate solution 1 Bottle  1 Bottle Oral Once PRN Marchia Bond, MD      . menthol-cetylpyridinium (CEPACOL) lozenge 3 mg  1 lozenge Oral PRN Marchia Bond, MD       Or  . phenol (CHLORASEPTIC) mouth spray 1 spray  1 spray Mouth/Throat PRN Marchia Bond, MD      . morphine 2 MG/ML injection 2 mg  2 mg Intravenous Q2H PRN Marchia Bond, MD      . ondansetron [facility]) tablet 4 mg  4 mg Oral Q6H PRN Marchia Bond, MD       Or  . ondansetron [facility]) injection 4 mg  4 mg Intravenous Q6H PRN Marchia Bond, MD      . polyethylene glycol (MIRALAX / GLYCOLAX) packet 17 g  17 g Oral Daily PRN Marchia Bond, MD      . [facility] tablet 8.6 mg  1 tablet Oral BID Marchia Bond, MD   8.6 mg at 07/12/15 1020     Discharge Medications: Please see discharge summary for a list of discharge medications.  Relevant Imaging Results:  Relevant Lab Results:   Additional Information SSN [id number]  Ross Ludwig, [place]

## 2015-07-12 NOTE — Care Management Note (Signed)
Case Management Note  Patient Details  Name: Martin Conner MRN: [id number] Date of Birth: Feb 18, 1928  Subjective/Objective:             Admitted with right femoral neck fracture, s/p right total hip arthroplasty       Action/Plan: PT recommended SNF. Referral made to CSW, [place] working on SNF placement for short term rehab.  Expected Discharge Date:                  Expected Discharge Plan:  Skilled Nursing Facility  In-House Referral:  Clinical Social Work  Discharge planning Services  CM Consult  Post Acute Care Choice:    Choice offered to:     DME Arranged:    DME Agency:     HH Arranged:    [place] Agency:     Status of Service:  In process, will continue to follow  Medicare Important Message Given:    Date Medicare IM Given:    Medicare IM give by:    Date Additional Medicare IM Given:    Additional Medicare Important Message give by:     If discussed at [place] of Stay Meetings, dates discussed:    Additional Comments:  Nila Nephew, RN 07/12/2015, 1:38 PM

## 2015-07-12 NOTE — Progress Notes (Signed)
[facility] PROGRESS NOTE  Martin Conner [id number] DOB: 1927/04/28 DOA: 07/10/2015 PCP: Viviana Simpler, MD  Outpatient specialist:  Brief HPI:  82 y.o. male with medical history significant for chronic atrial fibrillation on prior DEXA, hypertension, and hyperlipidemia who presents to the ED with severe right hip pain and inability to ambulate following a mechanical ground-level fall the evening prior.Nondisplaced acute fracture at the base of the right femoral neck was noted on CT of the hip and orthopedic surgery was consulted by the EDP.  Principal Problem:   Fracture of femoral neck, right (HCC) Active Problems:   Hyperlipemia   Essential hypertension, benign   Chronic atrial fibrillation (HCC)   Normocytic anemia   Assessment/Plan: 1. Right femoral neck fracture- patient underwent right total hip arthroplasty. Pain control. Orthopedics following. 2. Chronic atrial fibrillation- heart rate is controlled. Continue Pradaxa for anticoagulation.Chads vasc  score is 3 3. Hypertension- will restart Cozaar 50 mg twice a day   DVT prophylaxis: SCD, Pradaxa Code Status: Full code Family Communication: No family at bedside Disposition Plan: SNF   Consultants:  Orthopedics   Procedures:  Total hip arthroplasty  Antibiotics:  Cefazolin 2 g IV q 6 hrs x 2 doses for surgical prophylaxis  Subjective: Patient is s/p right total hip arthroplasty. Denies any complaints this morning.  Objective: Filed Vitals:   07/11/15 1930 07/11/15 1935 07/11/15 1955 07/12/15 0045  BP: 126/77 126/77 155/69 110/61  Pulse: 76 83 76 93  Temp: 98 F (36.7 C)  97.8 F (36.6 C) 97.8 F (36.6 C)  TempSrc:   Oral Oral  Resp: 15 21 18 16   Weight:      SpO2: 99% 100% 99% 96%    Intake/Output Summary (Last 24 hours) at 07/12/15 1215 Last data filed at 07/12/15 1122  Gross per 24 hour  Intake 3677.5 ml  Output   1625 ml  Net 2052.5 ml   Filed Weights   07/10/15 1607  Weight: 72.122  kg (159 lb)    Examination:  General exam: Appears calm and comfortable  Respiratory system: Clear to auscultation. Respiratory effort normal. Cardiovascular system: S1 & S2 heard, RRR. No JVD, murmurs, rubs, gallops or clicks. No pedal edema. Gastrointestinal system: Abdomen is nondistended, soft and nontender. No organomegaly or masses felt. Normal bowel sounds heard. Central nervous system: Alert and oriented. No focal neurological deficits. Extremities: Symmetric 5 x 5 power. Skin: No rashes, lesions or ulcers Psychiatry: Judgement and insight appear normal. Mood & affect appropriate.    Data Reviewed: I have personally reviewed following labs and imaging studies Basic Metabolic Panel:  Recent Labs Lab 07/10/15 2235 07/12/15 0511  NA 137 137  K 3.9 4.6  CL 104 102  CO2 22 25  GLUCOSE 103* 119*  BUN 15 12  CREATININE 1.02 1.16  CALCIUM 9.0 8.6*   Liver Function Tests: No results for input(s): AST, ALT, ALKPHOS, BILITOT, PROT, ALBUMIN in the last 168 hours. No results for input(s): LIPASE, AMYLASE in the last 168 hours. No results for input(s): AMMONIA in the last 168 hours. CBC:  Recent Labs Lab 07/10/15 2235 07/12/15 0511  WBC 11.4* 8.8  NEUTROABS 8.5*  --   HGB 12.6* 9.6*  HCT 37.0* 29.2*  MCV 94.4 95.7  PLT 215 177   Cardiac Enzymes: No results for input(s): CKTOTAL, CKMB, CKMBINDEX, TROPONINI in the last 168 hours. BNP (last 3 results)  Recent Labs  05/13/15 1321  BNP 251.3*    ProBNP (last 3 results) No results for  input(s): PROBNP in the last 8760 hours.  CBG: No results for input(s): GLUCAP in the last 168 hours.  Recent Results (from the past 240 hour(s))  Surgical pcr screen     Status: None   Collection Time: 07/11/15  2:41 PM  Result Value Ref Range Status   MRSA, PCR NEGATIVE NEGATIVE Final   Staphylococcus aureus NEGATIVE NEGATIVE Final    Comment:        The Xpert SA Assay (FDA approved for NASAL specimens in patients over 54  years of age), is one component of a comprehensive surveillance program.  Test performance has been validated by [facility] for patients greater than or equal to 95 year old. It is not intended to diagnose infection nor to guide or monitor treatment.      Studies: Ct Hip Right Wo Contrast  07/10/2015  CLINICAL DATA:  Right hip pain and unable to bear weight after a fall yesterday. EXAM: CT OF THE RIGHT HIP WITHOUT CONTRAST TECHNIQUE: Multidetector CT imaging of the right hip was performed according to the standard protocol. Multiplanar CT image reconstructions were also generated. COMPARISON:  Right hip radiographs 07/10/2015 FINDINGS: Degenerative changes in the right hip with complete loss of joint space, sclerosis, degenerative cysts on both sides of the joint, and prominent osteophytes on both sides of the joint. Inter trochanteric linear lucency at the base of the femoral neck, extending to the greater trochanter consistent with comminuted and nondisplaced acute fracture. Lesser trochanter does not appear to be involved. No significant hematoma or soft tissue swelling. Visualized pelvic structures appear intact. IMPRESSION: Nondisplaced acute fracture of the base of the femoral neck at the inter trochanteric region with extension to the greater trochanter. Severe degenerative changes in the right hip. Electronically Signed   By: Lucienne Capers M.D.   On: 07/10/2015 23:03   Dg Chest Port 1 View  07/10/2015  CLINICAL DATA:  Preop hip surgery. EXAM: PORTABLE CHEST 1 VIEW COMPARISON:  05/12/2013 FINDINGS: The cardiomediastinal contours are unchanged, heart at the upper limits of normal in size. The lungs are clear. Pulmonary vasculature is normal. No consolidation, pleural effusion, or pneumothorax. No acute osseous abnormalities are seen. IMPRESSION: No acute process. Electronically Signed   By: Jeb Levering M.D.   On: 07/10/2015 23:33   Dg Hip Port Unilat With Pelvis 1v  Right  07/11/2015  CLINICAL DATA:  Post right hip total arthroplasty. EXAM: DG HIP (WITH OR WITHOUT PELVIS) 1V PORT RIGHT COMPARISON:  Preoperative radiographs and CT yesterday. FINDINGS: Right hip total arthroplasty in expected alignment. No periprosthetic lucency or fracture. Expected postsurgical change in the soft tissues. Left hip arthroplasty appears intact, distal stem not entirely included. IMPRESSION: Right hip arthroplasty without immediate postoperative complication. Electronically Signed   By: Jeb Levering M.D.   On: 07/11/2015 18:30   Dg Hip Unilat  With Pelvis 2-3 Views Right  07/10/2015  CLINICAL DATA:  Lateral RIGHT hip pain after falling last [place], unable to bear weight on RIGHT leg, limited range of motion RIGHT hip EXAM: DG HIP (WITH OR WITHOUT PELVIS) 2-3V RIGHT COMPARISON:  02/01/2008 FINDINGS: Diffuse osseous demineralization. Components of a LEFT hip prosthesis identified. Marked narrowing of RIGHT hip joint with minimal subchondral sclerosis, marginal spurs, bone on bone appearance and question subchondral cyst formation at femoral head. No definite acute fracture, dislocation or bone destruction. IMPRESSION: Progressive osteoarthritic changes RIGHT hip since 2009. Osseous demineralization. No definite acute bony abnormalities identified; if patient  has persistent pain, may consider followup MR imaging. Electronically Signed   By: Lavonia Dana M.D.   On: 07/10/2015 16:36   Dg Femur, Min 2 Views Right  07/10/2015  CLINICAL DATA:  Right hip pain since a fall yesterday. EXAM: RIGHT FEMUR 2 VIEWS COMPARISON:  None. FINDINGS: Diffuse bone demineralization. Severe degenerative changes in the right hip with near complete narrowing of the acetabular joint space, bone sclerosis and subcortical cysts, and prominent osteophyte formation. No evidence of acute fracture or dislocation in the right femur. Mild degenerative changes in the right knee. No effusion.  Vascular calcifications. Postoperative changes in the proximal tibia. IMPRESSION: Degenerative changes in the right hip.  No acute bony abnormalities. Electronically Signed   By: Lucienne Capers M.D.   On: 07/10/2015 21:48    Scheduled Meds: . dabigatran  150 mg Oral BID  . docusate sodium  100 mg Oral BID  . ferrous sulfate  325 mg Oral TID PC  . senna  1 tablet Oral BID   Continuous Infusions: . sodium chloride 75 mL/hr (07/11/15 2300)       Time spent: 25 min    [facility]  [facility] Pager [phone]*. If 7PM-7AM, please contact night-coverage at [email or web address], password [facility] 07/12/2015, 12:15 PM  LOS: 2 days

## 2015-07-12 NOTE — Clinical Social Work Note (Signed)
Clinical Social Work Assessment  Patient Details  Name: Martin Conner MRN: [id number] Date of Birth: [birth year]  Date of referral:  07/12/15               Reason for consult:  Facility Placement                Permission sought to share information with:  Family Supports, Customer service manager Permission granted to share information::  Yes, Verbal Permission Granted  Name::     Martin Conner Spouse [phone] or [phone]  Agency::  SNF admissions  Relationship::     Contact Information:     Housing/Transportation Living arrangements for the past 2 months:  Single Family Home Source of Information:  Patient, Spouse Patient Interpreter Needed:  None Criminal Activity/Legal Involvement Pertinent to Current Situation/Hospitalization:  No - Comment as needed Significant Relationships:  Spouse Lives with:  Spouse Do you feel safe going back to the place where you live?  No (Patient wants snf for short term rehab before going home.) Need for family participation in patient care:  No (Coment)  Care giving concerns:  Patient and family would like go to SNF for short term rehab.   Social Worker assessment / plan:  Patient is an 82 year old male who is married and lives with his wife in a house.  Patient is alert and oriented x4 and able to express his concerns or issues.  Patient states he has been to SNF for rehab before and is familiar with the process.  Patient was explained role of CSW in bed search process and what to expect day of discharge.  Patient was explained what to expect at SNF and what the process is from trying to get back home.  Patient states last time he was at SNF he was happy he did not have to be there too long.  Patient is motivated to work hard in order to get his strength back up so he can return back home.  Patient and his wife expressed they did not have any other questions.  Employment status:  Retired Nurse, adult PT  Recommendations:  [place] / Referral to community resources:  [place]  Patient/Family's Response to care:  Patient and family agreeable to going to SNF for short term rehab.  Patient/Family's Understanding of and Emotional Response to Diagnosis, Current Treatment, and Prognosis:  Patient is aware of current treatment plan and diagnosis.  Emotional Assessment Appearance:  Appears stated age Attitude/Demeanor/Rapport:    Affect (typically observed):  Appropriate, Calm, Pleasant Orientation:  Oriented to Self, Oriented to Place, Oriented to  Time, Oriented to Situation Alcohol / Substance use:  Not Applicable Psych involvement (Current and /or in the community):  No (Comment)  Discharge Needs  Concerns to be addressed:  Lack of Support Readmission within the last 30 days:  No Current discharge risk:  None Barriers to Discharge:  No Barriers Identified   Ross Ludwig, LCSWA 07/12/2015, 12:58 PM

## 2015-07-13 LAB — BASIC METABOLIC PANEL
ANION GAP: 10 (ref 5–15)
BUN: 16 mg/dL (ref 6–20)
BUN: 16 mg/dL (ref 4–21)
CHLORIDE: 102 mmol/L (ref 101–111)
CO2: 24 mmol/L (ref 22–32)
CREATININE: 1.17 mg/dL (ref 0.61–1.24)
Calcium: 8.5 mg/dL — ABNORMAL LOW (ref 8.9–10.3)
Creatinine: 1.2 mg/dL (ref 0.6–1.3)
GFR calc non Af Amer: 55 mL/min — ABNORMAL LOW (ref >60)
Glucose, Bld: 115 mg/dL — ABNORMAL HIGH (ref 65–99)
Glucose: 115 mg/dL
POTASSIUM: 4.1 mmol/L (ref 3.5–5.1)
SODIUM: 136 mmol/L (ref 135–145)
Sodium: 136 mmol/L — AB (ref 137–147)

## 2015-07-13 LAB — CBC
HEMATOCRIT: 27.2 % — AB (ref 39.0–52.0)
Hemoglobin: 9 g/dL — ABNORMAL LOW (ref 13.0–17.0)
MCH: 31.7 pg (ref 26.0–34.0)
MCHC: 33.1 g/dL (ref 30.0–36.0)
MCV: 95.8 fL (ref 78.0–100.0)
Platelets: 169 10*3/uL (ref 150–400)
RBC: 2.84 MIL/uL — AB (ref 4.22–5.81)
RDW: 14 % (ref 11.5–15.5)
WBC: 8.7 10*3/uL (ref 4.0–10.5)

## 2015-07-13 LAB — CBC AND DIFFERENTIAL: WBC: 8.7 10^3/mL

## 2015-07-13 MED ORDER — ENSURE ENLIVE PO LIQD: 237.0000 mL | Freq: Two times a day (BID) | ORAL | Status: DC

## 2015-07-13 MED ORDER — FERROUS SULFATE 325 (65 FE) MG PO TABS: 325.0000 mg | ORAL_TABLET | Freq: Three times a day (TID) | ORAL | Status: DC

## 2015-07-13 NOTE — Care Management Important Message (Signed)
Important Message  Patient Details  Name: Martin Conner MRN: [id number] Date of Birth: 1927-04-19   Medicare Important Message Given:  Yes    [name] P Sour [place] 07/13/2015, 1:33 PM

## 2015-07-13 NOTE — Discharge Instructions (Signed)
INSTRUCTIONS AFTER JOINT REPLACEMENT  ° °o Remove items at home which could result in a fall. This includes throw rugs or furniture in walking pathways °o ICE to the affected joint every three hours while awake for 30 minutes at a time, for at least the first 3-5 days, and then as needed for pain and swelling.  Continue to use ice for pain and swelling. You may notice swelling that will progress down to the foot and ankle.  This is normal after surgery.  Elevate your leg when you are not up walking on it.   °o Continue to use the breathing machine you got in the hospital (incentive spirometer) which will help keep your temperature down.  It is common for your temperature to cycle up and down following surgery, especially at night when you are not up moving around and exerting yourself.  The breathing machine keeps your lungs expanded and your temperature down. ° ° °DIET:  As you were doing prior to hospitalization, we recommend a well-balanced diet. ° °DRESSING / WOUND CARE / SHOWERING ° °You may change your dressing 3-5 days after surgery.  Then change the dressing every day with sterile gauze.  Please use good hand washing techniques before changing the dressing.  Do not use any lotions or creams on the incision until instructed by your surgeon. ° °ACTIVITY ° °o Increase activity slowly as tolerated, but follow the weight bearing instructions below.   °o No driving for 6 weeks or until further direction given by your physician.  You cannot drive while taking narcotics.  °o No lifting or carrying greater than 10 lbs. until further directed by your surgeon. °o Avoid periods of inactivity such as sitting longer than an hour when not asleep. This helps prevent blood clots.  °o You may return to work once you are authorized by your doctor.  ° ° ° °WEIGHT BEARING  ° °Weight bearing as tolerated with assist device (walker, cane, etc) as directed, use it as long as suggested by your surgeon or therapist, typically at  least 4-6 weeks. ° ° °EXERCISES ° °Results after joint replacement surgery are often greatly improved when you follow the exercise, range of motion and muscle strengthening exercises prescribed by your doctor. Safety measures are also important to protect the joint from further injury. Any time any of these exercises cause you to have increased pain or swelling, decrease what you are doing until you are comfortable again and then slowly increase them. If you have problems or questions, call your caregiver or physical therapist for advice.  ° °Rehabilitation is important following a joint replacement. After just a few days of immobilization, the muscles of the leg can become weakened and shrink (atrophy).  These exercises are designed to build up the tone and strength of the thigh and leg muscles and to improve motion. Often times heat used for twenty to thirty minutes before working out will loosen up your tissues and help with improving the range of motion but do not use heat for the first two weeks following surgery (sometimes heat can increase post-operative swelling).  ° °These exercises can be done on a training (exercise) mat, on the floor, on a table or on a bed. Use whatever works the best and is most comfortable for you.    Use music or television while you are exercising so that the exercises are a pleasant break in your day. This will make your life better with the exercises acting as a break   in your routine that you can look forward to.   Perform all exercises about fifteen times, three times per day or as directed.  You should exercise both the operative leg and the other leg as well. ° °Exercises include: °  °• Quad Sets - Tighten up the muscle on the front of the thigh (Quad) and hold for 5-10 seconds.   °• Straight Leg Raises - With your knee straight (if you were given a brace, keep it on), lift the leg to 60 degrees, hold for 3 seconds, and slowly lower the leg.  Perform this exercise against  resistance later as your leg gets stronger.  °• Leg Slides: Lying on your back, slowly slide your foot toward your buttocks, bending your knee up off the floor (only go as far as is comfortable). Then slowly slide your foot back down until your leg is flat on the floor again.  °• Angel Wings: Lying on your back spread your legs to the side as far apart as you can without causing discomfort.  °• Hamstring Strength:  Lying on your back, push your heel against the floor with your leg straight by tightening up the muscles of your buttocks.  Repeat, but this time bend your knee to a comfortable angle, and push your heel against the floor.  You may put a pillow under the heel to make it more comfortable if necessary.  ° °A rehabilitation program following joint replacement surgery can speed recovery and prevent re-injury in the future due to weakened muscles. Contact your doctor or a physical therapist for more information on knee rehabilitation.  ° ° °CONSTIPATION ° °Constipation is defined medically as fewer than three stools per week and severe constipation as less than one stool per week.  Even if you have a regular bowel pattern at home, your normal regimen is likely to be disrupted due to multiple reasons following surgery.  Combination of anesthesia, postoperative narcotics, change in appetite and fluid intake all can affect your bowels.  ° °YOU MUST use at least one of the following options; they are listed in order of increasing strength to get the job done.  They are all available over the counter, and you may need to use some, POSSIBLY even all of these options:   ° °Drink plenty of fluids (prune juice may be helpful) and high fiber foods °Colace 100 mg by mouth twice a day  °Senokot for constipation as directed and as needed Dulcolax (bisacodyl), take with full glass of water  °Miralax (polyethylene glycol) once or twice a day as needed. ° °If you have tried all these things and are unable to have a bowel  movement in the first 3-4 days after surgery call either your surgeon or your primary doctor.   ° °If you experience loose stools or diarrhea, hold the medications until you stool forms back up.  If your symptoms do not get better within 1 week or if they get worse, check with your doctor.  If you experience "the worst abdominal pain ever" or develop nausea or vomiting, please contact the office immediately for further recommendations for treatment. ° ° °ITCHING:  If you experience itching with your medications, try taking only a single pain pill, or even half a pain pill at a time.  You can also use Benadryl over the counter for itching or also to help with sleep.  ° °TED HOSE STOCKINGS:  Use stockings on both legs until for at least 2 weeks or as   directed by physician office. They may be removed at night for sleeping.  MEDICATIONS:  See your medication summary on the After Visit Summary that nursing will review with you.  You may have some home medications which will be placed on hold until you complete the course of blood thinner medication.  It is important for you to complete the blood thinner medication as prescribed.  PRECAUTIONS:  If you experience chest pain or shortness of breath - call 911 immediately for transfer to the hospital emergency department.   If you develop a fever greater that 101 F, purulent drainage from wound, increased redness or drainage from wound, foul odor from the wound/dressing, or calf pain - CONTACT YOUR SURGEON.                                                   FOLLOW-UP APPOINTMENTS:  If you do not already have a post-op appointment, please call the office for an appointment to be seen by your surgeon.  Guidelines for how soon to be seen are listed in your After Visit Summary, but are typically between 1-4 weeks after surgery.  OTHER INSTRUCTIONS:   Knee Replacement:  Do not place pillow under knee, focus on keeping the knee straight while resting. CPM  instructions: 0-90 degrees, 2 hours in the morning, 2 hours in the afternoon, and 2 hours in the evening. Place foam block, curve side up under heel at all times except when in CPM or when walking.  DO NOT modify, tear, cut, or change the foam block in any way.  MAKE SURE YOU:   Understand these instructions.   Get help right away if you are not doing well or get worse.    Thank you for letting us be a part of your medical care team.  It is a privilege we respect greatly.  We hope these instructions will help you stay on track for a fast and full recovery!   Information on my medicine - Pradaxa (dabigatran)  This medication education was reviewed with me or my healthcare representative as part of my discharge preparation.  The pharmacist that spoke with me during my hospital stay was:  Eudelia Bunch, [facility]  Why was Pradaxa prescribed for you? Pradaxa was prescribed for you to reduce the risk of forming blood clots that cause a stroke if you have a medical condition called atrial fibrillation (a type of irregular heartbeat).    What do you Need to know about PradAXa? Take your Pradaxa TWICE DAILY - one capsule in the morning and one tablet in the evening with or without food.  It would be best to take the doses about the same time each day.  The capsules should not be broken, chewed or opened - they must be swallowed whole.  Do not store Pradaxa in other medication containers - once the bottle is opened the Pradaxa should be used within FOUR months; throw away any capsules that havent been by that time.  Take Pradaxa exactly as prescribed by your doctor.  DO NOT stop taking Pradaxa without talking to the doctor who prescribed the medication.  Stopping without other stroke prevention medication to take the place of Pradaxa may increase your risk of developing a clot that causes a stroke.  Refill your prescription before you run out.  After discharge, you should  have regular check-up  appointments with your healthcare provider that is prescribing your Pradaxa.  In the future your dose may need to be changed if your kidney function or weight changes by a significant amount.  What do you do if you miss a dose? If you miss a dose, take it as soon as you remember on the same day.  If your next dose is less than 6 hours away, skip the missed dose.  Do not take two doses of PRADAXA at the same time.  Important Safety Information A possible side effect of Pradaxa is bleeding. You should call your healthcare provider right away if you experience any of the following: ? Bleeding from an injury or your nose that does not stop. ? Unusual colored urine (red or dark brown) or unusual colored stools (red or black). ? Unusual bruising for unknown reasons. ? A serious fall or if you hit your head (even if there is no bleeding).  Some medicines may interact with Pradaxa and might increase your risk of bleeding or clotting while on Pradaxa. To help avoid this, consult your healthcare provider or pharmacist prior to using any new prescription or non-prescription medications, including herbals, vitamins, non-steroidal anti-inflammatory drugs (NSAIDs) and supplements.  This website has more information on Pradaxa (dabigatran): [email or web address]   Information on my medicine - Pradaxa (dabigatran)  This medication education was reviewed with me or my healthcare representative as part of my discharge preparation.  The pharmacist that spoke with me during my hospital stay was:  Eudelia Bunch, [facility]  Why was Pradaxa prescribed for you? Pradaxa was prescribed for you to reduce the risk of forming blood clots that cause a stroke if you have a medical condition called atrial fibrillation (a type of irregular heartbeat).    What do you Need to know about PradAXa? Take your Pradaxa TWICE DAILY - one capsule in the morning and one tablet in the evening with or without food.  It would be  best to take the doses about the same time each day.  The capsules should not be broken, chewed or opened - they must be swallowed whole.  Do not store Pradaxa in other medication containers - once the bottle is opened the Pradaxa should be used within FOUR months; throw away any capsules that havent been by that time.  Take Pradaxa exactly as prescribed by your doctor.  DO NOT stop taking Pradaxa without talking to the doctor who prescribed the medication.  Stopping without other stroke prevention medication to take the place of Pradaxa may increase your risk of developing a clot that causes a stroke.  Refill your prescription before you run out.  After discharge, you should have regular check-up appointments with your healthcare provider that is prescribing your Pradaxa.  In the future your dose may need to be changed if your kidney function or weight changes by a significant amount.  What do you do if you miss a dose? If you miss a dose, take it as soon as you remember on the same day.  If your next dose is less than 6 hours away, skip the missed dose.  Do not take two doses of PRADAXA at the same time.  Important Safety Information A possible side effect of Pradaxa is bleeding. You should call your healthcare provider right away if you experience any of the following: ? Bleeding from an injury or your nose that does not stop. ? Unusual colored urine (red or dark brown) or unusual  colored stools (red or black). ? Unusual bruising for unknown reasons. ? A serious fall or if you hit your head (even if there is no bleeding).  Some medicines may interact with Pradaxa and might increase your risk of bleeding or clotting while on Pradaxa. To help avoid this, consult your healthcare provider or pharmacist prior to using any new prescription or non-prescription medications, including herbals, vitamins, non-steroidal anti-inflammatory drugs (NSAIDs) and supplements.  This website has more  information on Pradaxa (dabigatran): [email or web address]://[email or web address]

## 2015-07-13 NOTE — Discharge Summary (Signed)
Physician Discharge Summary  Martin Conner [id number] DOB: 03-20-1928 DOA: 07/10/2015  PCP: Viviana Simpler, MD  Admit date: 07/10/2015 Discharge date: 07/13/2015  Time spent: *25 minutes  Recommendations for Outpatient Follow-up:  1. Follow up Dr Mardelle Matte in 2 weeks   Discharge Diagnoses:  Principal Problem:   Fracture of femoral neck, right ([place]) Active Problems:   Hyperlipemia   Essential hypertension, benign   Chronic atrial fibrillation (HCC)   Normocytic anemia   Discharge Condition: Stable  Diet recommendation:  Low salt diet  Filed Weights   07/10/15 1607  Weight: 72.122 kg (159 lb)    History of present illness:  82 y.o. male with medical history significant for chronic atrial fibrillation on prior DEXA, hypertension, and hyperlipidemia who presents to the ED with severe right hip pain and inability to ambulate following a mechanical ground-level fall the evening prior.Nondisplaced acute fracture at the base of the right femoral neck was noted on CT of the hip and orthopedic surgery was consulted by the EDP.   Hospital Course:  1. Right femoral neck fracture- patient underwent right total hip arthroplasty. Pain control. Patient will be discharged to SNF today, WBAT, dry dressing as needed. 2. Chronic atrial fibrillation- heart rate is controlled. Continue Pradaxa for anticoagulation.Chads vasc score is 3 3. Hypertension- will restart Cozaar 50 mg twice a day. 4. Acute blood loss anemia- hemoglobin is 9.0, continue ferrous sulfate 325 mg po tid. Continue to monitor the cbc.  Procedures: Total hip arthroplasty  Consultations:  Orthopedics   Discharge Exam: Filed Vitals:   07/12/15 2013 07/13/15 0508  BP: 113/59 95/52  Pulse: 76 90  Temp: 98.4 F (36.9 C) 98.3 F (36.8 C)  Resp: 16 16    General: Appears in no acute distress Cardiovascular: S1S2 RRR Respiratory: Clear bilaterally  Discharge Instructions   Discharge Instructions    Diet - low sodium  heart healthy    Complete by:  As directed      Increase activity slowly    Complete by:  As directed      Posterior total hip precautions    Complete by:  As directed      Weight bearing as tolerated    Complete by:  As directed           Current Discharge Medication List    START taking these medications   Details  baclofen (LIORESAL) 10 MG tablet Take 1 tablet (10 mg total) by mouth 3 (three) times daily. As needed for muscle spasm Qty: 50 tablet, Refills: 0    feeding supplement, ENSURE ENLIVE, (ENSURE ENLIVE) LIQD Take 237 mLs by mouth 2 (two) times daily between meals. Qty: 237 mL, Refills: 12    ferrous sulfate 325 (65 FE) MG tablet Take 1 tablet (325 mg total) by mouth 3 (three) times daily after meals. Refills: 3    HYDROcodone-acetaminophen (NORCO) 5-325 MG tablet Take 1-2 tablets by mouth every 6 (six) hours as needed for moderate pain. MAXIMUM TOTAL ACETAMINOPHEN DOSE IS 4000 MG PER DAY Qty: 50 tablet, Refills: 0    sennosides-docusate sodium (SENOKOT-S) 8.6-50 MG tablet Take 2 tablets by mouth daily. Qty: 30 tablet, Refills: 1      CONTINUE these medications which have NOT CHANGED   Details  losartan (COZAAR) 100 MG tablet Take 50 mg by mouth 2 (two) times daily.     PRADAXA 110 MG CAPS Take 110 mg by mouth 2 (two) times daily.      STOP taking these medications  naproxen sodium (ANAPROX) 220 MG tablet        No Known Allergies Follow-up Information    Follow up with LANDAU,JOSHUA P, MD. Schedule an appointment as soon as possible for a visit in 2 weeks.   Specialty:  Orthopedic Surgery   Contact information:   [address] [address] [phone]        The results of significant diagnostics from this hospitalization (including imaging, microbiology, ancillary and laboratory) are listed below for reference.    Significant Diagnostic Studies: Ct Hip Right Wo Contrast  07/10/2015  CLINICAL DATA:  Right hip pain  and unable to bear weight after a fall yesterday. EXAM: CT OF THE RIGHT HIP WITHOUT CONTRAST TECHNIQUE: Multidetector CT imaging of the right hip was performed according to the standard protocol. Multiplanar CT image reconstructions were also generated. COMPARISON:  Right hip radiographs 07/10/2015 FINDINGS: Degenerative changes in the right hip with complete loss of joint space, sclerosis, degenerative cysts on both sides of the joint, and prominent osteophytes on both sides of the joint. Inter trochanteric linear lucency at the base of the femoral neck, extending to the greater trochanter consistent with comminuted and nondisplaced acute fracture. Lesser trochanter does not appear to be involved. No significant hematoma or soft tissue swelling. Visualized pelvic structures appear intact. IMPRESSION: Nondisplaced acute fracture of the base of the femoral neck at the inter trochanteric region with extension to the greater trochanter. Severe degenerative changes in the right hip. Electronically Signed   By: Lucienne Capers M.D.   On: 07/10/2015 23:03   Dg Chest Port 1 View  07/10/2015  CLINICAL DATA:  Preop hip surgery. EXAM: PORTABLE CHEST 1 VIEW COMPARISON:  05/12/2013 FINDINGS: The cardiomediastinal contours are unchanged, heart at the upper limits of normal in size. The lungs are clear. Pulmonary vasculature is normal. No consolidation, pleural effusion, or pneumothorax. No acute osseous abnormalities are seen. IMPRESSION: No acute process. Electronically Signed   By: Jeb Levering M.D.   On: 07/10/2015 23:33   Dg Hip Port Unilat With Pelvis 1v Right  07/11/2015  CLINICAL DATA:  Post right hip total arthroplasty. EXAM: DG HIP (WITH OR WITHOUT PELVIS) 1V PORT RIGHT COMPARISON:  Preoperative radiographs and CT yesterday. FINDINGS: Right hip total arthroplasty in expected alignment. No periprosthetic lucency or fracture. Expected postsurgical change in the soft tissues. Left hip arthroplasty appears  intact, distal stem not entirely included. IMPRESSION: Right hip arthroplasty without immediate postoperative complication. Electronically Signed   By: Jeb Levering M.D.   On: 07/11/2015 18:30   Dg Hip Unilat  With Pelvis 2-3 Views Right  07/10/2015  CLINICAL DATA:  Lateral RIGHT hip pain after [place], unable to bear weight on RIGHT leg, limited range of motion RIGHT hip EXAM: DG HIP (WITH OR WITHOUT PELVIS) 2-3V RIGHT COMPARISON:  02/01/2008 FINDINGS: Diffuse osseous demineralization. Components of a LEFT hip prosthesis identified. Marked narrowing of RIGHT hip joint with minimal subchondral sclerosis, marginal spurs, bone on bone appearance and question subchondral cyst formation at femoral head. No definite acute fracture, dislocation or bone destruction. IMPRESSION: Progressive osteoarthritic changes RIGHT hip since 2009. Osseous demineralization. No definite acute bony abnormalities identified; if patient has persistent pain, may consider followup MR imaging. Electronically Signed   By: Lavonia Dana M.D.   On: 07/10/2015 16:36   Dg Femur, Min 2 Views Right  07/10/2015  CLINICAL DATA:  Right hip pain since a fall yesterday. EXAM:  RIGHT FEMUR 2 VIEWS COMPARISON:  None. FINDINGS: Diffuse bone demineralization. Severe degenerative changes in the right hip with near complete narrowing of the acetabular joint space, bone sclerosis and subcortical cysts, and prominent osteophyte formation. No evidence of acute fracture or dislocation in the right femur. Mild degenerative changes in the right knee. No effusion. Vascular calcifications. Postoperative changes in the proximal tibia. IMPRESSION: Degenerative changes in the right hip.  No acute bony abnormalities. Electronically Signed   By: Lucienne Capers M.D.   On: 07/10/2015 21:48    Microbiology: Recent Results (from the past 240 hour(s))  Surgical pcr screen     Status: None   Collection Time: 07/11/15  2:41 PM   Result Value Ref Range Status   MRSA, PCR NEGATIVE NEGATIVE Final   Staphylococcus aureus NEGATIVE NEGATIVE Final    Comment:        The Xpert SA Assay (FDA approved for NASAL specimens in patients over 79 years of age), is one component of a comprehensive surveillance program.  Test performance has been validated by [facility] for patients greater than or equal to 80 year old. It is not intended to diagnose infection nor to guide or monitor treatment.      Labs: Basic Metabolic Panel:  Recent Labs Lab 07/10/15 2235 07/12/15 0511 07/13/15 0521  NA 137 137 136  K 3.9 4.6 4.1  CL 104 102 102  CO2 22 25 24   GLUCOSE 103* 119* 115*  BUN 15 12 16   CREATININE 1.02 1.16 1.17  CALCIUM 9.0 8.6* 8.5*   Liver Function Tests: No results for input(s): AST, ALT, ALKPHOS, BILITOT, PROT, ALBUMIN in the last 168 hours. No results for input(s): LIPASE, AMYLASE in the last 168 hours. No results for input(s): AMMONIA in the last 168 hours. CBC:  Recent Labs Lab 07/10/15 2235 07/12/15 0511 07/13/15 0521  WBC 11.4* 8.8 8.7  NEUTROABS 8.5*  --   --   HGB 12.6* 9.6* 9.0*  HCT 37.0* 29.2* 27.2*  MCV 94.4 95.7 95.8  PLT 215 177 169   Cardiac Enzymes: No results for input(s): CKTOTAL, CKMB, CKMBINDEX, TROPONINI in the last 168 hours. BNP: BNP (last 3 results)  Recent Labs  05/13/15 1321  BNP 251.3*       Signed:  Eleonore Chiquito S MD.  [facility] 07/13/2015, 11:57 AM

## 2015-07-13 NOTE — Progress Notes (Signed)
Report gave to [facility] LPN at [facility] . Pt's pain managed well, IV out without complication.

## 2015-07-13 NOTE — Progress Notes (Signed)
Patient ID: [facility], male   DOB: 04-24-1927, 82 y.o.   MRN: [id number]     Subjective:  Patient reports pain as mild.  Patient states that he is much better.  He denies any CP or SOB  Objective:   VITALS:   Filed Vitals:   07/12/15 0045 07/12/15 1300 07/12/15 2013 07/13/15 0508  BP: 110/61 118/51 113/59 95/52  Pulse: 93 92 76 90  Temp: 97.8 F (36.6 C) 98.7 F (37.1 C) 98.4 F (36.9 C) 98.3 F (36.8 C)  TempSrc: Oral  Oral Oral  Resp: 16 18 16 16   Weight:      SpO2: 96%  97% 92%    ABD soft Sensation intact distally Dorsiflexion/Plantar flexion intact Incision: dressing C/D/I and no drainage Wound good and no sign of infection   Lab Results  Component Value Date   WBC 8.7 07/13/2015   HGB 9.0* 07/13/2015   HCT 27.2* 07/13/2015   MCV 95.8 07/13/2015   PLT 169 07/13/2015   BMET    Component Value Date/Time   NA 136 07/13/2015 0521   K 4.1 07/13/2015 0521   CL 102 07/13/2015 0521   CO2 24 07/13/2015 0521   GLUCOSE 115* 07/13/2015 0521   BUN 16 07/13/2015 0521   CREATININE 1.17 07/13/2015 0521   CREATININE 1.20* 06/06/2015 1003   CALCIUM 8.5* 07/13/2015 0521   GFRNONAA 55* 07/13/2015 0521   GFRAA >60 07/13/2015 0521     Assessment/Plan: 2 Days Post-Op   Principal Problem:   Fracture of femoral neck, right (HCC) Active Problems:   Hyperlipemia   Essential hypertension, benign   Chronic atrial fibrillation (HCC)   Normocytic anemia   Advance diet Up with therapy Continue plan per medicine Okay for SNF as far as his hip goes WBAT Dry dressing PRN    Rande Brunt, Aviyana Sonntag 07/13/2015, 11:15 AM   Marchia Bond, MD Cell [phone]

## 2015-07-13 NOTE — Clinical Social Work Note (Signed)
Patient to be d/c'ed today to [facility].  Patient and family agreeable to plans will transport via ems RN to call report to [phone].  Oreta Soloway, MSW, LCSWA [phone]  

## 2015-07-16 ENCOUNTER — Encounter: Payer: Self-pay | Admitting: Internal Medicine

## 2015-07-16 ENCOUNTER — Non-Acute Institutional Stay (SKILLED_NURSING_FACILITY): Payer: Medicare Other | Admitting: Internal Medicine

## 2015-07-16 DIAGNOSIS — I10 Essential (primary) hypertension: Secondary | ICD-10-CM | POA: Diagnosis not present

## 2015-07-16 DIAGNOSIS — K5901 Slow transit constipation: Secondary | ICD-10-CM | POA: Diagnosis not present

## 2015-07-16 DIAGNOSIS — R2681 Unsteadiness on feet: Secondary | ICD-10-CM | POA: Diagnosis not present

## 2015-07-16 DIAGNOSIS — S72001S Fracture of unspecified part of neck of right femur, sequela: Secondary | ICD-10-CM | POA: Diagnosis not present

## 2015-07-16 DIAGNOSIS — S51012D Laceration without foreign body of left elbow, subsequent encounter: Secondary | ICD-10-CM

## 2015-07-16 DIAGNOSIS — I482 Chronic atrial fibrillation, unspecified: Secondary | ICD-10-CM

## 2015-07-16 DIAGNOSIS — S51002D Unspecified open wound of left elbow, subsequent encounter: Secondary | ICD-10-CM

## 2015-07-16 DIAGNOSIS — D62 Acute posthemorrhagic anemia: Secondary | ICD-10-CM

## 2015-07-16 NOTE — Progress Notes (Signed)
LOCATION: [facility]  PCP: Viviana Simpler, MD   Code Status: Full Code  Goals of care: Advanced Directive information Advanced Directives 07/10/2015  Does patient have an advance directive? No  Type of Advance Directive -  Does patient want to make changes to advanced directive? -  Copy of advanced directive(s) in chart? -       Extended Emergency Contact Information Primary Emergency Contact: Monarrez,[name] Address: [address]          [place] [address] [place] Phone: [phone] Mobile Phone: [phone] Relation: Spouse Secondary Emergency Contact: Schomburg,[name]  [place] Phone: [phone] Relation: Daughter   No Known Allergies  Chief Complaint  Patient presents with  . New Admit To SNF    New Admission     HPI:  Patient is a 82 y.o. male seen today for short term rehabilitation post hospital admission from 07/10/15-07/13/15 with right femoral neck fracture. He underwent right total hip arthroplasty. He is seen in his room with his wife present. His pain is under control with current pain regimen.   Review of Systems:  Constitutional: Negative for fever, chills, diaphoresis. Energy level is slowly coming back.  HENT: Negative for headache, congestion, nasal discharge, sore throat, difficulty swallowing.   Eyes: Negative for blurred vision, double vision and discharge.  Respiratory: Negative for cough, shortness of breath and wheezing.   Cardiovascular: Negative for chest pain, palpitations, leg swelling.  Gastrointestinal: Negative for heartburn, nausea, vomiting, abdominal pain, loss of appetite, melena, diarrhea and constipation. Last bowel movement was yesterday after several days.  Genitourinary: Negative for dysuria and flank pain.  Musculoskeletal: Negative for back pain, fall in the facility.  Skin: Negative for itching, rash.  Neurological: Negative for dizziness. Psychiatric/Behavioral: Negative for  depression   Past Medical History  Diagnosis Date  . Hyperlipidemia   . Hypertensive heart disease   . Lumbar disc disease   . Osteoarthritis   . Chronic atrial fibrillation ([place])     a. 07/2012 Echo: EF 55-60%, no rwma, mildly dil RA/LA.  Marland Kitchen Syncope     a. 07/2012 - unclear etiology-->resulted in right tib/fib fx req ORIF/intramedullary nail of tibia.  . Fracture of femoral neck, right ([place]) 07/10/2015   Past Surgical History  Procedure Laterality Date  . Total hip arthroplasty      left  . Ankle surgery      right ankle  . Tibia im nail insertion Right 07/21/2012    Procedure: INTRAMEDULLARY (IM) NAIL TIBIAL;  Surgeon: Johnny Bridge, MD;  Location: [facility];  Service: Orthopedics;  Laterality: Right;  . Total hip arthroplasty Right 07/11/2015    Procedure: TOTAL HIP ARTHROPLASTY;  Surgeon: Marchia Bond, MD;  Location: [facility];  Service: Orthopedics;  Laterality: Right;   Social History:   reports that he has never smoked. He has never used smokeless tobacco. He reports that he drinks about 7.2 oz of alcohol per week. He reports that he does not use illicit drugs.  Family History  Problem Relation Age of Onset  . Cancer Mother   . Hyperlipidemia Sister   . Hypertension Sister     Medications:   Medication List       This list is accurate as of: 07/16/15  3:08 PM.  Always use your most recent med list.               baclofen 10 MG tablet  Commonly known as:  LIORESAL  Take  1 tablet (10 mg total) by mouth 3 (three) times daily. As needed for muscle spasm     ferrous sulfate 325 (65 FE) MG tablet  Take 1 tablet (325 mg total) by mouth 3 (three) times daily after meals.     HYDROcodone-acetaminophen 5-325 MG tablet  Commonly known as:  NORCO  Take 1-2 tablets by mouth every 6 (six) hours as needed for moderate pain. MAXIMUM TOTAL ACETAMINOPHEN DOSE IS 4000 MG PER DAY     losartan 100 MG tablet  Commonly known as:  COZAAR  Take 50 mg by mouth 2 (two) times daily.      dabigatran 75 MG Caps capsule  Commonly known as:  PRADAXA  Take 75 mg by mouth 2 (two) times daily. Until 100 mg arrives. Then D/C 75 mg     PRADAXA 110 MG Caps  Generic drug:  Dabigatran Etexilate Mesylate  Take 110 mg by mouth 2 (two) times daily.     sennosides-docusate sodium 8.6-50 MG tablet  Commonly known as:  SENOKOT-S  Take 2 tablets by mouth daily.     UNABLE TO FIND  Med Name: Med pass 120 mL by mouth twice daily between meals as supplement        Immunizations: Immunization History  Administered Date(s) Administered  . H1N1 05/26/2008  . Influenza Split 12/25/2010  . Influenza Whole 12/24/2009  . Influenza,inj,Quad PF,36+ Mos 02/01/2013, 01/10/2015  . Influenza-Unspecified 12/29/2013  . Pneumococcal Conjugate-13 02/03/2014  . Pneumococcal Polysaccharide-23 05/23/1998  . Td 03/24/2002, 02/01/2013     Physical Exam: Filed Vitals:   07/16/15 1501  BP: 146/90  Pulse: 92  Temp: 98.3 F (36.8 C)  TempSrc: Oral  Resp: 20  Height: 5' 10.5" (1.791 m)  Weight: 150 lb (68.04 kg)  SpO2: 93%   Body mass index is 21.21 kg/(m^2).  General- elderly male, well built, in no acute distress Head- normocephalic, atraumatic Nose- no maxillary or frontal sinus tenderness, no nasal discharge Throat- moist mucus membrane Eyes- PERRLA, EOMI, no pallor, no icterus, no discharge, normal conjunctiva, normal sclera Neck- no cervical lymphadenopathy Cardiovascular- irregular heart rate, no murmur, trace left and 1+ right leg edema Respiratory- bilateral clear to auscultation, no wheeze, no rhonchi, no crackles, no use of accessory muscles Abdomen- bowel sounds present, soft, non tender Musculoskeletal- able to move all 4 extremities, generalized weakness, limited right hip ROM Neurological-  alert and oriented to person, place and time Skin- warm and dry, right hip surgical incision with dressing in place, left elbow skin tear Psychiatry- normal mood and affect    Labs  reviewed: Basic Metabolic Panel:  Recent Labs  05/09/15 1137  05/13/15 1815  06/26/15 0931 07/10/15 2235 07/12/15 0511 07/13/15 07/13/15 0521  NA 136  < >  --   < > 135 137 137 136* 136  K 4.8  < >  --   < > 4.1 3.9 4.6  --  4.1  CL 100  < >  --   < > 99 104 102  --  102  CO2 26  < >  --   < > 26 22 25   --  24  GLUCOSE 94  < >  --   < > 112* 103* 119*  --  115*  BUN 34*  < >  --   < > 31* 15 12 16 16   CREATININE 1.58*  < >  --   < > 1.49 1.02 1.16 1.2 1.17  CALCIUM 10.3  < >  --   < >  10.1 9.0 8.6*  --  8.5*  MG  --   --  1.9  --   --   --   --   --   --   PHOS 3.5  --   --   --  3.3  --   --   --   --   < > = values in this interval not displayed. Liver Function Tests:  Recent Labs  01/10/15 0933 05/09/15 1137 05/13/15 1341 06/26/15 0931  AST 21  --  23  --   ALT 15  --  16*  --   ALKPHOS 75  --  49  --   BILITOT 1.0  --  1.0  --   PROT 7.8  --  6.2*  --   ALBUMIN 4.0 4.4 3.0* 4.3    CBC:  Recent Labs  05/13/15 1341  06/26/15 0931 07/10/15 2235 07/12/15 0511 07/13/15 07/13/15 0521  WBC 10.6*  < > 9.5 11.4* 8.8 8.7 8.7  NEUTROABS 7.8*  --  6.5 8.5*  --   --   --   HGB 14.6  < > 15.4 12.6* 9.6*  --  9.0*  HCT 41.4  < > 45.5 37.0* 29.2*  --  27.2*  MCV 93.0  < > 96.9 94.4 95.7  --  95.8  PLT 230  < > 262.0 215 177  --  169  < > = values in this interval not displayed. Cardiac Enzymes:  Recent Labs  05/13/15 1815 05/13/15 2334 05/14/15 0536  TROPONINI 0.07* 0.05* 0.03    Radiological Exams: Ct Hip Right Wo Contrast  07/10/2015  CLINICAL DATA:  Right hip pain and unable to bear weight after a fall yesterday. EXAM: CT OF THE RIGHT HIP WITHOUT CONTRAST TECHNIQUE: Multidetector CT imaging of the right hip was performed according to the standard protocol. Multiplanar CT image reconstructions were also generated. COMPARISON:  Right hip radiographs 07/10/2015 FINDINGS: Degenerative changes in the right hip with complete loss of joint space, sclerosis,  degenerative cysts on both sides of the joint, and prominent osteophytes on both sides of the joint. Inter trochanteric linear lucency at the base of the femoral neck, extending to the greater trochanter consistent with comminuted and nondisplaced acute fracture. Lesser trochanter does not appear to be involved. No significant hematoma or soft tissue swelling. Visualized pelvic structures appear intact. IMPRESSION: Nondisplaced acute fracture of the base of the femoral neck at the inter trochanteric region with extension to the greater trochanter. Severe degenerative changes in the right hip. Electronically Signed   By: Lucienne Capers M.D.   On: 07/10/2015 23:03   Dg Chest Port 1 View  07/10/2015  CLINICAL DATA:  Preop hip surgery. EXAM: PORTABLE CHEST 1 VIEW COMPARISON:  05/12/2013 FINDINGS: The cardiomediastinal contours are unchanged, heart at the upper limits of normal in size. The lungs are clear. Pulmonary vasculature is normal. No consolidation, pleural effusion, or pneumothorax. No acute osseous abnormalities are seen. IMPRESSION: No acute process. Electronically Signed   By: Jeb Levering M.D.   On: 07/10/2015 23:33   Dg Hip Port Unilat With Pelvis 1v Right  07/11/2015  CLINICAL DATA:  Post right hip total arthroplasty. EXAM: DG HIP (WITH OR WITHOUT PELVIS) 1V PORT RIGHT COMPARISON:  Preoperative radiographs and CT yesterday. FINDINGS: Right hip total arthroplasty in expected alignment. No periprosthetic lucency or fracture. Expected postsurgical change in the soft tissues. Left hip arthroplasty appears intact, distal stem not entirely included. IMPRESSION: Right hip arthroplasty without immediate  postoperative complication. Electronically Signed   By: Jeb Levering M.D.   On: 07/11/2015 18:30   Dg Hip Unilat  With Pelvis 2-3 Views Right  07/10/2015  CLINICAL DATA:  Lateral RIGHT hip pain after [place], unable to bear weight on RIGHT leg, limited  range of motion RIGHT hip EXAM: DG HIP (WITH OR WITHOUT PELVIS) 2-3V RIGHT COMPARISON:  02/01/2008 FINDINGS: Diffuse osseous demineralization. Components of a LEFT hip prosthesis identified. Marked narrowing of RIGHT hip joint with minimal subchondral sclerosis, marginal spurs, bone on bone appearance and question subchondral cyst formation at femoral head. No definite acute fracture, dislocation or bone destruction. IMPRESSION: Progressive osteoarthritic changes RIGHT hip since 2009. Osseous demineralization. No definite acute bony abnormalities identified; if patient has persistent pain, may consider followup MR imaging. Electronically Signed   By: Lavonia Dana M.D.   On: 07/10/2015 16:36   Dg Femur, Min 2 Views Right  07/10/2015  CLINICAL DATA:  Right hip pain since a fall yesterday. EXAM: RIGHT FEMUR 2 VIEWS COMPARISON:  None. FINDINGS: Diffuse bone demineralization. Severe degenerative changes in the right hip with near complete narrowing of the acetabular joint space, bone sclerosis and subcortical cysts, and prominent osteophyte formation. No evidence of acute fracture or dislocation in the right femur. Mild degenerative changes in the right knee. No effusion. Vascular calcifications. Postoperative changes in the proximal tibia. IMPRESSION: Degenerative changes in the right hip.  No acute bony abnormalities. Electronically Signed   By: Lucienne Capers M.D.   On: 07/10/2015 21:48    Assessment/Plan  Unsteady gait Will have patient work with PT/OT as tolerated to regain strength and restore function.  Fall precautions are in place.  Right femoral neck fracture S/p right hip arthroplasty. Has follow up with orthopedics. Continue norco 5-325 mg 1-2 tab q6h prn pain and baclofen 10 mg tid prn muscle spasm. Will have him work with physical therapy and occupational therapy team to help with gait training and muscle strengthening exercises.fall precautions. Skin care. Encourage to be out of bed. Continue  pradaxa for dvt prophylaxis. WBAT.   Acute blood loss anemia Post op, monitor cbc, continue feso4 325 mg tid  Constipation Stable, continue senokot s 2 tab qhs  afib Rate controlled. Continue to monitor. Continue pradaxa for anticoagulation  HTN Elevated bp reading, check bp bid x 1week. Continue losartan 100 mg daily  Left elbow skin tear Stable, continue skin care     Goals of care: short term rehabilitation   Labs/tests ordered: cbc, bmp 07/18/15 Family/ staff Communication: reviewed care plan with patient and nursing supervisor    Blanchie Serve, MD Internal Medicine [place], [place] [address] Cell Phone (Monday-Friday 8 am - 5 pm): [phone] On Call: [phone] and follow prompts after 5 pm and on weekends Office Phone: [phone] Office Fax: [phone]

## 2015-07-18 ENCOUNTER — Non-Acute Institutional Stay (SKILLED_NURSING_FACILITY): Payer: Medicare Other | Admitting: Family

## 2015-07-18 ENCOUNTER — Encounter: Payer: Self-pay | Admitting: Family

## 2015-07-18 DIAGNOSIS — K5901 Slow transit constipation: Secondary | ICD-10-CM

## 2015-07-18 DIAGNOSIS — R600 Localized edema: Secondary | ICD-10-CM

## 2015-07-18 DIAGNOSIS — G4700 Insomnia, unspecified: Secondary | ICD-10-CM | POA: Diagnosis not present

## 2015-07-18 LAB — CBC AND DIFFERENTIAL
HCT: 28 % — AB (ref 41–53)
HEMOGLOBIN: 8.9 g/dL — AB (ref 13.5–17.5)
PLATELETS: 365 10*3/uL (ref 150–399)
WBC: 2.7 10*3/mL

## 2015-07-18 LAB — BASIC METABOLIC PANEL
BUN: 17 mg/dL (ref 4–21)
Creatinine: 0.9 mg/dL (ref 0.6–1.3)
GLUCOSE: 101 mg/dL
POTASSIUM: 4.4 mmol/L (ref 3.4–5.3)
Sodium: 140 mmol/L (ref 137–147)

## 2015-07-18 MED ORDER — MELATONIN 3 MG PO TABS: 3.0000 mg | ORAL_TABLET | Freq: Every day | ORAL | Status: DC

## 2015-07-18 MED ORDER — BISACODYL 10 MG RE SUPP: 10.0000 mg | Freq: Once | RECTAL | Status: DC

## 2015-07-18 MED ORDER — POLYETHYLENE GLYCOL 3350 17 GM/SCOOP PO POWD: 17.0000 g | Freq: Every day | ORAL | Status: DC

## 2015-07-18 NOTE — Progress Notes (Signed)
Location:  [place] Room Number: [place]:  SNF [phone]) Provider:  Marlowe Sax, FNP-C  Viviana Simpler, MD  Patient Care Team: Venia Carbon, MD as PCP - General  Extended Emergency Contact Information Primary Emergency Contact: Lufkin,[name] Address: [address]          [place] [address] Johnnette Litter of [place] Phone: [phone] Mobile Phone: [phone] Relation: Spouse Secondary Emergency Contact: Garland States of [place] Phone: [phone] Relation: Daughter  Code Status:  Full Code  Goals of care: Advanced Directive information Advanced Directives 07/10/2015  Does patient have an advance directive? No  Type of Advance Directive -  Does patient want to make changes to advanced directive? -  Copy of advanced directive(s) in chart? -     Chief Complaint  Patient presents with  . Acute Visit    Acute Concerns    HPI:  Pt is a 82 y.o. male seen today at [facility]  for an acute visit for complains of insomnia. He has a medical history of HTN, Hyperlipidemia, OA, Chronic Afib among others. He is seen in his room today. He states left hip pain under control with current regimen though states has not been able to sleep at night. He states would like to take something mild to help him sleep. Of note he is status post hospital admission from 07/10/15-07/13/15 with right femoral neck fracture. He underwent right total hip arthroplasty.He states right is swollen and feels tight. He denies any tenderness, fever, chills or cough or shortness of breath.    Past Medical History  Diagnosis Date  . Hyperlipidemia   . Hypertensive heart disease   . Lumbar disc disease   . Osteoarthritis   . Chronic atrial fibrillation ([place])     a. 07/2012 Echo: EF 55-60%, no rwma, mildly dil RA/LA.  Marland Kitchen Syncope     a. 07/2012 - unclear etiology-->resulted in right tib/fib fx req ORIF/intramedullary nail  of tibia.  . Fracture of femoral neck, right ([place]) 07/10/2015   Past Surgical History  Procedure Laterality Date  . Total hip arthroplasty      left  . Ankle surgery      right ankle  . Tibia im nail insertion Right 07/21/2012    Procedure: INTRAMEDULLARY (IM) NAIL TIBIAL;  Surgeon: Johnny Bridge, MD;  Location: [place];  Service: Orthopedics;  Laterality: Right;  . Total hip arthroplasty Right 07/11/2015    Procedure: TOTAL HIP ARTHROPLASTY;  Surgeon: Marchia Bond, MD;  Location: [place];  Service: Orthopedics;  Laterality: Right;    No Known Allergies    Medication List       This list is accurate as of: 07/18/15 10:57 AM.  Always use your most recent med list.               baclofen 10 MG tablet  Commonly known as:  LIORESAL  Take 1 tablet (10 mg total) by mouth 3 (three) times daily. As needed for muscle spasm     ferrous sulfate 325 (65 FE) MG tablet  Take 1 tablet (325 mg total) by mouth 3 (three) times daily after meals.     HYDROcodone-acetaminophen 5-325 MG tablet  Commonly known as:  NORCO  Take 1-2 tablets by mouth every 6 (six) hours as needed for moderate pain. MAXIMUM TOTAL ACETAMINOPHEN DOSE IS 4000 MG PER DAY     losartan 100 MG tablet  Commonly known  as:  COZAAR  Take 50 mg by mouth 2 (two) times daily.     PRADAXA 110 MG Caps  Generic drug:  Dabigatran Etexilate Mesylate  Take 110 mg by mouth 2 (two) times daily.     sennosides-docusate sodium 8.6-50 MG tablet  Commonly known as:  SENOKOT-S  Take 2 tablets by mouth daily.     UNABLE TO FIND  Med Name: Med pass 120 mL by mouth twice daily between meals as supplement        Review of Systems  Constitutional: Negative for fever, chills, activity change, appetite change and fatigue.  HENT: Negative for congestion, rhinorrhea, sinus pressure, sneezing and sore throat.   Eyes: Negative.   Respiratory: Negative for cough, chest tightness, shortness of breath and wheezing.   Cardiovascular: Negative  for chest pain and palpitations.       Right leg swelling.   Gastrointestinal: Negative for nausea, vomiting, abdominal pain, diarrhea, constipation and abdominal distention.  Endocrine: Negative.   Genitourinary: Negative.   Musculoskeletal: Positive for gait problem.       Right hip pain   Skin: Negative.   Neurological: Negative for numbness.  Hematological: Negative.   Psychiatric/Behavioral: Negative.     Immunization History  Administered Date(s) Administered  . H1N1 05/26/2008  . Influenza Split 12/25/2010  . Influenza Whole 12/24/2009  . Influenza,inj,Quad PF,36+ Mos 02/01/2013, 01/10/2015  . Influenza-Unspecified 12/29/2013  . Pneumococcal Conjugate-13 02/03/2014  . Pneumococcal Polysaccharide-23 05/23/1998  . Td 03/24/2002, 02/01/2013   Pertinent  Health Maintenance Due  Topic Date Due  . INFLUENZA VACCINE  10/23/2015  . PNA vac Low Risk Adult  Completed   Fall Risk  03/08/2015 02/03/2014 02/01/2013  Falls in the past year? No No Yes  Number falls in past yr: - - 1  Injury with Fall? - - Yes  Risk Factor Category  - - High Fall Risk  Risk for fall due to : - - History of fall(s);Impaired mobility   Functional Status Survey:    Filed Vitals:   07/18/15 1048  BP: 121/70  Pulse: 116  Temp: 98.1 F (36.7 C)  Resp: 20  Height: 5\' 10"  (1.778 m)  Weight: 139 lb 9.6 oz (63.322 kg)  SpO2: 99%   Body mass index is 20.03 kg/(m^2). Physical Exam  Constitutional: He is oriented to person, place, and time. He appears well-developed and well-nourished. No distress.  HENT:  Head: Normocephalic.  Mouth/Throat: Oropharynx is clear and moist.  Eyes: Conjunctivae and EOM are normal. Pupils are equal, round, and reactive to light. Right eye exhibits no discharge. Left eye exhibits no discharge. No scleral icterus.  Neck: Normal range of motion. No JVD present.  Cardiovascular: Normal rate, regular rhythm, normal heart sounds and intact distal pulses.  Exam reveals no  gallop and no friction rub.   No murmur heard. Pulmonary/Chest: Effort normal and breath sounds normal. No respiratory distress. He has no wheezes. He has no rales.  Abdominal: Soft. Bowel sounds are normal. He exhibits no distension. There is no tenderness. There is no rebound and no guarding.  LBM 07/15/2015  Musculoskeletal: He exhibits no tenderness.  Right hip ROM limited due to pain. Right leg non-pitting edema.   Lymphadenopathy:    He has no cervical adenopathy.  Neurological: He is oriented to person, place, and time.  Skin: Skin is warm and dry. No rash noted. No erythema. No pallor.  Psychiatric: He has a normal mood and affect.    Labs reviewed:  Recent  Labs  05/09/15 1137  05/13/15 1815  06/26/15 0931 07/10/15 2235 07/12/15 0511 07/13/15 07/13/15 0521  NA 136  < >  --   < > 135 137 137 136* 136  K 4.8  < >  --   < > 4.1 3.9 4.6  --  4.1  CL 100  < >  --   < > 99 104 102  --  102  CO2 26  < >  --   < > 26 22 25   --  24  GLUCOSE 94  < >  --   < > 112* 103* 119*  --  115*  BUN 34*  < >  --   < > 31* 15 12 16 16   CREATININE 1.58*  < >  --   < > 1.49 1.02 1.16 1.2 1.17  CALCIUM 10.3  < >  --   < > 10.1 9.0 8.6*  --  8.5*  MG  --   --  1.9  --   --   --   --   --   --   PHOS 3.5  --   --   --  3.3  --   --   --   --   < > = values in this interval not displayed.  Recent Labs  01/10/15 0933 05/09/15 1137 05/13/15 1341 06/26/15 0931  AST 21  --  23  --   ALT 15  --  16*  --   ALKPHOS 75  --  49  --   BILITOT 1.0  --  1.0  --   PROT 7.8  --  6.2*  --   ALBUMIN 4.0 4.4 3.0* 4.3    Recent Labs  05/13/15 1341  06/26/15 0931 07/10/15 2235 07/12/15 0511 07/13/15 07/13/15 0521  WBC 10.6*  < > 9.5 11.4* 8.8 8.7 8.7  NEUTROABS 7.8*  --  6.5 8.5*  --   --   --   HGB 14.6  < > 15.4 12.6* 9.6*  --  9.0*  HCT 41.4  < > 45.5 37.0* 29.2*  --  27.2*  MCV 93.0  < > 96.9 94.4 95.7  --  95.8  PLT 230  < > 262.0 215 177  --  169  < > = values in this interval not  displayed. Lab Results  Component Value Date   TSH 4.185 05/13/2015   No results found for: HGBA1C Lab Results  Component Value Date   CHOL 222* 01/10/2015   HDL 46.50 01/10/2015   LDLCALC 140* 01/10/2015   LDLDIRECT 152.3 02/01/2013   TRIG 174.0* 01/10/2015   CHOLHDL 5 01/10/2015    Significant Diagnostic Results in last 30 days:  Ct Hip Right Wo Contrast  07/10/2015  CLINICAL DATA:  Right hip pain and unable to bear weight after a fall yesterday. EXAM: CT OF THE RIGHT HIP WITHOUT CONTRAST TECHNIQUE: Multidetector CT imaging of the right hip was performed according to the standard protocol. Multiplanar CT image reconstructions were also generated. COMPARISON:  Right hip radiographs 07/10/2015 FINDINGS: Degenerative changes in the right hip with complete loss of joint space, sclerosis, degenerative cysts on both sides of the joint, and prominent osteophytes on both sides of the joint. Inter trochanteric linear lucency at the base of the femoral neck, extending to the greater trochanter consistent with comminuted and nondisplaced acute fracture. Lesser trochanter does not appear to be involved. No significant hematoma or soft tissue swelling. Visualized pelvic structures appear intact.  IMPRESSION: Nondisplaced acute fracture of the base of the femoral neck at the inter trochanteric region with extension to the greater trochanter. Severe degenerative changes in the right hip. Electronically Signed   By: Lucienne Capers M.D.   On: 07/10/2015 23:03   Dg Chest Port 1 View  07/10/2015  CLINICAL DATA:  Preop hip surgery. EXAM: PORTABLE CHEST 1 VIEW COMPARISON:  05/12/2013 FINDINGS: The cardiomediastinal contours are unchanged, heart at the upper limits of normal in size. The lungs are clear. Pulmonary vasculature is normal. No consolidation, pleural effusion, or pneumothorax. No acute osseous abnormalities are seen. IMPRESSION: No acute process. Electronically Signed   By: Jeb Levering M.D.   On:  07/10/2015 23:33   Dg Hip Port Unilat With Pelvis 1v Right  07/11/2015  CLINICAL DATA:  Post right hip total arthroplasty. EXAM: DG HIP (WITH OR WITHOUT PELVIS) 1V PORT RIGHT COMPARISON:  Preoperative radiographs and CT yesterday. FINDINGS: Right hip total arthroplasty in expected alignment. No periprosthetic lucency or fracture. Expected postsurgical change in the soft tissues. Left hip arthroplasty appears intact, distal stem not entirely included. IMPRESSION: Right hip arthroplasty without immediate postoperative complication. Electronically Signed   By: Jeb Levering M.D.   On: 07/11/2015 18:30   Dg Hip Unilat  With Pelvis 2-3 Views Right  07/10/2015  CLINICAL DATA:  Lateral RIGHT hip pain after [place], unable to bear weight on RIGHT leg, limited range of motion RIGHT hip EXAM: DG HIP (WITH OR WITHOUT PELVIS) 2-3V RIGHT COMPARISON:  02/01/2008 FINDINGS: Diffuse osseous demineralization. Components of a LEFT hip prosthesis identified. Marked narrowing of RIGHT hip joint with minimal subchondral sclerosis, marginal spurs, bone on bone appearance and question subchondral cyst formation at femoral head. No definite acute fracture, dislocation or bone destruction. IMPRESSION: Progressive osteoarthritic changes RIGHT hip since 2009. Osseous demineralization. No definite acute bony abnormalities identified; if patient has persistent pain, may consider followup MR imaging. Electronically Signed   By: Lavonia Dana M.D.   On: 07/10/2015 16:36   Dg Femur, Min 2 Views Right  07/10/2015  CLINICAL DATA:  Right hip pain since a fall yesterday. EXAM: RIGHT FEMUR 2 VIEWS COMPARISON:  None. FINDINGS: Diffuse bone demineralization. Severe degenerative changes in the right hip with near complete narrowing of the acetabular joint space, bone sclerosis and subcortical cysts, and prominent osteophyte formation. No evidence of acute fracture or dislocation in the right femur. Mild  degenerative changes in the right knee. No effusion. Vascular calcifications. Postoperative changes in the proximal tibia. IMPRESSION: Degenerative changes in the right hip.  No acute bony abnormalities. Electronically Signed   By: Lucienne Capers M.D.   On: 07/10/2015 21:48    Assessment/Plan Insomnia  Start Melatonin 3 mg Tablet by mouth at bedtime.   Constipation  LBM 4/ 23/2017. Doculax suppository one per rectum x1 dose may repeat once if no results. Add miralax 17 Gm PKT mix in 8 oz fluid and drink daily.   Edema  Right leg non-pitting edema.Afebrile. None tender to touch and no redness. S/p  right total hip arthroplasty.Will obtain right leg Duplex studies r/o DVT    Family/ staff Communication:Reviewed plan of care with patient and facility Nurse  Labs/tests ordered: right leg Duplex studies r/o DVT

## 2015-07-30 ENCOUNTER — Non-Acute Institutional Stay (SKILLED_NURSING_FACILITY): Payer: Medicare Other | Admitting: Family

## 2015-07-30 ENCOUNTER — Encounter: Payer: Self-pay | Admitting: Family

## 2015-07-30 DIAGNOSIS — D649 Anemia, unspecified: Secondary | ICD-10-CM | POA: Diagnosis not present

## 2015-07-30 DIAGNOSIS — R269 Unspecified abnormalities of gait and mobility: Secondary | ICD-10-CM

## 2015-07-30 DIAGNOSIS — Z9660 Presence of unspecified orthopedic joint implant: Secondary | ICD-10-CM

## 2015-07-30 DIAGNOSIS — Z96649 Presence of unspecified artificial hip joint: Secondary | ICD-10-CM | POA: Insufficient documentation

## 2015-07-30 DIAGNOSIS — I482 Chronic atrial fibrillation, unspecified: Secondary | ICD-10-CM

## 2015-07-30 DIAGNOSIS — I10 Essential (primary) hypertension: Secondary | ICD-10-CM | POA: Diagnosis not present

## 2015-07-30 NOTE — Progress Notes (Signed)
Location:  [facility] [place] Room Number: [place]:  SNF [phone])  Provider: Marlowe Sax, NP Blanchie Serve, MD   PCP: Viviana Simpler, MD Patient Care Team: Venia Carbon, MD as PCP - General  Extended Emergency Contact Information Primary Emergency Contact: Heng,[name] Address: [address]          [place] [address] [place] of [place] Phone: [phone] Mobile Phone: [phone] Relation: Spouse Secondary Emergency Contact: [place] of [place] Phone: [phone] Relation: Daughter  Code Status: Full Code  Goals of care:  Advanced Directive information Advanced Directives 07/10/2015  Does patient have an advance directive? No  Type of Advance Directive -  Does patient want to make changes to advanced directive? -  Copy of advanced directive(s) in chart? -     No Known Allergies  Chief Complaint  Patient presents with  . Discharge Note    Discharged from SNF    HPI:  82 y.o. male seen today at [facility] for discharge home. He was here for short term rehabilitation post hospital admission from 07/10/15-07/13/15 with right femoral neck fracture. He underwent right total hip arthroplasty. He is seen in his room today with wife at the bedside. He denies any acute issues this visit. He states right hip pain well controlled with current regimen. He has worked well with PT/OT now stable for discharge home with PT/OT to continue with ROM, Exercise, gait stability and muscle strengthening. He states has own front wheel walker does not require any DME. The facility social worker will arrange Home health services prior to discharge.     Past Medical History  Diagnosis Date  . Hyperlipidemia   . Hypertensive heart disease   . Lumbar disc disease   . Osteoarthritis   . Chronic atrial fibrillation ([place])     a. 07/2012 Echo: EF 55-60%, no rwma, mildly dil RA/LA.  Marland Kitchen Syncope     a.  07/2012 - unclear etiology-->resulted in right tib/fib fx req ORIF/intramedullary nail of tibia.  . Fracture of femoral neck, right ([place]) 07/10/2015    Past Surgical History  Procedure Laterality Date  . Total hip arthroplasty      left  . Ankle surgery      right ankle  . Tibia im nail insertion Right 07/21/2012    Procedure: INTRAMEDULLARY (IM) NAIL TIBIAL;  Surgeon: Johnny Bridge, MD;  Location: [place];  Service: Orthopedics;  Laterality: Right;  . Total hip arthroplasty Right 07/11/2015    Procedure: TOTAL HIP ARTHROPLASTY;  Surgeon: Marchia Bond, MD;  Location: [place];  Service: Orthopedics;  Laterality: Right;      reports that he has never smoked. He has never used smokeless tobacco. He reports that he drinks about 7.2 oz of alcohol per week. He reports that he does not use illicit drugs. Social History   Social History  . Marital Status: Married    Spouse Name: N/A  . Number of Children: 2  . Years of Education: N/A   Occupational History  . retired  At [employer]   Social History Main Topics  . Smoking status: Never Smoker   . Smokeless tobacco: Never Used  . Alcohol Use: 7.2 oz/week    12 Cans of beer per week     Comment: 2 beers several x/wk and occas cocktail on top of it.  . Drug Use: No  . Sexual Activity: Not on file  Other Topics Concern  . Not on file   Social History Narrative   No living will   Requests that wife make decisions for him if needed   Would accept resuscitation but no prolonged artificial ventilation   Would not want tube feeds if cognitively unaware   Functional Status Survey:    No Known Allergies  Pertinent  Health Maintenance Due  Topic Date Due  . INFLUENZA VACCINE  10/23/2015  . PNA vac Low Risk Adult  Completed    Medications:   Medication List       This list is accurate as of: 07/30/15  9:09 AM.  Always use your most recent med list.               baclofen 10 MG tablet  Commonly known as:  LIORESAL  Take 1 tablet  (10 mg total) by mouth 3 (three) times daily. As needed for muscle spasm     ferrous sulfate 325 (65 FE) MG tablet  Take 1 tablet (325 mg total) by mouth 3 (three) times daily after meals.     HYDROcodone-acetaminophen 5-325 MG tablet  Commonly known as:  NORCO  Take 1-2 tablets by mouth every 6 (six) hours as needed for moderate pain. MAXIMUM TOTAL ACETAMINOPHEN DOSE IS 4000 MG PER DAY     losartan 50 MG tablet  Commonly known as:  COZAAR  Take 50 mg by mouth 2 (two) times daily.     Melatonin 3 MG Tabs  Take 1 tablet (3 mg total) by mouth at bedtime.     polyethylene glycol powder powder  Commonly known as:  GLYCOLAX/MIRALAX  Take 17 g by mouth daily.     PRADAXA 110 MG Caps  Generic drug:  Dabigatran Etexilate Mesylate  Take 110 mg by mouth 2 (two) times daily.     sennosides-docusate sodium 8.6-50 MG tablet  Commonly known as:  SENOKOT-S  Take 2 tablets by mouth daily.        Review of Systems  Constitutional: Negative for fever, chills, activity change, appetite change and fatigue.  HENT: Negative for congestion, rhinorrhea, sinus pressure, sneezing and sore throat.   Eyes: Negative.   Respiratory: Negative for cough, chest tightness, shortness of breath and wheezing.   Cardiovascular: Negative for chest pain and palpitations.  Gastrointestinal: Negative for nausea, vomiting, abdominal pain, diarrhea, constipation and abdominal distention.  Endocrine: Negative.   Genitourinary: Negative.   Musculoskeletal: Positive for gait problem.       Right hip pain   Skin: Negative.   Neurological: Negative for numbness.  Hematological: Negative.   Psychiatric/Behavioral: Negative.     Filed Vitals:   07/30/15 0841  BP: 132/76  Pulse: 81  Temp: 97.8 F (36.6 C)  Resp: 20  Height: 5\' 10"  (1.778 m)  SpO2: 93%   There is no weight on file to calculate BMI. Physical Exam  Constitutional: He is oriented to person, place, and time. He appears well-developed and  well-nourished. No distress.  HENT:  Head: Normocephalic.  Mouth/Throat: Oropharynx is clear and moist.  Eyes: Conjunctivae and EOM are normal. Pupils are equal, round, and reactive to light. Right eye exhibits no discharge. Left eye exhibits no discharge. No scleral icterus.  Neck: Normal range of motion. No JVD present. No thyromegaly present.  Cardiovascular: Normal rate, regular rhythm, normal heart sounds and intact distal pulses.  Exam reveals no gallop and no friction rub.   No murmur heard. Pulmonary/Chest: Effort normal and breath sounds normal. No  respiratory distress. He has no wheezes. He has no rales.  Abdominal: Soft. Bowel sounds are normal. He exhibits no distension. There is no tenderness. There is no rebound and no guarding.  Musculoskeletal: He exhibits no edema or tenderness.  Normal ROM except right hip limited with pain.   Lymphadenopathy:    He has no cervical adenopathy.  Neurological: He is oriented to person, place, and time.  Skin: Skin is warm and dry. No rash noted. No erythema. No pallor.  Psychiatric: He has a normal mood and affect.    Labs reviewed: Basic Metabolic Panel:  Recent Labs  05/09/15 1137  05/13/15 1815  06/26/15 0931 07/10/15 2235 07/12/15 0511 07/13/15 07/13/15 0521 07/18/15  NA 136  < >  --   < > 135 137 137 136* 136 140  K 4.8  < >  --   < > 4.1 3.9 4.6  --  4.1 4.4  CL 100  < >  --   < > 99 104 102  --  102  --   CO2 26  < >  --   < > 26 22 25   --  24  --   GLUCOSE 94  < >  --   < > 112* 103* 119*  --  115*  --   BUN 34*  < >  --   < > 31* 15 12 16 16 17   CREATININE 1.58*  < >  --   < > 1.49 1.02 1.16 1.2 1.17 0.9  CALCIUM 10.3  < >  --   < > 10.1 9.0 8.6*  --  8.5*  --   MG  --   --  1.9  --   --   --   --   --   --   --   PHOS 3.5  --   --   --  3.3  --   --   --   --   --   < > = values in this interval not displayed. Liver Function Tests:  Recent Labs  01/10/15 0933 05/09/15 1137 05/13/15 1341 06/26/15 0931  AST 21   --  23  --   ALT 15  --  16*  --   ALKPHOS 75  --  49  --   BILITOT 1.0  --  1.0  --   PROT 7.8  --  6.2*  --   ALBUMIN 4.0 4.4 3.0* 4.3   No results for input(s): LIPASE, AMYLASE in the last 8760 hours. No results for input(s): AMMONIA in the last 8760 hours. CBC:  Recent Labs  05/13/15 1341  06/26/15 0931 07/10/15 2235 07/12/15 0511 07/13/15 07/13/15 0521 07/18/15  WBC 10.6*  < > 9.5 11.4* 8.8 8.7 8.7 2.7  NEUTROABS 7.8*  --  6.5 8.5*  --   --   --   --   HGB 14.6  < > 15.4 12.6* 9.6*  --  9.0* 8.9*  HCT 41.4  < > 45.5 37.0* 29.2*  --  27.2* 28*  MCV 93.0  < > 96.9 94.4 95.7  --  95.8  --   PLT 230  < > 262.0 215 177  --  169 365  < > = values in this interval not displayed. Cardiac Enzymes:  Recent Labs  05/13/15 1815 05/13/15 2334 05/14/15 0536  TROPONINI 0.07* 0.05* 0.03   BNP: Invalid input(s): POCBNP CBG: No results for input(s): GLUCAP in the last 8760 hours.  Procedures  and Imaging Studies During Stay: Ct Hip Right Wo Contrast  07/10/2015  CLINICAL DATA:  Right hip pain and unable to bear weight after a fall yesterday. EXAM: CT OF THE RIGHT HIP WITHOUT CONTRAST TECHNIQUE: Multidetector CT imaging of the right hip was performed according to the standard protocol. Multiplanar CT image reconstructions were also generated. COMPARISON:  Right hip radiographs 07/10/2015 FINDINGS: Degenerative changes in the right hip with complete loss of joint space, sclerosis, degenerative cysts on both sides of the joint, and prominent osteophytes on both sides of the joint. Inter trochanteric linear lucency at the base of the femoral neck, extending to the greater trochanter consistent with comminuted and nondisplaced acute fracture. Lesser trochanter does not appear to be involved. No significant hematoma or soft tissue swelling. Visualized pelvic structures appear intact. IMPRESSION: Nondisplaced acute fracture of the base of the femoral neck at the inter trochanteric region with  extension to the greater trochanter. Severe degenerative changes in the right hip. Electronically Signed   By: Lucienne Capers M.D.   On: 07/10/2015 23:03   Dg Chest Port 1 View  07/10/2015  CLINICAL DATA:  Preop hip surgery. EXAM: PORTABLE CHEST 1 VIEW COMPARISON:  05/12/2013 FINDINGS: The cardiomediastinal contours are unchanged, heart at the upper limits of normal in size. The lungs are clear. Pulmonary vasculature is normal. No consolidation, pleural effusion, or pneumothorax. No acute osseous abnormalities are seen. IMPRESSION: No acute process. Electronically Signed   By: Jeb Levering M.D.   On: 07/10/2015 23:33   Dg Hip Port Unilat With Pelvis 1v Right  07/11/2015  CLINICAL DATA:  Post right hip total arthroplasty. EXAM: DG HIP (WITH OR WITHOUT PELVIS) 1V PORT RIGHT COMPARISON:  Preoperative radiographs and CT yesterday. FINDINGS: Right hip total arthroplasty in expected alignment. No periprosthetic lucency or fracture. Expected postsurgical change in the soft tissues. Left hip arthroplasty appears intact, distal stem not entirely included. IMPRESSION: Right hip arthroplasty without immediate postoperative complication. Electronically Signed   By: Jeb Levering M.D.   On: 07/11/2015 18:30   Dg Hip Unilat  With Pelvis 2-3 Views Right  07/10/2015  CLINICAL DATA:  Lateral RIGHT hip pain after falling last [place], unable to bear weight on RIGHT leg, limited range of motion RIGHT hip EXAM: DG HIP (WITH OR WITHOUT PELVIS) 2-3V RIGHT COMPARISON:  02/01/2008 FINDINGS: Diffuse osseous demineralization. Components of a LEFT hip prosthesis identified. Marked narrowing of RIGHT hip joint with minimal subchondral sclerosis, marginal spurs, bone on bone appearance and question subchondral cyst formation at femoral head. No definite acute fracture, dislocation or bone destruction. IMPRESSION: Progressive osteoarthritic changes RIGHT hip since 2009. Osseous demineralization. No  definite acute bony abnormalities identified; if patient has persistent pain, may consider followup MR imaging. Electronically Signed   By: Lavonia Dana M.D.   On: 07/10/2015 16:36   Dg Femur, Min 2 Views Right  07/10/2015  CLINICAL DATA:  Right hip pain since a fall yesterday. EXAM: RIGHT FEMUR 2 VIEWS COMPARISON:  None. FINDINGS: Diffuse bone demineralization. Severe degenerative changes in the right hip with near complete narrowing of the acetabular joint space, bone sclerosis and subcortical cysts, and prominent osteophyte formation. No evidence of acute fracture or dislocation in the right femur. Mild degenerative changes in the right knee. No effusion. Vascular calcifications. Postoperative changes in the proximal tibia. IMPRESSION: Degenerative changes in the right hip.  No acute bony abnormalities. Electronically Signed   By: Lucienne Capers M.D.   On: 07/10/2015  21:48    Assessment/Plan:  S/p Total hip Arthroplasty S/p short term rehabilitation post hospital admission from 07/10/15-07/13/15 with right femoral neck fracture. He underwent right total hip arthroplasty. Right surgical incision dry, clean and intact without any signs of infection. Has worked well with PT/OT. Afebrile. Pain under controlled with current regimen. Continue current medication.will discharge home to continue with PT/OT for ROM, exercise, gait stability and muscle strengthening. Continue Pradaxa 110 mg Tablet. Fall and safety precautions. Follow up with Orthopedic as scheduled.   HTN B/p stable. Continue Losartan 50 mg tablet. BMP in 1-2 weeks with PCP.   Afib HR controlled. Chest pain free. Continue Pradaxa 110 mg Tablet for coagulation.   Anemia  Continue with Ferrous sulfate 325 mg Tablet three times daily. CBC in 1-2 weeks with PCP.   Abnormal Gait   Has worked well with PT/OT will discharge home with PT/OT to continue with ROM, Exercise, gait stability and muscle strengthening. He states has own front wheel  walker does not require any DME.Fall and safety precautions.  Patient is being discharged with the following home health services:    PT/OT to continue with ROM, Exercise, gait stability and muscle strengthening.   Patient is being discharged with the following durable medical equipment:   He states has own front wheel walker does not require any DME.   Patient has been advised to f/u with their PCP in 1-2 weeks to bring them up to date on their rehab stay.  Social services at facility was responsible for arranging this appointment.  Pt was provided with a 30 day supply of prescriptions for medications and refills must be obtained from their PCP.  For controlled substances, a more limited supply may be provided adequate until PCP appointment only.  Future labs/tests needed: CBC, BMP in 1-2 weeks with PCP

## 2015-08-14 ENCOUNTER — Encounter: Payer: Self-pay | Admitting: Internal Medicine

## 2015-08-14 ENCOUNTER — Ambulatory Visit (INDEPENDENT_AMBULATORY_CARE_PROVIDER_SITE_OTHER): Payer: Medicare Other | Admitting: Internal Medicine

## 2015-08-14 VITALS — BP 130/70 | HR 86 | Temp 97.5°F | Wt 151.0 lb

## 2015-08-14 DIAGNOSIS — Z96649 Presence of unspecified artificial hip joint: Secondary | ICD-10-CM

## 2015-08-14 DIAGNOSIS — E441 Mild protein-calorie malnutrition: Secondary | ICD-10-CM | POA: Diagnosis not present

## 2015-08-14 DIAGNOSIS — I10 Essential (primary) hypertension: Secondary | ICD-10-CM | POA: Diagnosis not present

## 2015-08-14 DIAGNOSIS — I482 Chronic atrial fibrillation, unspecified: Secondary | ICD-10-CM

## 2015-08-14 DIAGNOSIS — Z9660 Presence of unspecified orthopedic joint implant: Secondary | ICD-10-CM | POA: Diagnosis not present

## 2015-08-14 MED ORDER — LOSARTAN POTASSIUM 50 MG PO TABS: 50.0000 mg | ORAL_TABLET | Freq: Two times a day (BID) | ORAL | Status: DC

## 2015-08-14 MED ORDER — FERROUS SULFATE 325 (65 FE) MG PO TABS: 325.0000 mg | ORAL_TABLET | Freq: Every day | ORAL | Status: DC

## 2015-08-14 NOTE — Assessment & Plan Note (Signed)
Rate is controlled On pradaxa Some chronic low energy issues may be related partly to this

## 2015-08-14 NOTE — Assessment & Plan Note (Signed)
Is improving Finishing with PT but needs to continue home exercise program Cut iron to daily

## 2015-08-14 NOTE — Progress Notes (Signed)
Subjective:    Patient ID: Martin Conner, male    DOB: 23-Aug-1927, 82 y.o.   MRN: [id number]  HPI Here with wife for follow up after hip fracture and rehab stay Repair by Dr Nilsa Nutting follow up tomorrow  Got home from rehab about 2 weeks ago Just finished home PT Pain is better-- mostly just taking the hydrocodone at night Discussed trying switch to tylenol Still on bid iron tabs  On pradaxa Doing well with this No palpitations No chest pain No SOB--but gets tired easily Will nap or fall asleep if just sitting Does fine if engaged or has visitors  Current Outpatient Prescriptions on File Prior to Visit  Medication Sig Dispense Refill  . ferrous sulfate 325 (65 FE) MG tablet Take 1 tablet (325 mg total) by mouth 3 (three) times daily after meals.  3  . HYDROcodone-acetaminophen (NORCO) 5-325 MG tablet Take 1-2 tablets by mouth every 6 (six) hours as needed for moderate pain. MAXIMUM TOTAL ACETAMINOPHEN DOSE IS 4000 MG PER DAY (Patient taking differently: Take 1-2 tablets by mouth every 6 (six) hours as needed for moderate pain or severe pain. MAXIMUM TOTAL ACETAMINOPHEN DOSE IS 4000 MG PER DAY) 50 tablet 0  . losartan (COZAAR) 50 MG tablet Take 50 mg by mouth 2 (two) times daily.    . Melatonin 3 MG TABS Take 1 tablet (3 mg total) by mouth at bedtime. 30 tablet 3  . polyethylene glycol powder (GLYCOLAX/MIRALAX) powder Take 17 g by mouth daily. 3350 g 1  . PRADAXA 110 MG CAPS Take 110 mg by mouth 2 (two) times daily.    . sennosides-docusate sodium (SENOKOT-S) 8.6-50 MG tablet Take 2 tablets by mouth daily. 30 tablet 1   No current facility-administered medications on file prior to visit.    No Known Allergies  Past Medical History  Diagnosis Date  . Hyperlipidemia   . Hypertensive heart disease   . Lumbar disc disease   . Osteoarthritis   . Chronic atrial fibrillation ([place])     a. 07/2012 Echo: EF 55-60%, no rwma, mildly dil RA/LA.  Marland Kitchen Syncope     a. 07/2012 - unclear  etiology-->resulted in right tib/fib fx req ORIF/intramedullary nail of tibia.  . Fracture of femoral neck, right ([place]) 07/10/2015    Past Surgical History  Procedure Laterality Date  . Total hip arthroplasty      left  . Ankle surgery      right ankle  . Tibia im nail insertion Right 07/21/2012    Procedure: INTRAMEDULLARY (IM) NAIL TIBIAL;  Surgeon: Johnny Bridge, MD;  Location: [facility];  Service: Orthopedics;  Laterality: Right;  . Total hip arthroplasty Right 07/11/2015    Procedure: TOTAL HIP ARTHROPLASTY;  Surgeon: Marchia Bond, MD;  Location: [facility];  Service: Orthopedics;  Laterality: Right;    Family History  Problem Relation Age of Onset  . Cancer Mother   . Hyperlipidemia Sister   . Hypertension Sister     Social History   Social History  . Marital Status: Married    Spouse Name: N/A  . Number of Children: 2  . Years of Education: N/A   Occupational History  . retired  At [employer]   Social History Main Topics  . Smoking status: Never Smoker   . Smokeless tobacco: Never Used  . Alcohol Use: 7.2 oz/week    12 Cans of beer per week     Comment: 2 beers several x/wk and occas cocktail on  top of it.  . Drug Use: No  . Sexual Activity: Not on file   Other Topics Concern  . Not on file   Social History Narrative   No living will   Requests that wife make decisions for him if needed   Would accept resuscitation but no prolonged artificial ventilation   Would not want tube feeds if cognitively unaware   Review of Systems Appetite is fair at best-may be picking up Has lost 25# since hip fracture---rarely will have an ensure Sleep is fair--- uses melatonin as needed    Objective:   Physical Exam  Constitutional: He appears well-developed. No distress.  Neck: Normal range of motion. No thyromegaly present.  Cardiovascular: Normal rate and normal heart sounds.  Exam reveals no gallop.   No murmur heard. irregular  Pulmonary/Chest: Effort normal and breath  sounds normal. No respiratory distress. He has no wheezes. He has no rales.  Abdominal: Soft. There is no tenderness.  Musculoskeletal: He exhibits no edema or tenderness.  Able to get on table with some difficulty Walks okay with rolling walker  Lymphadenopathy:    He has no cervical adenopathy.  Psychiatric: He has a normal mood and affect. His behavior is normal.          Assessment & Plan:

## 2015-08-14 NOTE — Patient Instructions (Addendum)
Please try ensure or boost-- 1 can (with ice cream if desired) twice a day inbetween meals. Decrease iron to 1 tab daily

## 2015-08-14 NOTE — Assessment & Plan Note (Signed)
Asked him to add bid supplements

## 2015-08-14 NOTE — Progress Notes (Signed)
Pre visit review using our clinic review tool, if applicable. No additional management support is needed unless otherwise documented below in the visit note. 

## 2015-08-14 NOTE — Assessment & Plan Note (Signed)
BP Readings from Last 3 Encounters:  08/14/15 130/70  07/30/15 132/76  07/18/15 121/70   Has done better splitting the losartan dose

## 2015-09-03 ENCOUNTER — Encounter: Payer: Self-pay | Admitting: Internal Medicine

## 2015-09-07 ENCOUNTER — Encounter: Payer: Self-pay | Admitting: Internal Medicine

## 2015-09-07 ENCOUNTER — Ambulatory Visit (INDEPENDENT_AMBULATORY_CARE_PROVIDER_SITE_OTHER): Payer: Medicare Other | Admitting: Internal Medicine

## 2015-09-07 VITALS — BP 144/74 | HR 86 | Ht 70.0 in | Wt 177.8 lb

## 2015-09-07 DIAGNOSIS — I442 Atrioventricular block, complete: Secondary | ICD-10-CM | POA: Diagnosis not present

## 2015-09-07 DIAGNOSIS — I482 Chronic atrial fibrillation: Secondary | ICD-10-CM | POA: Diagnosis not present

## 2015-09-07 DIAGNOSIS — I10 Essential (primary) hypertension: Secondary | ICD-10-CM | POA: Diagnosis not present

## 2015-09-07 DIAGNOSIS — I4821 Permanent atrial fibrillation: Secondary | ICD-10-CM

## 2015-09-07 NOTE — Progress Notes (Signed)
Electrophysiology Office Note Date: 09/07/2015  ID:  Martin Conner, DOB 07-17-1927, MRN [id number]  PCP: Viviana Simpler, MD Electrophysiologist: Rayann Heman  CC: afib  Martin Conner is a 82 y.o. male with prior bradycardia and permanent afib who presents today for EP follow-up.  Doing very well for his age.  No symptoms of bradycardia.  He has permanent afib.  He had a mechanical fall in April and fractured his hip.  Very clear that he did not have any symptoms of bradycardia prior to his fall. He has made great recovery since his surgery and is back to normal activity.  He has not had any bleeding with anticoagulation. He denies chest pain, palpitations, dyspnea, PND, orthopnea, nausea, vomiting, dizziness, syncope, edema, weight gain, or early satiety.  Past Medical History  Diagnosis Date  . Hyperlipidemia   . Hypertensive heart disease   . Lumbar disc disease   . Osteoarthritis   . Permanent atrial fibrillation ([place])     a. 07/2012 Echo: EF 55-60%, no rwma, mildly dil RA/LA.  Marland Kitchen Syncope     a. 07/2012 - unclear etiology-->resulted in right tib/fib fx req ORIF/intramedullary nail of tibia.  . Fracture of femoral neck, right ([place]) 07/10/2015    s/p mechanical fall   Past Surgical History  Procedure Laterality Date  . Total hip arthroplasty      left  . Ankle surgery      right ankle  . Tibia im nail insertion Right 07/21/2012    Procedure: INTRAMEDULLARY (IM) NAIL TIBIAL;  Surgeon: Johnny Bridge, MD;  Location: [facility];  Service: Orthopedics;  Laterality: Right;  . Total hip arthroplasty Right 07/11/2015    Procedure: TOTAL HIP ARTHROPLASTY;  Surgeon: Marchia Bond, MD;  Location: [facility];  Service: Orthopedics;  Laterality: Right;    Current Outpatient Prescriptions  Medication Sig Dispense Refill  . Acetaminophen (TYLENOL PO) Take 1 tablet by mouth daily as needed (pain). Pt unsure of strength    . ferrous sulfate 325 (65 FE) MG tablet Take 1 tablet (325 mg total) by mouth daily. 1  tablet 0  . losartan (COZAAR) 50 MG tablet Take 1 tablet (50 mg total) by mouth 2 (two) times daily. 180 tablet 3  . Melatonin 3 MG TABS Take 1 tablet by mouth at bedtime as needed (sleep).    . PRADAXA 110 MG CAPS Take 110 mg by mouth 2 (two) times daily.    . sennosides-docusate sodium (SENOKOT-S) 8.6-50 MG tablet Take 2 tablets by mouth daily. 30 tablet 1   No current facility-administered medications for this visit.    Allergies:   Review of patient's allergies indicates no known allergies.   Social History: Social History   Social History  . Marital Status: Married    Spouse Name: N/A  . Number of Children: 2  . Years of Education: N/A   Occupational History  . retired  At [employer]   Social History Main Topics  . Smoking status: Never Smoker   . Smokeless tobacco: Never Used  . Alcohol Use: 7.2 oz/week    12 Cans of beer per week     Comment: 2 beers several x/wk and occas cocktail on top of it.  . Drug Use: No  . Sexual Activity: Not on file   Other Topics Concern  . Not on file   Social History Narrative   No living will   Requests that wife make decisions for him if needed   Would  accept resuscitation but no prolonged artificial ventilation   Would not want tube feeds if cognitively unaware    Family History: Family History  Problem Relation Age of Onset  . Cancer Mother   . Hyperlipidemia Sister   . Hypertension Sister     Review of Systems: All other systems reviewed and are otherwise negative except as noted above.   Physical Exam: VS:  BP 144/74 mmHg  Pulse 86  Ht 5\' 10"  (1.778 m)  Wt 177 lb 12.8 oz (80.65 kg)  BMI 25.51 kg/m2 , BMI Body mass index is 25.51 kg/(m^2). Wt Readings from Last 3 Encounters:  09/07/15 177 lb 12.8 oz (80.65 kg)  08/14/15 151 lb (68.493 kg)  07/30/15 138 lb (62.596 kg)    GEN- The patient is elderly appearing, alert and oriented x 3 today.   HEENT: normocephalic, atraumatic; sclera clear, conjunctiva pink; hearing  intact; oropharynx clear; neck supple Lungs- Clear to ausculation bilaterally, normal work of breathing.  No wheezes, rales, rhonchi Heart- Irregular rate and rhythm  GI- soft, non-tender, non-distended, bowel sounds present  Extremities- no clubbing, cyanosis, or edema; DP/PT/radial pulses 2+ bilaterally MS- no significant deformity or atrophy Skin- warm and dry, no rash or lesion  Psych- euthymic mood, full affect Neuro- strength and sensation are intact   EKG:  EKG is ordered today. The ekg ordered today shows atrial fibrillation, ventricular rate 86 bpm, iRBBB, nonspecific St/T changes  Recent Labs: 05/13/2015: ALT 16*; B Natriuretic Peptide 251.3*; Magnesium 1.9; TSH 4.185 07/18/2015: BUN 17; Creatinine 0.9; Hemoglobin 8.9*; Platelets 365; Potassium 4.4; Sodium 140    Other studies Reviewed: Additional studies/ records that were reviewed today include: prior hospital records   Assessment and Plan: 1.  Transient complete heart block Resolved off of metoprolol No further workup planned at this time If recurrent syncope/pre-syncope, will need PPM implant, discussed with patient today  2.  Permanent atrial fibrillation controlled CHADS2VASC is 3, Pradaxa followed by Dr Silvio Pate - CrCl 66 by recent blood work.  3.  HTN Stable No change required today   Current medicines are reviewed at length with the patient today.   The patient does not have concerns regarding his medicines.  The following changes were made today:  none    Disposition:   Follow up with EP PA every 6 months.  I will see when needed going forward   Army Fossa MD  09/07/2015 11:07 AM   [place] [address] [phone] (office) [phone] (fax)

## 2015-09-07 NOTE — Patient Instructions (Signed)
Medication Instructions:  Your physician recommends that you continue on your current medications as directed. Please refer to the Current Medication list given to you today.   Labwork: None ordered   Testing/Procedures: None ordered   Follow-Up: Your physician wants you to follow-up in: 6 months with Renee Urusy, PA You will receive a reminder letter in the mail two months in advance. If you don't receive a letter, please call our office to schedule the follow-up appointment.   Any Other Special Instructions Will Be Listed Below (If Applicable).     If you need a refill on your cardiac medications before your next appointment, please call your pharmacy.   

## 2015-10-09 ENCOUNTER — Encounter: Payer: Self-pay | Admitting: Internal Medicine

## 2015-10-09 ENCOUNTER — Ambulatory Visit (INDEPENDENT_AMBULATORY_CARE_PROVIDER_SITE_OTHER): Payer: Medicare Other | Admitting: Internal Medicine

## 2015-10-09 VITALS — BP 144/96 | HR 80 | Temp 98.5°F | Ht 70.0 in | Wt 180.8 lb

## 2015-10-09 DIAGNOSIS — I482 Chronic atrial fibrillation, unspecified: Secondary | ICD-10-CM

## 2015-10-09 DIAGNOSIS — D649 Anemia, unspecified: Secondary | ICD-10-CM

## 2015-10-09 DIAGNOSIS — E441 Mild protein-calorie malnutrition: Secondary | ICD-10-CM

## 2015-10-09 NOTE — Assessment & Plan Note (Signed)
Post op anemia Will recheck to see if resolved

## 2015-10-09 NOTE — Assessment & Plan Note (Signed)
Rate is controlled Continues on the pradaxa

## 2015-10-09 NOTE — Progress Notes (Signed)
Subjective:    Patient ID: Martin Conner, male    DOB: 04-01-1927, 82 y.o.   MRN: [id number]  HPI Here for follow up mostly of nutritional status  Doing well since the [facility]--- still some soreness but doing well Eating back to normal Still taking supplements Weight back to his baseline  No palpitations No chest pain No SOB Still on BP med  Current Outpatient Prescriptions on File Prior to Visit  Medication Sig Dispense Refill  . Acetaminophen (TYLENOL PO) Take 1 tablet by mouth daily as needed (pain). Pt unsure of strength    . ferrous sulfate 325 (65 FE) MG tablet Take 1 tablet (325 mg total) by mouth daily. 1 tablet 0  . losartan (COZAAR) 50 MG tablet Take 1 tablet (50 mg total) by mouth 2 (two) times daily. 180 tablet 3  . Melatonin 3 MG TABS Take 1 tablet by mouth at bedtime as needed (sleep).    . PRADAXA 110 MG CAPS Take 110 mg by mouth 2 (two) times daily.    . sennosides-docusate sodium (SENOKOT-S) 8.6-50 MG tablet Take 2 tablets by mouth daily. 30 tablet 1   No current facility-administered medications on file prior to visit.    No Known Allergies  Past Medical History  Diagnosis Date  . Hyperlipidemia   . Hypertensive heart disease   . Lumbar disc disease   . Osteoarthritis   . Permanent atrial fibrillation ([place])     a. 07/2012 Echo: EF 55-60%, no rwma, mildly dil RA/LA.  Marland Kitchen Syncope     a. 07/2012 - unclear etiology-->resulted in right tib/fib fx req ORIF/intramedullary nail of tibia.  . Fracture of femoral neck, right ([place]) 07/10/2015    s/p mechanical fall    Past Surgical History  Procedure Laterality Date  . Total hip arthroplasty      left  . Ankle surgery      right ankle  . Tibia im nail insertion Right 07/21/2012    Procedure: INTRAMEDULLARY (IM) NAIL TIBIAL;  Surgeon: Johnny Bridge, MD;  Location: [employer] [facility];  Service: Orthopedics;  Laterality: Right;  . Total hip arthroplasty Right 07/11/2015    Procedure: TOTAL HIP ARTHROPLASTY;  Surgeon: Marchia Bond,  MD;  Location: [facility];  Service: Orthopedics;  Laterality: Right;    Family History  Problem Relation Age of Onset  . Cancer Mother   . Hyperlipidemia Sister   . Hypertension Sister     Social History   Social History  . Marital Status: Married    Spouse Name: N/A  . Number of Children: 2  . Years of Education: N/A   Occupational History  . retired  At [employer]   Social History Main Topics  . Smoking status: Never Smoker   . Smokeless tobacco: Never Used  . Alcohol Use: 7.2 oz/week    12 Cans of beer per week     Comment: 2 beers several x/wk and occas cocktail on top of it.  . Drug Use: No  . Sexual Activity: Not on file   Other Topics Concern  . Not on file   Social History Narrative   No living will   Requests that wife make decisions for him if needed   Would accept resuscitation but no prolonged artificial ventilation   Would not want tube feeds if cognitively unaware   Review of Systems  Sleeps okay--- occasionally takes OTC sleeping pill (melatonin) Bowels are fine No headaches     Objective:   Physical  Exam  Constitutional: He appears well-developed and well-nourished. No distress.  Neck: Normal range of motion. Neck supple. No thyromegaly present.  Cardiovascular: Normal rate and normal heart sounds.  Exam reveals no gallop.   No murmur heard. irregular  Pulmonary/Chest: Effort normal and breath sounds normal. No respiratory distress. He has no wheezes. He has no rales.  Musculoskeletal: He exhibits no edema.  Lymphadenopathy:    He has no cervical adenopathy.          Assessment & Plan:

## 2015-10-09 NOTE — Assessment & Plan Note (Signed)
Has regained all the weight he lost Will have him stop the supplements

## 2015-10-09 NOTE — Progress Notes (Signed)
Pre visit review using our clinic review tool, if applicable. No additional management support is needed unless otherwise documented below in the visit note. 

## 2015-10-10 LAB — CBC WITH DIFFERENTIAL/PLATELET
Basophils Absolute: 0.1 10*3/uL (ref 0.0–0.1)
Basophils Relative: 1 % (ref 0.0–3.0)
EOS ABS: 0.2 10*3/uL (ref 0.0–0.7)
EOS PCT: 2.1 % (ref 0.0–5.0)
HCT: 42.8 % (ref 39.0–52.0)
HEMOGLOBIN: 14.1 g/dL (ref 13.0–17.0)
LYMPHS PCT: 20.2 % (ref 12.0–46.0)
Lymphs Abs: 1.6 10*3/uL (ref 0.7–4.0)
MCHC: 32.8 g/dL (ref 30.0–36.0)
MCV: 96.6 fl (ref 78.0–100.0)
MONO ABS: 1 10*3/uL (ref 0.1–1.0)
Monocytes Relative: 12.1 % — ABNORMAL HIGH (ref 3.0–12.0)
NEUTROS PCT: 64.6 % (ref 43.0–77.0)
Neutro Abs: 5.2 10*3/uL (ref 1.4–7.7)
Platelets: 295 10*3/uL (ref 150.0–400.0)
RBC: 4.43 Mil/uL (ref 4.22–5.81)
RDW: 18.1 % — ABNORMAL HIGH (ref 11.5–15.5)
WBC: 8 10*3/uL (ref 4.0–10.5)

## 2016-02-18 ENCOUNTER — Encounter: Payer: Self-pay | Admitting: Internal Medicine

## 2016-02-25 ENCOUNTER — Ambulatory Visit: Payer: Medicare Other | Admitting: Internal Medicine

## 2016-03-10 ENCOUNTER — Ambulatory Visit (INDEPENDENT_AMBULATORY_CARE_PROVIDER_SITE_OTHER): Payer: Medicare Other

## 2016-03-10 VITALS — BP 150/100 | HR 90 | Temp 97.6°F | Ht 70.0 in | Wt 180.5 lb

## 2016-03-10 DIAGNOSIS — E7849 Other hyperlipidemia: Secondary | ICD-10-CM

## 2016-03-10 DIAGNOSIS — E784 Other hyperlipidemia: Secondary | ICD-10-CM | POA: Diagnosis not present

## 2016-03-10 DIAGNOSIS — Z0000 Encounter for general adult medical examination without abnormal findings: Secondary | ICD-10-CM

## 2016-03-10 LAB — LIPID PANEL
CHOLESTEROL: 212 mg/dL — AB (ref 0–200)
HDL: 54.1 mg/dL (ref >39.00)
LDL Cholesterol: 137 mg/dL — ABNORMAL HIGH (ref 0–99)
NonHDL: 158.17
TRIGLYCERIDES: 106 mg/dL (ref 0.0–149.0)
Total CHOL/HDL Ratio: 4
VLDL: 21.2 mg/dL (ref 0.0–40.0)

## 2016-03-10 NOTE — Progress Notes (Signed)
PCP notes:   Health maintenance:  Flu vaccine - per pt, vaccine taken in Oct 2017 Shingles - declined  Abnormal screenings:   Fall risk - hx of fall with injury Hearing - failed  Patient concerns:   None  Nurse concerns:  None  Next PCP appt:   04/15/16 @ 0900

## 2016-03-10 NOTE — Progress Notes (Signed)
Pre visit review using our clinic review tool, if applicable. No additional management support is needed unless otherwise documented below in the visit note. 

## 2016-03-10 NOTE — Patient Instructions (Signed)
Mr. Martin Conner , Thank you for taking time to come for your Medicare Wellness Visit. I appreciate your ongoing commitment to your health goals. Please review the following plan we discussed and let me know if I can assist you in the future.   These are the goals we discussed: Goals    . Reduce portion size          Starting 03/10/2016, I will continue to eat 1 slice low carb bread daily in an effort to maintain current weight.        This is a list of the screening recommended for you and due dates:  Health Maintenance  Topic Date Due  . Shingles Vaccine  03/10/2026*  . Tetanus Vaccine  02/02/2023  . Flu Shot  Addressed  . Pneumonia vaccines  Completed  *Topic was postponed. The date shown is not the original due date.   Preventive Care for Adults  A healthy lifestyle and preventive care can promote health and wellness. Preventive health guidelines for adults include the following key practices.  . A routine yearly physical is a good way to check with your health care provider about your health and preventive screening. It is a chance to share any concerns and updates on your health and to receive a thorough exam.  . Visit your dentist for a routine exam and preventive care every 6 months. Brush your teeth twice a day and floss once a day. Good oral hygiene prevents tooth decay and gum disease.  . The frequency of eye exams is based on your age, health, family medical history, use  of contact lenses, and other factors. Follow your health care provider's ecommendations for frequency of eye exams.  . Eat a healthy diet. Foods like vegetables, fruits, whole grains, low-fat dairy products, and lean protein foods contain the nutrients you need without too many calories. Decrease your intake of foods high in solid fats, added sugars, and salt. Eat the right amount of calories for you. Get information about a proper diet from your health care provider, if necessary.  . Regular physical exercise  is one of the most important things you can do for your health. Most adults should get at least 150 minutes of moderate-intensity exercise (any activity that increases your heart rate and causes you to sweat) each week. In addition, most adults need muscle-strengthening exercises on 2 or more days a week.  Silver Sneakers may be a benefit available to you. To determine eligibility, you may visit the website: www.silversneakers.com or contact program at 440-732-8636 Mon-Fri between 8AM-8PM.   . Maintain a healthy weight. The body mass index (BMI) is a screening tool to identify possible weight problems. It provides an estimate of body fat based on height and weight. Your health care provider can find your BMI and can help you achieve or maintain a healthy weight.   For adults 20 years and older: ? A BMI below 18.5 is considered underweight. ? A BMI of 18.5 to 24.9 is normal. ? A BMI of 25 to 29.9 is considered overweight. ? A BMI of 30 and above is considered obese.   . Maintain normal blood lipids and cholesterol levels by exercising and minimizing your intake of saturated fat. Eat a balanced diet with plenty of fruit and vegetables. Blood tests for lipids and cholesterol should begin at age 63 and be repeated every 5 years. If your lipid or cholesterol levels are high, you are over 50, or you are at high risk for  heart disease, you may need your cholesterol levels checked more frequently. Ongoing high lipid and cholesterol levels should be treated with medicines if diet and exercise are not working.  . If you smoke, find out from your health care provider how to quit. If you do not use tobacco, please do not start.  . If you choose to drink alcohol, please do not consume more than 2 drinks per day. One drink is considered to be 12 ounces (355 mL) of beer, 5 ounces (148 mL) of wine, or 1.5 ounces (44 mL) of liquor.  . If you are 55-33 years old, ask your health care provider if you should take  aspirin to prevent strokes.  . Use sunscreen. Apply sunscreen liberally and repeatedly throughout the day. You should seek shade when your shadow is shorter than you. Protect yourself by wearing long sleeves, pants, a wide-brimmed hat, and sunglasses year round, whenever you are outdoors.  . Once a month, do a whole body skin exam, using a mirror to look at the skin on your back. Tell your health care provider of new moles, moles that have irregular borders, moles that are larger than a pencil eraser, or moles that have changed in shape or color.

## 2016-03-10 NOTE — Progress Notes (Signed)
Subjective:   Martin Conner is a 82 y.o. male who presents for Medicare Annual/Subsequent preventive examination.  Review of Systems:  N/A Cardiac Risk Factors include: advanced age (>29men, >65 women);male gender;dyslipidemia;hypertension     Objective:    Vitals: BP (!) 150/100 (BP Location: Right Arm, Patient Position: Sitting, Cuff Size: Normal) Comment: BP meds taken  Pulse 90   Temp 97.6 F (36.4 C) (Oral)   Ht 5\' 10"  (1.778 m) Comment: shoes  Wt 180 lb 8 oz (81.9 kg)   SpO2 97%   BMI 25.90 kg/m   Body mass index is 25.9 kg/m.  Tobacco History  Smoking Status  . Never Smoker  Smokeless Tobacco  . Never Used     Counseling given: No   Past Medical History:  Diagnosis Date  . Fracture of femoral neck, right ([place]) 07/10/2015   s/p mechanical fall  . Hyperlipidemia   . Hypertensive heart disease   . Lumbar disc disease   . Osteoarthritis   . Permanent atrial fibrillation ([place])    a. 07/2012 Echo: EF 55-60%, no rwma, mildly dil RA/LA.  Marland Kitchen Syncope    a. 07/2012 - unclear etiology-->resulted in right tib/fib fx req ORIF/intramedullary nail of tibia.   Past Surgical History:  Procedure Laterality Date  . ANKLE SURGERY     right ankle  . TIBIA IM NAIL INSERTION Right 07/21/2012   Procedure: INTRAMEDULLARY (IM) NAIL TIBIAL;  Surgeon: Johnny Bridge, MD;  Location: [facility];  Service: Orthopedics;  Laterality: Right;  . TOTAL HIP ARTHROPLASTY     left  . TOTAL HIP ARTHROPLASTY Right 07/11/2015   Procedure: TOTAL HIP ARTHROPLASTY;  Surgeon: Marchia Bond, MD;  Location: [facility];  Service: Orthopedics;  Laterality: Right;   Family History  Problem Relation Age of Onset  . Cancer Mother   . Hyperlipidemia Sister   . Hypertension Sister    History  Sexual Activity  . Sexual activity: No    Outpatient Encounter Prescriptions as of 03/10/2016  Medication Sig  . Acetaminophen (TYLENOL PO) Take 1 tablet by mouth daily as needed (pain). Pt unsure of strength  . losartan  (COZAAR) 50 MG tablet Take 1 tablet (50 mg total) by mouth 2 (two) times daily.  . Melatonin 3 MG TABS Take 1 tablet by mouth at bedtime as needed (sleep).  . PRADAXA 110 MG CAPS Take 110 mg by mouth 2 (two) times daily.  . sennosides-docusate sodium (SENOKOT-S) 8.6-50 MG tablet Take 2 tablets by mouth daily. (Patient taking differently: Take 2 tablets by mouth as needed. )  . ferrous sulfate 325 (65 FE) MG tablet Take 1 tablet (325 mg total) by mouth daily. (Patient not taking: Reported on 03/10/2016)   No facility-administered encounter medications on file as of 03/10/2016.     Activities of Daily Living In your present state of health, do you have any difficulty performing the following activities: 03/10/2016 05/13/2015  Hearing? N N  Vision? N N  Difficulty concentrating or making decisions? N N  Walking or climbing stairs? N N  Dressing or bathing? N N  Doing errands, shopping? N N  Preparing Food and eating ? N -  Using the Toilet? N -  In the past six months, have you accidently leaked urine? N -  Do you have problems with loss of bowel control? N -  Managing your Medications? N -  Managing your Finances? N -  Housekeeping or managing your Housekeeping? N -  Some recent data might  be hidden    Patient Care Team: Venia Carbon, MD as PCP - General Thompson Grayer, MD as Consulting Physician (Cardiology) Marchia Bond, MD as Consulting Physician (Orthopedic Surgery)   Assessment:     Hearing Screening   125Hz  250Hz  500Hz  1000Hz  2000Hz  3000Hz  4000Hz  6000Hz  8000Hz   Right ear:   0 0 0  0    Left ear:   0 0 0  0      Visual Acuity Screening   Right eye Left eye Both eyes  Without correction: 20/50 20/40 20/40   With correction:       Exercise Activities and Dietary recommendations Current Exercise Habits: The patient does not participate in regular exercise at present, Exercise limited by: None identified  Goals    . Reduce portion size          Starting 03/10/2016, I  will continue to eat 1 slice low carb bread daily in an effort to maintain current weight.       Fall Risk Fall Risk  03/10/2016 03/08/2015 02/03/2014 02/01/2013  Falls in the past year? Yes No No Yes  Number falls in past yr: 1 - - 1  Injury with Fall? Yes - - Yes  Risk Factor Category  - - - High Fall Risk  Risk for fall due to : - - - History of fall(s);Impaired mobility  Follow up Falls evaluation completed - - -   Depression Screen PHQ 2/9 Scores 03/10/2016 03/08/2015 02/03/2014 02/01/2013  PHQ - 2 Score 0 0 0 0    Cognitive Function MMSE - Mini Mental State Exam 03/10/2016  Orientation to time 5  Orientation to Place 5  Registration 3  Attention/ Calculation 0  Recall 3  Language- name 2 objects 0  Language- repeat 1  Language- follow 3 step command 3  Language- read & follow direction 0  Write a sentence 0  Copy design 0  Total score 20     PLEASE NOTE: A Mini-Cog screen was completed. Maximum score is 20. A value of 0 denotes this part of [place] MMSE was not completed or the patient failed this part of the Mini-Cog screening.   Mini-Cog Screening Orientation to Time - Max 5 pts Orientation to Place - Max 5 pts Registration - Max 3 pts Recall - Max 3 pts Language Repeat - Max 1 pts Language Follow 3 Step Command - Max 3 pts     Immunization History  Administered Date(s) Administered  . H1N1 05/26/2008  . Influenza Split 12/25/2010  . Influenza Whole 12/24/2009  . Influenza,inj,Quad PF,36+ Mos 02/01/2013, 01/10/2015  . Influenza-Unspecified 12/29/2013, 07/27/2015, 12/23/2015  . PPD Test 07/13/2015  . Pneumococcal Conjugate-13 02/03/2014  . Pneumococcal Polysaccharide-23 05/23/1998  . Td 03/24/2002, 02/01/2013   Screening Tests Health Maintenance  Topic Date Due  . ZOSTAVAX  03/10/2026 (Originally 10/27/1989)  . TETANUS/TDAP  02/02/2023  . INFLUENZA VACCINE  Addressed  . PNA vac Low Risk Adult  Completed      Plan:     I have personally  reviewed and addressed the Medicare Annual Wellness questionnaire and have noted the following in the patient's chart:  A. Medical and social history B. Use of alcohol, tobacco or illicit drugs  C. Current medications and supplements D. Functional ability and status E.  Nutritional status F.  Physical activity G. Advance directives H. List of other physicians I.  Hospitalizations, surgeries, and ER visits in previous 12 months J.  Vitals K. Screenings to include hearing, vision, cognitive,  depression L. Referrals and appointments - none  In addition, I have reviewed and discussed with patient certain preventive protocols, quality metrics, and best practice recommendations. A written personalized care plan for preventive services as well as general preventive health recommendations were provided to patient.  See attached scanned questionnaire for additional information.   Signed,   Lindell Noe, MHA, BS, LPN Health Coach

## 2016-03-10 NOTE — Progress Notes (Signed)
   Subjective:    Patient ID: Martin Conner, male    DOB: 06-11-1927, 82 y.o.   MRN: [id number]  HPI I reviewed health advisor's note, was available for consultation, and agree with documentation and plan.    Review of Systems     Objective:   Physical Exam        Assessment & Plan:

## 2016-03-11 ENCOUNTER — Telehealth: Payer: Self-pay

## 2016-03-11 NOTE — Telephone Encounter (Signed)
Pt brought a letter from [facility] stating Pradaxa will be on the nonpreferred list for 2018. The preferred medications are Eliquis and Xarelto.  If we want him to remain on Pradaxa, we will need to do a Prior Auth.  Please advise if you want to change his medication or attempt the prior auth.

## 2016-03-12 MED ORDER — APIXABAN 5 MG PO TABS
5.0000 mg | ORAL_TABLET | Freq: Two times a day (BID) | ORAL | 3 refills | Status: DC
Start: 2016-03-12 — End: 2017-02-23

## 2016-03-12 NOTE — Telephone Encounter (Signed)
Please let him know that I am fine with changing to eliquis 5mg  bid (I actually prefer this medication) Send Rx if he agrees

## 2016-03-12 NOTE — Telephone Encounter (Signed)
Spoke to pt. He does want to change to Eliquis. I will send the rx to [facility].

## 2016-03-12 NOTE — Telephone Encounter (Signed)
Spoke to pt's wife, [name]. She will pass on the message to the pt and he will call us

## 2016-03-13 NOTE — Telephone Encounter (Signed)
Form done. Please fax back.

## 2016-03-13 NOTE — Telephone Encounter (Signed)
Fax form received from [facility] stating the 5mg  twice a day may be too much for pt his age. Asking for the form to be filled out and faxed back. Form in Dr Alla German InBox on his desk

## 2016-03-18 NOTE — Progress Notes (Signed)
Cardiology Office Note Date:  09-22-202017  Patient ID:  Martin, Conner [birth year], MRN [id number] PCP:  Viviana Simpler, MD  Electrophysiologist: Dr. Rayann Heman   Chief Complaint: routine visit  History of Present Illness: Martin Conner is a 82 y.o. male with history of permanent AFib, HLD, HTN, mechanical fall in April notes note no symptoms associated with fall.  He comes in to the office to be seen today for Dr. Rayann Heman, last seen by him in June, doing well without changes made.    He is feeling very well, describes a pretty sedentary life style but gets out and around without any kind of exertional intolerances.  He not had any more trip/fall accidents, no CP rarely is aware of any palpitations.  No reports of any dizziness, near syncope or syncope.  He was switched to Eliquis via his PMD secondary to cost, denies any kind of bleeding or signs of bleeding.   AFib Hx permanent Hx of transient CHB resolved of metoprolol Pradaxa   Past Medical History:  Diagnosis Date  . Fracture of femoral neck, right ([place]) 07/10/2015   s/p mechanical fall  . Hyperlipidemia   . Hypertensive heart disease   . Lumbar disc disease   . Osteoarthritis   . Permanent atrial fibrillation ([place])    a. 07/2012 Echo: EF 55-60%, no rwma, mildly dil RA/LA.  Marland Kitchen Syncope    a. 07/2012 - unclear etiology-->resulted in right tib/fib fx req ORIF/intramedullary nail of tibia.    Past Surgical History:  Procedure Laterality Date  . ANKLE SURGERY     right ankle  . TIBIA IM NAIL INSERTION Right 07/21/2012   Procedure: INTRAMEDULLARY (IM) NAIL TIBIAL;  Surgeon: Johnny Bridge, MD;  Location: [facility];  Service: Orthopedics;  Laterality: Right;  . TOTAL HIP ARTHROPLASTY     left  . TOTAL HIP ARTHROPLASTY Right 07/11/2015   Procedure: TOTAL HIP ARTHROPLASTY;  Surgeon: Marchia Bond, MD;  Location: [facility];  Service: Orthopedics;  Laterality: Right;    Current Outpatient Prescriptions  Medication Sig Dispense Refill  .  Acetaminophen (TYLENOL PO) Take 1 tablet by mouth daily as needed (pain). Pt unsure of strength    . apixaban (ELIQUIS) 5 MG TABS tablet Take 1 tablet (5 mg total) by mouth 2 (two) times daily. 180 tablet 3  . losartan (COZAAR) 50 MG tablet Take 1 tablet (50 mg total) by mouth 2 (two) times daily. 180 tablet 3  . Melatonin 3 MG TABS Take 1 tablet by mouth at bedtime as needed (sleep).    . sennosides-docusate sodium (SENOKOT-S) 8.6-50 MG tablet Take 2 tablets by mouth daily. (Patient taking differently: Take 2 tablets by mouth as needed. ) 30 tablet 1   No current facility-administered medications for this visit.     Allergies:   Patient has no known allergies.   Social History:  The patient  reports that he has never smoked. He has never used smokeless tobacco. He reports that he drinks about 7.2 oz of alcohol per week . He reports that he does not use drugs.   Family History:  The patient's family history includes Cancer in his mother; Hyperlipidemia in his sister; Hypertension in his sister.  ROS:  Please see the history of present illness. All other systems are reviewed and otherwise negative.   PHYSICAL EXAM:  VS:  BP (!) 156/90   Pulse (!) 108   Ht 5\' 10"  (1.778 m)   Wt 185 lb (83.9 kg)  BMI 26.54 kg/m  BMI: Body mass index is 26.54 kg/m. Well nourished, well developed, in no acute distress  HEENT: normocephalic, atraumatic  Neck: no JVD, carotid bruits or masses Cardiac:   IRRR; no significant murmurs, no rubs, or gallops Lungs:  clear to auscultation bilaterally, no wheezing, rhonchi or rales  Abd: soft, nontender MS: no deformity, age appropriate atrophy Ext: no  edema  Skin: warm and dry, no rash Neuro:  No gross deficits appreciated Psych: euthymic mood, full affect   EKG:  Done today and reviewed by myself is AFib, 108bpm, iRBBB, nonspecific ST/T changes, appears similar to previous 05/15/15: TEE Study Conclusions - Left ventricle: The cavity size was normal.  There was mild   concentric hypertrophy. Systolic function was normal. The   estimated ejection fraction was in the range of 55% to 60%. Wall   motion was normal; there were no regional wall motion   abnormalities. - Mitral valve: There was mild regurgitation. - Left atrium: The atrium was severely dilated. 63mm - Right atrium: The atrium was severely dilated. - Tricuspid valve: There was trivial regurgitation.  Recent Labs: 05/13/2015: ALT 16; B Natriuretic Peptide 251.3; Magnesium 1.9; TSH 4.185 07/18/2015: BUN 17; Creatinine 0.9; Potassium 4.4; Sodium 140 10/09/2015: Hemoglobin 14.1; Platelets 295.0  03/10/2016: Cholesterol 212; HDL 54.10; LDL Cholesterol 137; Total CHOL/HDL Ratio 4; Triglycerides 106.0; VLDL 21.2   CrCl cannot be calculated (Patient's most recent lab result is older than the maximum 21 days allowed.).   Wt Readings from Last 3 Encounters:  03/20/16 185 lb (83.9 kg)  03/10/16 180 lb 8 oz (81.9 kg)  10/09/15 180 lb 12 oz (82 kg)     Other studies reviewed: Additional studies/records reviewed today include: summarized above  ASSESSMENT AND PLAN:  1.  Transient complete heart block Resolved off of metoprolol No symptoms of bradycardia (Dr. Rayann Heman has mentioned if recurrent syncope/pre-syncope, will need PPM implant)  2.  Permanent atrial fibrillation controlled CHADS2VASC is 3, on Eliquis Labs today  3.  HTN Discussed minimizing sodium, increased physical activity to his tolerance    Disposition: F/u with Dr. Rayann Heman in 6 months, sooner if needed  Current medicines are reviewed at length with the patient today.  The patient did not have any concerns regarding medicines.  Haywood Lasso, PA-C 11-09-2015 10:49 AM     [facility] [place] [address] [phone] (office)  [phone] (fax)

## 2016-03-20 ENCOUNTER — Ambulatory Visit (INDEPENDENT_AMBULATORY_CARE_PROVIDER_SITE_OTHER): Payer: Medicare Other | Admitting: Physician Assistant

## 2016-03-20 ENCOUNTER — Encounter: Payer: Self-pay | Admitting: Physician Assistant

## 2016-03-20 VITALS — BP 156/90 | HR 108 | Ht 70.0 in | Wt 185.0 lb

## 2016-03-20 DIAGNOSIS — I4821 Permanent atrial fibrillation: Secondary | ICD-10-CM

## 2016-03-20 DIAGNOSIS — I10 Essential (primary) hypertension: Secondary | ICD-10-CM

## 2016-03-20 DIAGNOSIS — Z5181 Encounter for therapeutic drug level monitoring: Secondary | ICD-10-CM | POA: Diagnosis not present

## 2016-03-20 DIAGNOSIS — I482 Chronic atrial fibrillation: Secondary | ICD-10-CM

## 2016-03-20 LAB — BASIC METABOLIC PANEL
BUN: 17 mg/dL (ref 7–25)
CHLORIDE: 102 mmol/L (ref 98–110)
CO2: 27 mmol/L (ref 20–31)
CREATININE: 1.04 mg/dL (ref 0.70–1.11)
Calcium: 9.9 mg/dL (ref 8.6–10.3)
GLUCOSE: 105 mg/dL — AB (ref 65–99)
POTASSIUM: 4.9 mmol/L (ref 3.5–5.3)
Sodium: 139 mmol/L (ref 135–146)

## 2016-03-20 LAB — CBC
HEMATOCRIT: 48.5 % (ref 38.5–50.0)
Hemoglobin: 16.3 g/dL (ref 13.2–17.1)
MCH: 32.8 pg (ref 27.0–33.0)
MCHC: 33.6 g/dL (ref 32.0–36.0)
MCV: 97.6 fL (ref 80.0–100.0)
MPV: 10.2 fL (ref 7.5–12.5)
PLATELETS: 264 10*3/uL (ref 140–400)
RBC: 4.97 MIL/uL (ref 4.20–5.80)
RDW: 15.2 % — AB (ref 11.0–15.0)
WBC: 7.1 10*3/uL (ref 3.8–10.8)

## 2016-03-20 NOTE — Patient Instructions (Addendum)
Medication Instructions:   .Your physician recommends that you continue on your current medications as directed. Please refer to the Current Medication list given to you today.   If you need a refill on your cardiac medications before your next appointment, please call your pharmacy.  Labwork: BMET AND CBC    Testing/Procedures: NONE ORDERED  TODAY    Follow-Up:  Your physician wants you to follow-up in: 6 MONTHS You will receive a reminder letter in the mail two months in advance. If you don't receive a letter, please call our office to schedule the follow-up appointment.     Any Other Special Instructions Will Be Listed Below (If Applicable).

## 2016-03-21 NOTE — Addendum Note (Signed)
Addended by: Claude Manges on: 03/21/2016 11:32 AM   Modules accepted: Orders

## 2016-04-15 ENCOUNTER — Ambulatory Visit (INDEPENDENT_AMBULATORY_CARE_PROVIDER_SITE_OTHER): Payer: Medicare Other | Admitting: Internal Medicine

## 2016-04-15 ENCOUNTER — Encounter: Payer: Self-pay | Admitting: Internal Medicine

## 2016-04-15 DIAGNOSIS — M159 Polyosteoarthritis, unspecified: Secondary | ICD-10-CM | POA: Diagnosis not present

## 2016-04-15 DIAGNOSIS — I482 Chronic atrial fibrillation, unspecified: Secondary | ICD-10-CM

## 2016-04-15 DIAGNOSIS — I119 Hypertensive heart disease without heart failure: Secondary | ICD-10-CM

## 2016-04-15 DIAGNOSIS — Z96641 Presence of right artificial hip joint: Secondary | ICD-10-CM | POA: Diagnosis not present

## 2016-04-15 NOTE — Patient Instructions (Signed)
Please start the weight exercising in the gym about 3 times a week.  You should try to walk or do other aerobic exercise just about every day.

## 2016-04-15 NOTE — Progress Notes (Signed)
Pre visit review using our clinic review tool, if applicable. No additional management support is needed unless otherwise documented below in the visit note. 

## 2016-04-15 NOTE — Progress Notes (Signed)
Subjective:    Patient ID: Martin Conner, male    DOB: 06-30-1927, 83 y.o.   MRN: [id number]  HPI Here for follow up of chronic health conditions  Feels just about back to normal from the hip surgery Drives, shops, does his housework as needed No restrictions of note  No palpitations Recent cardiology evaluation was okay No chest pain or SOB No regular exercise No edema No dizziness or syncope  No sig arthritis or back pain  Current Outpatient Prescriptions on File Prior to Visit  Medication Sig Dispense Refill  . Acetaminophen (TYLENOL PO) Take 1 tablet by mouth daily as needed (pain). Pt unsure of strength    . apixaban (ELIQUIS) 5 MG TABS tablet Take 1 tablet (5 mg total) by mouth 2 (two) times daily. 180 tablet 3  . losartan (COZAAR) 50 MG tablet Take 1 tablet (50 mg total) by mouth 2 (two) times daily. 180 tablet 3  . Melatonin 3 MG TABS Take 1 tablet by mouth at bedtime as needed (sleep).    . sennosides-docusate sodium (SENOKOT-S) 8.6-50 MG tablet Take 2 tablets by mouth daily. (Patient taking differently: Take 2 tablets by mouth as needed. ) 30 tablet 1   No current facility-administered medications on file prior to visit.     No Known Allergies  Past Medical History:  Diagnosis Date  . Fracture of femoral neck, right ([place]) 07/10/2015   s/p mechanical fall  . Hyperlipidemia   . Hypertensive heart disease   . Lumbar disc disease   . Osteoarthritis   . Permanent atrial fibrillation ([place])    a. 07/2012 Echo: EF 55-60%, no rwma, mildly dil RA/LA.  Marland Kitchen Syncope    a. 07/2012 - unclear etiology-->resulted in right tib/fib fx req ORIF/intramedullary nail of tibia.    Past Surgical History:  Procedure Laterality Date  . ANKLE SURGERY     right ankle  . TIBIA IM NAIL INSERTION Right 07/21/2012   Procedure: INTRAMEDULLARY (IM) NAIL TIBIAL;  Surgeon: Johnny Bridge, MD;  Location: [facility];  Service: Orthopedics;  Laterality: Right;  . TOTAL HIP ARTHROPLASTY     left  .  TOTAL HIP ARTHROPLASTY Right 07/11/2015   Procedure: TOTAL HIP ARTHROPLASTY;  Surgeon: Marchia Bond, MD;  Location: [facility];  Service: Orthopedics;  Laterality: Right;    Family History  Problem Relation Age of Onset  . Cancer Mother   . Hyperlipidemia Sister   . Hypertension Sister     Social History   Social History  . Marital status: Married    Spouse name: N/A  . Number of children: 2  . Years of education: N/A   Occupational History  . retired  At [employer]   Social History Main Topics  . Smoking status: Never Smoker  . Smokeless tobacco: Never Used  . Alcohol use 7.2 oz/week    12 Cans of beer per week     Comment: 2 beers several x/wk and occas cocktail on top of it.  . Drug use: No  . Sexual activity: No   Other Topics Concern  . Not on file   Social History Narrative   No living will   Requests that wife make decisions for him if needed   Would accept resuscitation but no prolonged artificial ventilation   Would not want tube feeds if cognitively unaware   Review of Systems Appetite is good Weight up slightly Sleeps well--uses melatonin only occasionally Bowels are fine    Objective:  Physical Exam  Constitutional: He appears well-nourished. No distress.  Neck: No thyromegaly present.  Cardiovascular: Normal rate.  Exam reveals no gallop.   Irregular Feet warm without palpable pulses  Pulmonary/Chest: Effort normal and breath sounds normal. No respiratory distress. He has no wheezes. He has no rales.  Abdominal: Soft. There is no tenderness.  Musculoskeletal: He exhibits no edema or tenderness.  Lymphadenopathy:    He has no cervical adenopathy.  Psychiatric: He has a normal mood and affect. His behavior is normal.          Assessment & Plan:

## 2016-04-15 NOTE — Assessment & Plan Note (Signed)
Rate control is fine Continues on the apixaban

## 2016-04-15 NOTE — Assessment & Plan Note (Signed)
Finally just about back to baseline

## 2016-04-15 NOTE — Assessment & Plan Note (Signed)
BP Readings from Last 3 Encounters:  04/15/16 138/90  03/20/16 (!) 156/90  03/10/16 (!) 150/100   Good control now on the losartan

## 2016-04-15 NOTE — Assessment & Plan Note (Signed)
Improved No meds now Discussed regular exercise/resistance work

## 2016-07-22 ENCOUNTER — Other Ambulatory Visit: Payer: Self-pay | Admitting: Internal Medicine

## 2016-10-27 ENCOUNTER — Encounter: Payer: Self-pay | Admitting: Internal Medicine

## 2016-11-12 ENCOUNTER — Encounter: Payer: Self-pay | Admitting: Internal Medicine

## 2016-11-12 ENCOUNTER — Ambulatory Visit (INDEPENDENT_AMBULATORY_CARE_PROVIDER_SITE_OTHER): Payer: Medicare Other | Admitting: Internal Medicine

## 2016-11-12 VITALS — BP 124/80 | HR 76 | Ht 70.0 in | Wt 191.0 lb

## 2016-11-12 DIAGNOSIS — I482 Chronic atrial fibrillation: Secondary | ICD-10-CM | POA: Diagnosis not present

## 2016-11-12 DIAGNOSIS — I10 Essential (primary) hypertension: Secondary | ICD-10-CM | POA: Diagnosis not present

## 2016-11-12 DIAGNOSIS — I4821 Permanent atrial fibrillation: Secondary | ICD-10-CM

## 2016-11-12 NOTE — Progress Notes (Signed)
   PCP: Venia Carbon, MD Primary EP: Martin Conner is a 83 y.o. male who presents today for routine electrophysiology followup.  Since last being seen in our clinic, the patient reports doing very well.  He is very active for his age.  Today, he denies symptoms of palpitations, chest pain, shortness of breath,  lower extremity edema, dizziness, presyncope, or syncope.  The patient is otherwise without complaint today.   Past Medical History:  Diagnosis Date  . Fracture of femoral neck, right ([place]) 07/10/2015   s/p mechanical fall  . Hyperlipidemia   . Hypertensive heart disease   . Lumbar disc disease   . Osteoarthritis   . Permanent atrial fibrillation ([place])    a. 07/2012 Echo: EF 55-60%, no rwma, mildly dil RA/LA.  Marland Kitchen Syncope    a. 07/2012 - unclear etiology-->resulted in right tib/fib fx req ORIF/intramedullary nail of tibia.   Past Surgical History:  Procedure Laterality Date  . ANKLE SURGERY     right ankle  . TIBIA IM NAIL INSERTION Right 07/21/2012   Procedure: INTRAMEDULLARY (IM) NAIL TIBIAL;  Surgeon: Johnny Bridge, MD;  Location: [facility];  Service: Orthopedics;  Laterality: Right;  . TOTAL HIP ARTHROPLASTY     left  . TOTAL HIP ARTHROPLASTY Right 07/11/2015   Procedure: TOTAL HIP ARTHROPLASTY;  Surgeon: Marchia Bond, MD;  Location: [facility];  Service: Orthopedics;  Laterality: Right;    ROS- all systems are reviewed and negatives except as per HPI above  Current Outpatient Prescriptions  Medication Sig Dispense Refill  . Acetaminophen (TYLENOL PO) Take 1 tablet by mouth daily as needed (pain). Pt unsure of strength    . apixaban (ELIQUIS) 5 MG TABS tablet Take 1 tablet (5 mg total) by mouth 2 (two) times daily. 180 tablet 3  . cholecalciferol (VITAMIN D) 1000 units tablet Take 1,000 Units by mouth daily.    Marland Kitchen losartan (COZAAR) 50 MG tablet TAKE 1 TABLET TWICE A DAY 180 tablet 1  . sennosides-docusate sodium (SENOKOT-S) 8.6-50 MG tablet Take 2 tablets by mouth  daily. (Patient taking differently: Take 2 tablets by mouth as needed. ) 30 tablet 1   No current facility-administered medications for this visit.     Physical Exam: Vitals:   11/12/16 1613  BP: 124/80  Pulse: 76  SpO2: 98%  Weight: 191 lb (86.6 kg)  Height: 5\' 10"  (1.778 m)    GEN- The patient is well appearing, alert and oriented x 3 today.   Head- normocephalic, atraumatic Eyes-  Sclera clear, conjunctiva pink Ears- hearing intact Oropharynx- clear Lungs- Clear to ausculation bilaterally, normal work of breathing Heart- irregular rate and rhythm, no murmurs, rubs or gallops, PMI not laterally displaced GI- soft, NT, ND, + BS Extremities- no clubbing, cyanosis, or edema  EKG tracing ordered today is personally reviewed and shows afib, V rate 91 bpm, QRS 102 msec, nonspecific ST/T changes  Assessment and Plan:  1. Permanent afib Rate controlled chads2vasc score is 3.  On anticoagulation  2. HTN Stable No change required today  3. Transient complete heart block Over a year ago, resolved off of metoprolol No further workup planned If returns, would likely require pacing  Return to see EP PA in a year  Martin Grayer MD, [facility] 11/12/2016 4:30 PM

## 2016-11-12 NOTE — Patient Instructions (Signed)
Medication Instructions:  Your physician recommends that you continue on your current medications as directed. Please refer to the Current Medication list given to you today.   Labwork: None ordered   Testing/Procedures: None ordered   Follow-Up: Your physician wants you to follow-up in: 12 months with Renee Ursuy, PA You will receive a reminder letter in the mail two months in advance. If you don't receive a letter, please call our office to schedule the follow-up appointment.       Any Other Special Instructions Will Be Listed Below (If Applicable).     If you need a refill on your cardiac medications before your next appointment, please call your pharmacy.   

## 2017-01-18 ENCOUNTER — Other Ambulatory Visit: Payer: Self-pay | Admitting: Internal Medicine

## 2017-02-23 ENCOUNTER — Other Ambulatory Visit: Payer: Self-pay | Admitting: Internal Medicine

## 2017-02-23 NOTE — Telephone Encounter (Signed)
Approved: okay x 1 year 

## 2017-04-17 ENCOUNTER — Encounter: Payer: Self-pay | Admitting: Internal Medicine

## 2017-04-17 ENCOUNTER — Ambulatory Visit (INDEPENDENT_AMBULATORY_CARE_PROVIDER_SITE_OTHER): Payer: Medicare Other | Admitting: Internal Medicine

## 2017-04-17 VITALS — BP 104/60 | HR 79 | Temp 97.4°F | Ht 70.0 in | Wt 185.0 lb

## 2017-04-17 DIAGNOSIS — I482 Chronic atrial fibrillation, unspecified: Secondary | ICD-10-CM

## 2017-04-17 DIAGNOSIS — I442 Atrioventricular block, complete: Secondary | ICD-10-CM | POA: Diagnosis not present

## 2017-04-17 DIAGNOSIS — I119 Hypertensive heart disease without heart failure: Secondary | ICD-10-CM

## 2017-04-17 DIAGNOSIS — Z0000 Encounter for general adult medical examination without abnormal findings: Secondary | ICD-10-CM

## 2017-04-17 DIAGNOSIS — Z23 Encounter for immunization: Secondary | ICD-10-CM

## 2017-04-17 DIAGNOSIS — R413 Other amnesia: Secondary | ICD-10-CM | POA: Diagnosis not present

## 2017-04-17 LAB — T4, FREE: Free T4: 0.82 ng/dL (ref 0.60–1.60)

## 2017-04-17 LAB — COMPREHENSIVE METABOLIC PANEL
ALT: 17 U/L (ref 0–53)
AST: 18 U/L (ref 0–37)
Albumin: 4.1 g/dL (ref 3.5–5.2)
Alkaline Phosphatase: 64 U/L (ref 39–117)
BILIRUBIN TOTAL: 0.8 mg/dL (ref 0.2–1.2)
BUN: 18 mg/dL (ref 6–23)
CALCIUM: 9.5 mg/dL (ref 8.4–10.5)
CHLORIDE: 99 meq/L (ref 96–112)
CO2: 27 mEq/L (ref 19–32)
CREATININE: 1.15 mg/dL (ref 0.40–1.50)
GFR: 63.87 mL/min (ref >60.00)
GLUCOSE: 101 mg/dL — AB (ref 70–99)
Potassium: 4 mEq/L (ref 3.5–5.1)
Sodium: 136 mEq/L (ref 135–145)
Total Protein: 8.4 g/dL — ABNORMAL HIGH (ref 6.0–8.3)

## 2017-04-17 LAB — CBC
HEMATOCRIT: 44.9 % (ref 39.0–52.0)
Hemoglobin: 15 g/dL (ref 13.0–17.0)
MCHC: 33.5 g/dL (ref 30.0–36.0)
MCV: 97.7 fl (ref 78.0–100.0)
PLATELETS: 284 10*3/uL (ref 150.0–400.0)
RBC: 4.59 Mil/uL (ref 4.22–5.81)
RDW: 14.6 % (ref 11.5–15.5)
WBC: 6.7 10*3/uL (ref 4.0–10.5)

## 2017-04-17 LAB — VITAMIN B12: VITAMIN B 12: 534 pg/mL (ref 211–911)

## 2017-04-17 NOTE — Assessment & Plan Note (Signed)
Transient and hasn't recurred since off beta blocker Plan is for pacer if recurs

## 2017-04-17 NOTE — Assessment & Plan Note (Signed)
BP Readings from Last 3 Encounters:  04/17/17 104/60  11/12/16 124/80  04/15/16 138/90   Good control

## 2017-04-17 NOTE — Assessment & Plan Note (Signed)
Rate is okay On the eliquis

## 2017-04-17 NOTE — Assessment & Plan Note (Signed)
Consistent with early dementia Will check labs Discussed with wife ---warning signs for further intervention, limiting driving, etc

## 2017-04-17 NOTE — Progress Notes (Signed)
Hearing Screening (Inadequate exam)   Method: Audiometry   125Hz  250Hz  500Hz  1000Hz  2000Hz  3000Hz  4000Hz  6000Hz  8000Hz   Right ear:   0 40 40  0    Left ear:   0 40 0  0      Visual Acuity Screening   Right eye Left eye Both eyes  Without correction: 20/40 20/40 20/40   With correction:

## 2017-04-17 NOTE — Addendum Note (Signed)
Addended by: Pilar Grammes on: 04/17/2017 11:48 AM   Modules accepted: Orders

## 2017-04-17 NOTE — Assessment & Plan Note (Signed)
I have personally reviewed the Medicare Annual Wellness questionnaire and have noted 1. The patient's medical and social history 2. Their use of alcohol, tobacco or illicit drugs 3. Their current medications and supplements 4. The patient's functional ability including ADL's, fall risks, home safety risks and hearing or visual             impairment. 5. Diet and physical activities 6. Evidence for depression or mood disorders  The patients weight, height, BMI and visual acuity have been recorded in the chart I have made referrals, counseling and provided education to the patient based review of the above and I have provided the pt with a written personalized care plan for preventive services.  I have provided you with a copy of your personalized plan for preventive services. Please take the time to review along with your updated medication list.  Update pneumovax Yearly flu vaccine No cancer screening

## 2017-04-17 NOTE — Progress Notes (Signed)
Subjective:    Patient ID: Martin Conner, male    DOB: 1927/05/28, 84 y.o.   MRN: [id number]  HPI Here with wife for Medicare wellness visit and follow up of chronic health conditions Reviewed form and advanced directives Reviewed other doctors Occasional whiskey 1-2 some days No tobacco No regular exercise Walks with cane if uneven ground--wife does most of the driving.  He does ADLs--wife most instrumental ADLs. He does the bills Vision is okay He feels his hearing is fine---test shows some deficits No depression or anhedonia No falls He notes no memory issues--but wife noted some confusion at times  No palpitations  No chest pain No SOB No dizziness or syncope No edema  Current Outpatient Medications on File Prior to Visit  Medication Sig Dispense Refill  . Acetaminophen (TYLENOL PO) Take 1 tablet by mouth daily as needed (pain). Pt unsure of strength    . losartan (COZAAR) 50 MG tablet TAKE 1 TABLET TWICE A DAY 180 tablet 1  . sennosides-docusate sodium (SENOKOT-S) 8.6-50 MG tablet Take 2 tablets by mouth daily. (Patient taking differently: Take 2 tablets by mouth as needed. ) 30 tablet 1  . cholecalciferol (VITAMIN D) 1000 units tablet Take 1,000 Units by mouth daily.    Marland Kitchen ELIQUIS 5 MG TABS tablet TAKE 1 TABLET TWICE A DAY (Patient not taking: Reported on 04/17/2017) 180 tablet 3   No current facility-administered medications on file prior to visit.     No Known Allergies  Past Medical History:  Diagnosis Date  . Fracture of femoral neck, right ([place]) 07/10/2015   s/p mechanical fall  . Hyperlipidemia   . Hypertensive heart disease   . Lumbar disc disease   . Osteoarthritis   . Permanent atrial fibrillation ([place])    a. 07/2012 Echo: EF 55-60%, no rwma, mildly dil RA/LA.  Marland Kitchen Syncope    a. 07/2012 - unclear etiology-->resulted in right tib/fib fx req ORIF/intramedullary nail of tibia.    Past Surgical History:  Procedure Laterality Date  . ANKLE SURGERY     right  ankle  . TIBIA IM NAIL INSERTION Right 07/21/2012   Procedure: INTRAMEDULLARY (IM) NAIL TIBIAL;  Surgeon: Johnny Bridge, MD;  Location: [facility];  Service: Orthopedics;  Laterality: Right;  . TOTAL HIP ARTHROPLASTY     left  . TOTAL HIP ARTHROPLASTY Right 07/11/2015   Procedure: TOTAL HIP ARTHROPLASTY;  Surgeon: Marchia Bond, MD;  Location: [facility];  Service: Orthopedics;  Laterality: Right;    Family History  Problem Relation Age of Onset  . Cancer Mother   . Hyperlipidemia Sister   . Hypertension Sister     Social History   Socioeconomic History  . Marital status: Married    Spouse name: Not on file  . Number of children: 2  . Years of education: Not on file  . Highest education level: Not on file  Social Needs  . Financial resource strain: Not on file  . Food insecurity - worry: Not on file  . Food insecurity - inability: Not on file  . Transportation needs - medical: Not on file  . Transportation needs - non-medical: Not on file  Occupational History  . Occupation: retired     Fish farm manager: [employer]  Tobacco Use  . Smoking status: Never Smoker  . Smokeless tobacco: Never Used  Substance and Sexual Activity  . Alcohol use: Yes    Alcohol/week: 7.2 oz    Types: 12 Cans of beer per week  Comment: 2 beers several x/wk and occas cocktail on top of it.  . Drug use: No  . Sexual activity: No  Other Topics Concern  . Not on file  Social History Narrative   Not sure about living will   Requests that wife make decisions for him if needed   Would accept resuscitation but no prolonged artificial ventilation   Would not want tube feeds if cognitively unaware   Review of Systems No headache Appetite is fine Weight stable Sleeps well Wears seat belt Teeth okay--keeps up with dentist No skin rash or suspicious lesions Bowels are fine--wife notes blood in bowl about a month ago (he didn't remember). No recurrence Voids okay--flow is slow at first. Nocturia x 1 No sig back or  joint pain No heartburn or dysphagia Wife notes cough and congested--only in past few days    Objective:   Physical Exam  Constitutional: He appears well-developed. No distress.  HENT:  Mouth/Throat: Oropharynx is clear and moist. No oropharyngeal exudate.  Neck: No thyromegaly present.  Cardiovascular: Normal rate and normal heart sounds. Exam reveals no gallop.  No murmur heard. Faint pulse right foot, absent left irregular  Pulmonary/Chest: Effort normal and breath sounds normal. No respiratory distress. He has no wheezes. He has no rales.  Abdominal: Soft. There is no tenderness.  Lymphadenopathy:    He has no cervical adenopathy.  Neurological: He is alert.  "February 19019" President-- "Zena Amos, ----Sheela Stack--- ?" [phone] D-l-o-w Recall 2/3  Skin:  Redness in feet but no ulcers  Psychiatric: He has a normal mood and affect. His behavior is normal.          Assessment & Plan:

## 2017-07-18 ENCOUNTER — Other Ambulatory Visit: Payer: Self-pay | Admitting: Internal Medicine

## 2017-10-15 ENCOUNTER — Other Ambulatory Visit: Payer: Self-pay | Admitting: Podiatry

## 2017-10-15 ENCOUNTER — Ambulatory Visit (INDEPENDENT_AMBULATORY_CARE_PROVIDER_SITE_OTHER): Payer: Medicare Other

## 2017-10-15 ENCOUNTER — Ambulatory Visit (INDEPENDENT_AMBULATORY_CARE_PROVIDER_SITE_OTHER): Payer: Medicare Other | Admitting: Podiatry

## 2017-10-15 ENCOUNTER — Encounter: Payer: Self-pay | Admitting: Podiatry

## 2017-10-15 VITALS — Resp 16

## 2017-10-15 DIAGNOSIS — M779 Enthesopathy, unspecified: Secondary | ICD-10-CM

## 2017-10-15 DIAGNOSIS — M79671 Pain in right foot: Secondary | ICD-10-CM

## 2017-10-15 DIAGNOSIS — M79609 Pain in unspecified limb: Secondary | ICD-10-CM | POA: Diagnosis not present

## 2017-10-15 DIAGNOSIS — L84 Corns and callosities: Secondary | ICD-10-CM | POA: Diagnosis not present

## 2017-10-15 DIAGNOSIS — D689 Coagulation defect, unspecified: Secondary | ICD-10-CM

## 2017-10-15 DIAGNOSIS — B351 Tinea unguium: Secondary | ICD-10-CM

## 2017-10-15 NOTE — Progress Notes (Signed)
   Subjective:    Patient ID: Martin Conner, male    DOB: November 08, 1927, 84 y.o.   MRN: [id number]  HPI    Review of Systems  All other systems reviewed and are negative.      Objective:   Physical Exam        Assessment & Plan:

## 2017-10-15 NOTE — Progress Notes (Signed)
Subjective:   Patient ID: Martin Conner, male   DOB: 84 y.o.   MRN: [id number]   HPI Patient presents with a painful spot on the outside of the right foot that is been inflamed very thin skin and lesions underneath his feet are sore and severe nail disease that affects 1-5 both feet difficult to cut and become painful with shoe gear.  Patient does not smoke and likes to be active   Review of Systems  All other systems reviewed and are negative.       Objective:  Physical Exam  Constitutional: He appears well-developed and well-nourished.  Cardiovascular: Intact distal pulses.  Pulmonary/Chest: Effort normal.  Musculoskeletal: Normal range of motion.  Neurological: He is alert.  Skin: Skin is warm.  Nursing note and vitals reviewed.   I noted that his circulatory status has weak pulses PT DP but upon questioning no history of intense claudication symptoms.  He has thick yellow brittle nailbeds 1-5 both feet that are incurvated and sore and has an area of inflammation around the fifth metatarsal base right with keratotic lesion formation and lesions underneath the first and third metatarsal right that are painful.  Patient has paperthin skin which is complicating factor and very dry skin and had adequate digital perfusion and is well oriented     Assessment:  At risk patient with mycotic nail infection who is also on blood thinner and has lesion formation and nail disease     Plan:  H&P education concerning condition rendered.  For the outside of the right I did discuss that also inflammatory may require a cortisone injection but at this point I did go ahead and I carefully debrided the lesion with no ulceration noted and debrided lesions plantar aspect right with no iatrogenic bleeding.  I debrided nailbeds 1-5 both feet and advised him on routine care given the chronic nature of his condition and the fact that he is on blood thinner and is high risk as far as taking care of this  himself

## 2018-04-20 ENCOUNTER — Ambulatory Visit (INDEPENDENT_AMBULATORY_CARE_PROVIDER_SITE_OTHER): Payer: Medicare Other | Admitting: Internal Medicine

## 2018-04-20 ENCOUNTER — Encounter: Payer: Self-pay | Admitting: Internal Medicine

## 2018-04-20 VITALS — BP 130/84 | HR 109 | Temp 97.4°F | Ht 70.0 in | Wt 179.0 lb

## 2018-04-20 DIAGNOSIS — I482 Chronic atrial fibrillation, unspecified: Secondary | ICD-10-CM

## 2018-04-20 DIAGNOSIS — I10 Essential (primary) hypertension: Secondary | ICD-10-CM

## 2018-04-20 DIAGNOSIS — R413 Other amnesia: Secondary | ICD-10-CM

## 2018-04-20 DIAGNOSIS — F0390 Unspecified dementia without behavioral disturbance: Secondary | ICD-10-CM | POA: Diagnosis not present

## 2018-04-20 DIAGNOSIS — I442 Atrioventricular block, complete: Secondary | ICD-10-CM

## 2018-04-20 LAB — COMPREHENSIVE METABOLIC PANEL
ALT: 20 U/L (ref 0–53)
AST: 23 U/L (ref 0–37)
Albumin: 3.6 g/dL (ref 3.5–5.2)
Alkaline Phosphatase: 49 U/L (ref 39–117)
BUN: 11 mg/dL (ref 6–23)
CHLORIDE: 100 meq/L (ref 96–112)
CO2: 26 meq/L (ref 19–32)
CREATININE: 1 mg/dL (ref 0.40–1.50)
Calcium: 10 mg/dL (ref 8.4–10.5)
GFR: 70.44 mL/min (ref >60.00)
GLUCOSE: 99 mg/dL (ref 70–99)
Potassium: 4.5 mEq/L (ref 3.5–5.1)
SODIUM: 137 meq/L (ref 135–145)
Total Bilirubin: 0.7 mg/dL (ref 0.2–1.2)
Total Protein: 9.2 g/dL — ABNORMAL HIGH (ref 6.0–8.3)

## 2018-04-20 LAB — SEDIMENTATION RATE: Sed Rate: 91 mm/hr — ABNORMAL HIGH (ref 0–20)

## 2018-04-20 LAB — CBC
HEMATOCRIT: 42.2 % (ref 39.0–52.0)
HEMOGLOBIN: 14.4 g/dL (ref 13.0–17.0)
MCHC: 34 g/dL (ref 30.0–36.0)
MCV: 100.9 fl — ABNORMAL HIGH (ref 78.0–100.0)
PLATELETS: 201 10*3/uL (ref 150.0–400.0)
RBC: 4.18 Mil/uL — ABNORMAL LOW (ref 4.22–5.81)
RDW: 15 % (ref 11.5–15.5)
WBC: 3.5 10*3/uL — ABNORMAL LOW (ref 4.0–10.5)

## 2018-04-20 LAB — VITAMIN B12: Vitamin B-12: 923 pg/mL — ABNORMAL HIGH (ref 211–911)

## 2018-04-20 LAB — T4, FREE: Free T4: 0.91 ng/dL (ref 0.60–1.60)

## 2018-04-20 MED ORDER — APIXABAN 2.5 MG PO TABS: 2.5000 mg | ORAL_TABLET | Freq: Two times a day (BID) | ORAL | 3 refills | Status: DC

## 2018-04-20 NOTE — Assessment & Plan Note (Signed)
BP Readings from Last 3 Encounters:  04/20/18 130/84  04/17/17 104/60  11/12/16 124/80   Good control

## 2018-04-20 NOTE — Assessment & Plan Note (Signed)
Rate is on high side Doesn't tolerate beta blockers and I am concerned about CCBs also No Rx other than restarting eliquis (will decrease to 2.5mg  after they use up 5mg )

## 2018-04-20 NOTE — Patient Instructions (Signed)
Please restart the apixaban (eliquis). You can use up the 5mg  doses you have (twice a day), then change to 2.5mg  twice a day. Get up by 9AM daily and have breakfast.

## 2018-04-20 NOTE — Assessment & Plan Note (Signed)
Has had falls but no clear recurrence since off beta blocker Precludes rate control of a fib unless pacemaker put in

## 2018-04-20 NOTE — Progress Notes (Signed)
Subjective:    Patient ID: Martin Conner, male    DOB: 1927/04/12, 85 y.o.   MRN: [id number]  HPI Here with wife and daughter for Medicare wellness They have significant concerns--so will postpone this and review concerns  Sleeping a lot. Will go to sleep 9PM and not get up till 4PM Will fall asleep in chair at other times He denies feeling tired Will get up to void and then return to bed Denies depression Does enjoy watching golf and basketball Doesn't go out much  Doesn't drive Wife does all instrumental ADLs---always that way Does ADLs and is generally continent  No memory problems per him Wife notes some concerns--- forgets the day, repeats himself No clear tasks he has given up (other than driving) Doing some bird watching Not stable when first getting up--some arm weakness Trouble with steps---has fallen a few times. No injuries Uses cane or walker  No chest pain No SOB No palpitations ---hasn't felt the atrial fib in some time Stopped the eliquis---even though they still have it (he decided not to take it any more) No dizziness or syncope  Bad cough/congestion for a few days Wife giving mucinex DM nyquil x 4 nights  Current Outpatient Medications on File Prior to Visit  Medication Sig Dispense Refill  . Acetaminophen (TYLENOL PO) Take 1 tablet by mouth daily as needed (pain). Pt unsure of strength    . losartan (COZAAR) 50 MG tablet TAKE 1 TABLET TWICE A DAY 180 tablet 3  . sennosides-docusate sodium (SENOKOT-S) 8.6-50 MG tablet Take 2 tablets by mouth daily. (Patient taking differently: Take 2 tablets by mouth as needed. ) 30 tablet 1   No current facility-administered medications on file prior to visit.     No Known Allergies  Past Medical History:  Diagnosis Date  . Fracture of femoral neck, right ([facility]) 07/10/2015   s/p mechanical fall  . Hyperlipidemia   . Hypertensive heart disease   . Lumbar disc disease   . Osteoarthritis   . Permanent atrial  fibrillation    a. 07/2012 Echo: EF 55-60%, no rwma, mildly dil RA/LA.  Marland Kitchen Syncope    a. 07/2012 - unclear etiology-->resulted in right tib/fib fx req ORIF/intramedullary nail of tibia.    Past Surgical History:  Procedure Laterality Date  . ANKLE SURGERY     right ankle  . TIBIA IM NAIL INSERTION Right 07/21/2012   Procedure: INTRAMEDULLARY (IM) NAIL TIBIAL;  Surgeon: Johnny Bridge, MD;  Location: [facility];  Service: Orthopedics;  Laterality: Right;  . TOTAL HIP ARTHROPLASTY     left  . TOTAL HIP ARTHROPLASTY Right 07/11/2015   Procedure: TOTAL HIP ARTHROPLASTY;  Surgeon: Marchia Bond, MD;  Location: [facility];  Service: Orthopedics;  Laterality: Right;    Family History  Problem Relation Age of Onset  . Cancer Mother   . Hyperlipidemia Sister   . Hypertension Sister     Social History   Socioeconomic History  . Marital status: Married    Spouse name: Not on file  . Number of children: 2  . Years of education: Not on file  . Highest education level: Not on file  Occupational History  . Occupation: retired     Fish farm manager: AT [employer]  . Financial resource strain: Not on file  . Food insecurity:    Worry: Not on file    Inability: Not on file  . Transportation needs:    Medical: Not on file  Non-medical: Not on file  Tobacco Use  . Smoking status: Never Smoker  . Smokeless tobacco: Never Used  Substance and Sexual Activity  . Alcohol use: Yes    Alcohol/week: 12.0 standard drinks    Types: 12 Cans of beer per week    Comment: 2 beers several x/wk and occas cocktail on top of it.  . Drug use: No  . Sexual activity: Never  Lifestyle  . Physical activity:    Days per week: Not on file    Minutes per session: Not on file  . Stress: Not on file  Relationships  . Social connections:    Talks on phone: Not on file    Gets together: Not on file    Attends religious service: Not on file    Active member of club or organization: Not on file    Attends meetings  of clubs or organizations: Not on file    Relationship status: Not on file  . Intimate partner violence:    Fear of current or ex partner: Not on file    Emotionally abused: Not on file    Physically abused: Not on file    Forced sexual activity: Not on file  Other Topics Concern  . Not on file  Social History Narrative   Not sure about living will   Requests that wife make decisions for him if needed   Would accept resuscitation but no prolonged artificial ventilation   Would not want tube feeds if cognitively unaware   Review of Systems Constipated at times--needs suppository prn Appetite is not great but he does take boost/ensure daily Weight down a bit but not over longer time No back or joint pain No tremor Writing is shaky and bigger (more elongated)    Objective:   Physical Exam  Constitutional: He appears well-developed. No distress.  Neck: No thyromegaly present.  Cardiovascular: Normal rate and normal heart sounds. Exam reveals no gallop.  No murmur heard. Feet warm but no pulses Irregular--rate ~90  Respiratory: Effort normal and breath sounds normal. No respiratory distress. He has no wheezes. He has no rales.  GI: Soft. There is no abdominal tenderness.  Musculoskeletal:        General: No tenderness or edema.  Lymphadenopathy:    He has no cervical adenopathy.  Neurological: He is alert.  "March, 2000" President--- "Zena Amos, [place]" [phone] [id number] Recall 1/3  Slightly shuffling gait but not Parkinsonian No focal weakness No tremor Normal tone  Skin:  Flaky skin on feet 2 scabbed lesions right foot           Assessment & Plan:

## 2018-04-20 NOTE — Assessment & Plan Note (Signed)
Pattern not consistent with AD Likely vascular Will check labs Not depressed Discussed getting up at set hour

## 2018-04-21 ENCOUNTER — Other Ambulatory Visit: Payer: Self-pay | Admitting: Internal Medicine

## 2018-04-21 DIAGNOSIS — E8809 Other disorders of plasma-protein metabolism, not elsewhere classified: Secondary | ICD-10-CM

## 2018-04-21 NOTE — Progress Notes (Signed)
Serum p[

## 2018-04-26 ENCOUNTER — Other Ambulatory Visit: Payer: Medicare Other

## 2018-04-26 DIAGNOSIS — E8809 Other disorders of plasma-protein metabolism, not elsewhere classified: Secondary | ICD-10-CM

## 2018-04-29 ENCOUNTER — Ambulatory Visit
Admission: RE | Admit: 2018-04-29 | Discharge: 2018-04-29 | Disposition: A | Discharge status: Home / Self Care | Payer: Medicare Other | Source: Ambulatory Visit | Attending: Internal Medicine | Admitting: Internal Medicine

## 2018-04-29 DIAGNOSIS — R413 Other amnesia: Secondary | ICD-10-CM

## 2018-04-29 NOTE — Progress Notes (Signed)
IFE interpretation is pending

## 2018-04-30 ENCOUNTER — Other Ambulatory Visit: Payer: Self-pay | Admitting: Internal Medicine

## 2018-04-30 DIAGNOSIS — D472 Monoclonal gammopathy: Secondary | ICD-10-CM

## 2018-04-30 LAB — PROTEIN ELECTROPHORESIS, SERUM, WITH REFLEX
ALPHA 1: 0.3 g/dL (ref 0.2–0.3)
Abnormal Protein Band1: 2.5 g/dL — ABNORMAL HIGH
Albumin ELP: 3.2 g/dL — ABNORMAL LOW (ref 3.8–4.8)
Alpha 2: 0.8 g/dL (ref 0.5–0.9)
BETA 2: 0.3 g/dL (ref 0.2–0.5)
BETA GLOBULIN: 0.3 g/dL — AB (ref 0.4–0.6)
Gamma Globulin: 2.9 g/dL — ABNORMAL HIGH (ref 0.8–1.7)
TOTAL PROTEIN: 7.9 g/dL (ref 6.1–8.1)

## 2018-04-30 LAB — IFE INTERPRETATION: Immunofix Electr Int: DETECTED

## 2018-05-03 ENCOUNTER — Telehealth: Payer: Self-pay | Admitting: Hematology and Oncology

## 2018-05-03 ENCOUNTER — Encounter: Payer: Self-pay | Admitting: Hematology and Oncology

## 2018-05-03 NOTE — Telephone Encounter (Signed)
New hem appt has been scheduled for the pt to see Dr. Lindi Adie on 3/2 at 1pm. Pt aware to arrive 30 minutes early. Letter mailed.

## 2018-05-17 ENCOUNTER — Ambulatory Visit: Payer: Medicare Other | Admitting: Internal Medicine

## 2018-05-17 ENCOUNTER — Encounter: Payer: Self-pay | Admitting: Internal Medicine

## 2018-05-17 VITALS — BP 132/72 | HR 60 | Temp 97.2°F | Ht 70.0 in | Wt 178.0 lb

## 2018-05-17 DIAGNOSIS — D472 Monoclonal gammopathy: Secondary | ICD-10-CM

## 2018-05-17 DIAGNOSIS — I482 Chronic atrial fibrillation, unspecified: Secondary | ICD-10-CM

## 2018-05-17 DIAGNOSIS — F0390 Unspecified dementia without behavioral disturbance: Secondary | ICD-10-CM | POA: Diagnosis not present

## 2018-05-17 NOTE — Assessment & Plan Note (Signed)
Mild Doing well now that he is getting up early and regularly

## 2018-05-17 NOTE — Assessment & Plan Note (Signed)
Likely needs observation only Has hematology visit

## 2018-05-17 NOTE — Assessment & Plan Note (Signed)
Rate is okay Back on the eliquis

## 2018-05-17 NOTE — Progress Notes (Signed)
Subjective:    Patient ID: Martin Conner, male    DOB: May 23, 1927, 85 y.o.   MRN: [id number]  HPI Here for follow up of fatigue With wife and daughter  Is getting up at [place] now Will have breakfast---then watch TV or the birds ([place])  Back on the eliquis Finishing out the 5mg   No chest pain No SOB  Current Outpatient Medications on File Prior to Visit  Medication Sig Dispense Refill  . Acetaminophen (TYLENOL PO) Take 1 tablet by mouth daily as needed (pain). Pt unsure of strength    . apixaban (ELIQUIS) 2.5 MG TABS tablet Take 1 tablet (2.5 mg total) by mouth 2 (two) times daily. 180 tablet 3  . losartan (COZAAR) 50 MG tablet TAKE 1 TABLET TWICE A DAY 180 tablet 3  . sennosides-docusate sodium (SENOKOT-S) 8.6-50 MG tablet Take 2 tablets by mouth daily. (Patient taking differently: Take 2 tablets by mouth as needed. ) 30 tablet 1   No current facility-administered medications on file prior to visit.     No Known Allergies  Past Medical History:  Diagnosis Date  . Fracture of femoral neck, right ([facility]) 07/10/2015   s/p mechanical fall  . Hyperlipidemia   . Hypertensive heart disease   . Lumbar disc disease   . Osteoarthritis   . Permanent atrial fibrillation    a. 07/2012 Echo: EF 55-60%, no rwma, mildly dil RA/LA.  Marland Kitchen Syncope    a. 07/2012 - unclear etiology-->resulted in right tib/fib fx req ORIF/intramedullary nail of tibia.    Past Surgical History:  Procedure Laterality Date  . ANKLE SURGERY     right ankle  . TIBIA IM NAIL INSERTION Right 07/21/2012   Procedure: INTRAMEDULLARY (IM) NAIL TIBIAL;  Surgeon: Johnny Bridge, MD;  Location: [facility];  Service: Orthopedics;  Laterality: Right;  . TOTAL HIP ARTHROPLASTY     left  . TOTAL HIP ARTHROPLASTY Right 07/11/2015   Procedure: TOTAL HIP ARTHROPLASTY;  Surgeon: Marchia Bond, MD;  Location: [facility];  Service: Orthopedics;  Laterality: Right;    Family History  Problem Relation Age of Onset  . Cancer  Mother   . Hyperlipidemia Sister   . Hypertension Sister     Social History   Socioeconomic History  . Marital status: Married    Spouse name: Not on file  . Number of children: 2  . Years of education: Not on file  . Highest education level: Not on file  Occupational History  . Occupation: retired     Fish farm manager: AT [employer]  . Financial resource strain: Not on file  . Food insecurity:    Worry: Not on file    Inability: Not on file  . Transportation needs:    Medical: Not on file    Non-medical: Not on file  Tobacco Use  . Smoking status: Never Smoker  . Smokeless tobacco: Never Used  Substance and Sexual Activity  . Alcohol use: Yes    Alcohol/week: 12.0 standard drinks    Types: 12 Cans of beer per week    Comment: 2 beers several x/wk and occas cocktail on top of it.  . Drug use: No  . Sexual activity: Never  Lifestyle  . Physical activity:    Days per week: Not on file    Minutes per session: Not on file  . Stress: Not on file  Relationships  . Social connections:    Talks on phone: Not on file  Gets together: Not on file    Attends religious service: Not on file    Active member of club or organization: Not on file    Attends meetings of clubs or organizations: Not on file    Relationship status: Not on file  . Intimate partner violence:    Fear of current or ex partner: Not on file    Emotionally abused: Not on file    Physically abused: Not on file    Forced sexual activity: Not on file  Other Topics Concern  . Not on file  Social History Narrative   Not sure about living will   Requests that wife make decisions for him if needed   Would accept resuscitation but no prolonged artificial ventilation   Would not want tube feeds if cognitively unaware   Review of Systems Appetite is some better Content---mood is okay    Objective:   Physical Exam  Constitutional: He appears well-developed. No distress.  Neck: No thyromegaly present.    Cardiovascular: Normal rate and normal heart sounds. Exam reveals no gallop.  No murmur heard. Slightly irregular  Respiratory: Effort normal and breath sounds normal. No respiratory distress. He has no wheezes. He has no rales.  Lymphadenopathy:    He has no cervical adenopathy.  Psychiatric: He has a normal mood and affect. His behavior is normal.           Assessment & Plan:

## 2018-05-20 NOTE — Progress Notes (Signed)
[facility] CONSULT NOTE  Patient Care Team: Venia Carbon, MD as PCP - Bethena Roys, MD as Consulting Physician (Cardiology) Marchia Bond, MD as Consulting Physician (Orthopedic Surgery)  CHIEF COMPLAINTS/PURPOSE OF CONSULTATION: Newly diagnosed monoclonal gammopathy  HISTORY OF PRESENTING ILLNESS:  Martin Conner 85 y.o. male is here because of recent diagnosis of monoclonal gammopathy. He presents to the clinic today with his wife. His most recent labs from 04/26/18 show: M protein 6.5 and IGA kappa monoclonal protein detected. Labs from 04/20/18 show: B-12 923, WBC 3.5, Hg 14.4, creatinine 1.00, calcium 10.0. He reports this is the first knowledge he has of monoclonal gammopathy and he denies any history of cancer. His wife notes he has been fatigued recently and has been sleeping about 15 hours per day. He uses a cane to ambulate.   I reviewed her records extensively and collaborated the history with the patient.  MEDICAL HISTORY:  Past Medical History:  Diagnosis Date  . Fracture of femoral neck, right ([facility]) 07/10/2015   s/p mechanical fall  . Hyperlipidemia   . Hypertensive heart disease   . Lumbar disc disease   . Osteoarthritis   . Permanent atrial fibrillation    a. 07/2012 Echo: EF 55-60%, no rwma, mildly dil RA/LA.  Marland Kitchen Syncope    a. 07/2012 - unclear etiology-->resulted in right tib/fib fx req ORIF/intramedullary nail of tibia.    SURGICAL HISTORY: Past Surgical History:  Procedure Laterality Date  . ANKLE SURGERY     right ankle  . TIBIA IM NAIL INSERTION Right 07/21/2012   Procedure: INTRAMEDULLARY (IM) NAIL TIBIAL;  Surgeon: Johnny Bridge, MD;  Location: [facility];  Service: Orthopedics;  Laterality: Right;  . TOTAL HIP ARTHROPLASTY     left  . TOTAL HIP ARTHROPLASTY Right 07/11/2015   Procedure: TOTAL HIP ARTHROPLASTY;  Surgeon: Marchia Bond, MD;  Location: [facility];  Service: Orthopedics;  Laterality: Right;    SOCIAL HISTORY: Social History    Socioeconomic History  . Marital status: Married    Spouse name: Not on file  . Number of children: 2  . Years of education: Not on file  . Highest education level: Not on file  Occupational History  . 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 Financial resource strain: Not on file  . Food insecurity:    Worry: Not on file    Inability: Not on file  . Transportation needs:    Medical: Not on file    Non-medical: Not on file  Tobacco Use  . Smoking status: Never Smoker  . Smokeless tobacco: Never Used  Substance and Sexual Activity  . Alcohol use: Yes    Alcohol/week: 12.0 standard drinks    Types: 12 Cans of beer per week    Comment: 2 beers several x/wk and occas cocktail on top of it.  . Drug use: No  . Sexual activity: Never  Lifestyle  . Physical activity:    Days per week: Not on file    Minutes per session: Not on file  . Stress: Not on file  Relationships  . Social connections:    Talks on phone: Not on file    Gets together: Not on file    Attends religious service: Not on file    Active member of club or organization: Not on file    Attends meetings of clubs or organizations: Not on file    Relationship status: Not on  file  . Intimate partner violence:    Fear of current or ex partner: Not on file    Emotionally abused: Not on file    Physically abused: Not on file    Forced sexual activity: Not on file  Other Topics Concern  . Not on file  Social History Narrative   Not sure about living will   Requests that wife make decisions for him if needed   Would accept resuscitation but no prolonged artificial ventilation   Would not want tube feeds if cognitively unaware    FAMILY HISTORY: Family History  Problem Relation Age of Onset  . Cancer Mother   . Hyperlipidemia Sister   . Hypertension Sister     ALLERGIES:  has No Known Allergies.  MEDICATIONS:  Current Outpatient Medications  Medication Sig Dispense Refill  .  Acetaminophen (TYLENOL PO) Take 1 tablet by mouth daily as needed (pain). Pt unsure of strength    . apixaban (ELIQUIS) 2.5 MG TABS tablet Take 1 tablet (2.5 mg total) by mouth 2 (two) times daily. 180 tablet 3  . losartan (COZAAR) 50 MG tablet TAKE 1 TABLET TWICE A DAY 180 tablet 3  . sennosides-docusate sodium (SENOKOT-S) 8.6-50 MG tablet Take 2 tablets by mouth daily. (Patient taking differently: Take 2 tablets by mouth as needed. ) 30 tablet 1   No current facility-administered medications for this visit.     REVIEW OF SYSTEMS:   Constitutional: Denies fevers, chills or abnormal night sweats (+) fatigue Eyes: Denies blurriness of vision, double vision or watery eyes Ears, nose, mouth, throat, and face: Denies mucositis or sore throat Respiratory: Denies cough, dyspnea or wheezes Cardiovascular: Denies palpitation, chest discomfort or lower extremity swelling Gastrointestinal:  Denies nausea, heartburn or change in bowel habits Skin: Denies abnormal skin rashes Lymphatics: Denies new lymphadenopathy or easy bruising Neurological:Denies numbness, tingling or new weaknesses Behavioral/Psych: Mood is stable, no new changes  All other systems were reviewed with the patient and are negative.  PHYSICAL EXAMINATION: ECOG PERFORMANCE STATUS: 1 - Symptomatic but completely ambulatory  Vitals:   05/25/18 1243  BP: (!) 148/88  Pulse: 98  Resp: 16  Temp: (!) 97.4 F (36.3 C)  SpO2: 99%   Filed Weights   05/25/18 1243  Weight: 180 lb 3.2 oz (81.7 kg)    GENERAL:alert, no distress and comfortable SKIN: skin color, texture, turgor are normal, no rashes or significant lesions EYES: normal, conjunctiva are pink and non-injected, sclera clear OROPHARYNX:no exudate, no erythema and lips, buccal mucosa, and tongue normal  NECK: supple, thyroid normal size, non-tender, without nodularity LYMPH:  no palpable lymphadenopathy in the cervical, axillary or inguinal LUNGS: clear to auscultation  and percussion with normal breathing effort HEART: regular rate & rhythm and no murmurs and no lower extremity edema ABDOMEN:abdomen soft, non-tender and normal bowel sounds Musculoskeletal:no cyanosis of digits and no clubbing  PSYCH: alert & oriented x 3 with fluent speech NEURO: no focal motor/sensory deficits  LABORATORY DATA:  I have reviewed the data as listed Lab Results  Component Value Date   WBC 3.5 (L) 04/20/2018   HGB 14.4 04/20/2018   HCT 42.2 04/20/2018   MCV 100.9 (H) 04/20/2018   PLT 201.0 04/20/2018   Lab Results  Component Value Date   NA 137 04/20/2018   K 4.5 04/20/2018   CL 100 04/20/2018   CO2 26 04/20/2018    RADIOGRAPHIC STUDIES: I have personally reviewed the radiological reports and agreed with the findings in the report.  ASSESSMENT AND PLAN:  MGUS (monoclonal gammopathy of unknown significance) 04/26/2018: M protein: 2.5 g, IgG kappa Patient had work-up for elevated total protein in the serum which revealed the elevated monoclonal protein. Lab review: Hemoglobin 14.4, creatinine 1, calcium 10 g, serum albumin 3.6  Counseling: I discussed with the patient the spectrum of disorders from MGUS to multiple myeloma. We discussed the role of plasma cells in producing immunoglobulins. We discussed structure of immunoglobulins on how they make up the heavy chains and the light chains. The light chains are Kappa and lambda. I discussed the difference between MGUS and multiple myeloma. MGUS is characterized by elevation monoclonal protein without any end organ damage. Multiple myeloma is associated with elevation monoclonal protein and end organ damage (hypercalcemia, renal dysfunction, anemia, bone lytic lesions ) along with a bone marrow showing greater than 10% plasma cells.  Workup recommended: 1. Bone survey 2. Bone marrow biopsy 3.  Free light chain assay 4.  Beta-2 microglobulin  Return to clinic after the bone marrow biopsy to discuss the report. Plan  to do a bone marrow biopsy on 06/03/2018   All questions were answered. The patient knows to call the clinic with any problems, questions or concerns.   Nicholas Lose, MD 05/25/2018   I, Cloyde Reams Dorshimer, am acting as scribe for Nicholas Lose, MD.  I have reviewed the above documentation for accuracy and completeness, and I agree with the above.

## 2018-05-25 ENCOUNTER — Inpatient Hospital Stay: Payer: Medicare Other | Attending: Hematology and Oncology | Admitting: Hematology and Oncology

## 2018-05-25 ENCOUNTER — Telehealth: Payer: Self-pay | Admitting: Hematology and Oncology

## 2018-05-25 ENCOUNTER — Other Ambulatory Visit: Payer: Self-pay

## 2018-05-25 DIAGNOSIS — I119 Hypertensive heart disease without heart failure: Secondary | ICD-10-CM | POA: Diagnosis not present

## 2018-05-25 DIAGNOSIS — Z79899 Other long term (current) drug therapy: Secondary | ICD-10-CM | POA: Insufficient documentation

## 2018-05-25 DIAGNOSIS — E785 Hyperlipidemia, unspecified: Secondary | ICD-10-CM | POA: Insufficient documentation

## 2018-05-25 DIAGNOSIS — D472 Monoclonal gammopathy: Secondary | ICD-10-CM | POA: Diagnosis not present

## 2018-05-25 DIAGNOSIS — I4821 Permanent atrial fibrillation: Secondary | ICD-10-CM | POA: Insufficient documentation

## 2018-05-25 DIAGNOSIS — Z7901 Long term (current) use of anticoagulants: Secondary | ICD-10-CM | POA: Insufficient documentation

## 2018-05-25 DIAGNOSIS — M1990 Unspecified osteoarthritis, unspecified site: Secondary | ICD-10-CM | POA: Insufficient documentation

## 2018-05-25 NOTE — Assessment & Plan Note (Signed)
04/26/2018: M protein: 2.5 g, IgG kappa Patient had work-up for elevated total protein in the serum which revealed the elevated monoclonal protein. Lab review: Hemoglobin 14.4, creatinine 1, calcium 10 g, serum albumin 3.6  Counseling: I discussed with the patient the spectrum of disorders from MGUS to multiple myeloma. We discussed the role of plasma cells in producing immunoglobulins. We discussed structure of immunoglobulins on how they make up the heavy chains and the light chains. The light chains are Kappa and lambda. I discussed the difference between MGUS and multiple myeloma. MGUS is characterized by elevation monoclonal protein without any end organ damage. Multiple myeloma is associated with elevation monoclonal protein and end organ damage (hypercalcemia, renal dysfunction, anemia, bone lytic lesions ) along with a bone marrow showing greater than 10% plasma cells.  Workup recommended: 1. Bone survey 2. Bone marrow biopsy 3.  Free light chain assay 4.  Beta-2 microglobulin  Return to clinic after the bone marrow biopsy to discuss the report.

## 2018-05-25 NOTE — Telephone Encounter (Signed)
Called regarding 730 change for 3/12

## 2018-06-03 ENCOUNTER — Ambulatory Visit (HOSPITAL_COMMUNITY)
Admission: RE | Admit: 2018-06-03 | Discharge: 2018-06-03 | Disposition: A | Discharge status: Home / Self Care | Payer: Medicare Other | Source: Ambulatory Visit | Attending: Hematology and Oncology | Admitting: Hematology and Oncology

## 2018-06-03 ENCOUNTER — Inpatient Hospital Stay: Payer: Medicare Other

## 2018-06-03 ENCOUNTER — Other Ambulatory Visit: Payer: Self-pay

## 2018-06-03 ENCOUNTER — Inpatient Hospital Stay (HOSPITAL_BASED_OUTPATIENT_CLINIC_OR_DEPARTMENT_OTHER): Payer: Medicare Other

## 2018-06-03 VITALS — BP 156/89 | HR 88 | Temp 98.5°F | Resp 17

## 2018-06-03 DIAGNOSIS — D472 Monoclonal gammopathy: Secondary | ICD-10-CM | POA: Diagnosis not present

## 2018-06-03 LAB — CBC WITH DIFFERENTIAL/PLATELET
Abs Immature Granulocytes: 0.01 10*3/uL (ref 0.00–0.07)
Basophils Absolute: 0 10*3/uL (ref 0.0–0.1)
Basophils Relative: 1 %
Eosinophils Absolute: 0 10*3/uL (ref 0.0–0.5)
Eosinophils Relative: 1 %
HCT: 37.4 % — ABNORMAL LOW (ref 39.0–52.0)
HEMOGLOBIN: 12.5 g/dL — AB (ref 13.0–17.0)
Immature Granulocytes: 0 %
Lymphocytes Relative: 28 %
Lymphs Abs: 1.1 10*3/uL (ref 0.7–4.0)
MCH: 33.5 pg (ref 26.0–34.0)
MCHC: 33.4 g/dL (ref 30.0–36.0)
MCV: 100.3 fL — ABNORMAL HIGH (ref 80.0–100.0)
Monocytes Absolute: 0.2 10*3/uL (ref 0.1–1.0)
Monocytes Relative: 5 %
Neutro Abs: 2.4 10*3/uL (ref 1.7–7.7)
Neutrophils Relative %: 65 %
Platelets: 145 10*3/uL — ABNORMAL LOW (ref 150–400)
RBC: 3.73 MIL/uL — AB (ref 4.22–5.81)
RDW: 15 % (ref 11.5–15.5)
WBC: 3.8 10*3/uL — AB (ref 4.0–10.5)
nRBC: 0 % (ref 0.0–0.2)

## 2018-06-03 MED ORDER — LIDOCAINE HCL 2 % IJ SOLN
INTRAMUSCULAR | Status: AC
  Filled 2018-06-03: qty 20

## 2018-06-03 NOTE — Progress Notes (Signed)
Bone marrow and aspiration performed by Dr. Lindi Adie with time out. Dc instructions were reviewed with patient and family. Site checked prior to DC and C,D,I and VSS.

## 2018-06-03 NOTE — Patient Instructions (Signed)

## 2018-06-04 LAB — KAPPA/LAMBDA LIGHT CHAINS
KAPPA FREE LGHT CHN: 1390.2 mg/L — AB (ref 3.3–19.4)
Kappa, lambda light chain ratio: 105.32 — ABNORMAL HIGH (ref 0.26–1.65)
LAMDA FREE LIGHT CHAINS: 13.2 mg/L (ref 5.7–26.3)

## 2018-06-04 LAB — BETA 2 MICROGLOBULIN, SERUM: Beta-2 Microglobulin: 3.1 mg/L — ABNORMAL HIGH (ref 0.6–2.4)

## 2018-06-07 LAB — MULTIPLE MYELOMA PANEL, SERUM
Albumin SerPl Elph-Mcnc: 3.5 g/dL (ref 2.9–4.4)
Albumin/Glob SerPl: 0.7 (ref 0.7–1.7)
Alpha 1: 0.3 g/dL (ref 0.0–0.4)
Alpha2 Glob SerPl Elph-Mcnc: 0.9 g/dL (ref 0.4–1.0)
B-Globulin SerPl Elph-Mcnc: 3.6 g/dL — ABNORMAL HIGH (ref 0.7–1.3)
Gamma Glob SerPl Elph-Mcnc: 0.5 g/dL (ref 0.4–1.8)
Globulin, Total: 5.2 g/dL — ABNORMAL HIGH (ref 2.2–3.9)
IgA: 2646 mg/dL — ABNORMAL HIGH (ref 61–437)
IgG (Immunoglobin G), Serum: 784 mg/dL (ref 700–1600)
IgM (Immunoglobulin M), Srm: 34 mg/dL (ref 15–143)
M Protein SerPl Elph-Mcnc: 2.6 g/dL — ABNORMAL HIGH
Total Protein ELP: 8.7 g/dL — ABNORMAL HIGH (ref 6.0–8.5)

## 2018-06-07 NOTE — Progress Notes (Signed)
INDICATION: Monoclonal gammopathy evaluation for myeloma  Brief examination was performed. ENT: adequate airway clearance Heart: regular rate and rhythm.No Murmurs Lungs: clear to auscultation, no wheezes, normal respiratory effort  Bone Marrow Biopsy and Aspiration Procedure Note   Informed consent was obtained and potential risks including bleeding, infection and pain were reviewed with the patient.  The patient's name, date of birth, identification, consent and allergies were verified prior to the start of procedure and time out was performed.  The left posterior iliac crest was chosen as the site of biopsy.  The skin was prepped with ChloraPrep.   8 cc of 1% lidocaine was used to provide local anaesthesia.   10 cc of bone marrow aspirate was obtained followed by 1cm biopsy.  Pressure was applied to the biopsy site and bandage was placed over the biopsy site. Patient was made to lie on the back for 15 mins prior to discharge.  The procedure was tolerated well. COMPLICATIONS: None BLOOD LOSS: none The patient was discharged home in stable condition with a 1 week follow up to review results.  Patient was provided with post bone marrow biopsy instructions and instructed to call if there was any bleeding or worsening pain.  Specimens sent for flow cytometry, cytogenetics and additional studies.  Signed Harriette Ohara, MD

## 2018-06-10 ENCOUNTER — Ambulatory Visit: Payer: Medicare Other | Admitting: Hematology and Oncology

## 2018-06-14 ENCOUNTER — Encounter (HOSPITAL_COMMUNITY): Payer: Self-pay | Admitting: Hematology and Oncology

## 2018-06-30 ENCOUNTER — Other Ambulatory Visit: Payer: Self-pay

## 2018-06-30 ENCOUNTER — Inpatient Hospital Stay: Payer: Medicare Other

## 2018-06-30 ENCOUNTER — Inpatient Hospital Stay: Payer: Medicare Other | Attending: Hematology and Oncology | Admitting: Hematology

## 2018-06-30 VITALS — BP 157/92 | HR 105 | Temp 98.4°F | Resp 16 | Ht 70.0 in | Wt 176.1 lb

## 2018-06-30 DIAGNOSIS — I4821 Permanent atrial fibrillation: Secondary | ICD-10-CM | POA: Insufficient documentation

## 2018-06-30 DIAGNOSIS — Z79899 Other long term (current) drug therapy: Secondary | ICD-10-CM | POA: Insufficient documentation

## 2018-06-30 DIAGNOSIS — C9000 Multiple myeloma not having achieved remission: Secondary | ICD-10-CM | POA: Insufficient documentation

## 2018-06-30 DIAGNOSIS — E785 Hyperlipidemia, unspecified: Secondary | ICD-10-CM | POA: Insufficient documentation

## 2018-06-30 DIAGNOSIS — I700 Atherosclerosis of aorta: Secondary | ICD-10-CM | POA: Insufficient documentation

## 2018-06-30 DIAGNOSIS — I119 Hypertensive heart disease without heart failure: Secondary | ICD-10-CM | POA: Diagnosis not present

## 2018-06-30 DIAGNOSIS — D649 Anemia, unspecified: Secondary | ICD-10-CM | POA: Diagnosis not present

## 2018-06-30 DIAGNOSIS — Z7901 Long term (current) use of anticoagulants: Secondary | ICD-10-CM | POA: Insufficient documentation

## 2018-06-30 LAB — CBC WITH DIFFERENTIAL/PLATELET
Abs Immature Granulocytes: 0.01 10*3/uL (ref 0.00–0.07)
Basophils Absolute: 0 10*3/uL (ref 0.0–0.1)
Basophils Relative: 1 %
Eosinophils Absolute: 0 10*3/uL (ref 0.0–0.5)
Eosinophils Relative: 1 %
HCT: 39.6 % (ref 39.0–52.0)
Hemoglobin: 13.3 g/dL (ref 13.0–17.0)
Immature Granulocytes: 0 %
Lymphocytes Relative: 38 %
Lymphs Abs: 1.3 10*3/uL (ref 0.7–4.0)
MCH: 33.3 pg (ref 26.0–34.0)
MCHC: 33.6 g/dL (ref 30.0–36.0)
MCV: 99 fL (ref 80.0–100.0)
Monocytes Absolute: 0.3 10*3/uL (ref 0.1–1.0)
Monocytes Relative: 7 %
Neutro Abs: 1.8 10*3/uL (ref 1.7–7.7)
Neutrophils Relative %: 53 %
Platelets: 160 10*3/uL (ref 150–400)
RBC: 4 MIL/uL — ABNORMAL LOW (ref 4.22–5.81)
RDW: 14.4 % (ref 11.5–15.5)
WBC: 3.4 10*3/uL — ABNORMAL LOW (ref 4.0–10.5)
nRBC: 0 % (ref 0.0–0.2)

## 2018-06-30 LAB — CMP (CANCER CENTER ONLY)
ALT: 20 U/L (ref 0–44)
AST: 21 U/L (ref 15–41)
Albumin: 3 g/dL — ABNORMAL LOW (ref 3.5–5.0)
Alkaline Phosphatase: 49 U/L (ref 38–126)
Anion gap: 13 (ref 5–15)
BUN: 16 mg/dL (ref 8–23)
CO2: 24 mmol/L (ref 22–32)
Calcium: 10 mg/dL (ref 8.9–10.3)
Chloride: 101 mmol/L (ref 98–111)
Creatinine: 1.25 mg/dL — ABNORMAL HIGH (ref 0.61–1.24)
GFR, Est AFR Am: 59 mL/min — ABNORMAL LOW (ref >60)
GFR, Estimated: 51 mL/min — ABNORMAL LOW (ref >60)
Glucose, Bld: 97 mg/dL (ref 70–99)
Potassium: 4.4 mmol/L (ref 3.5–5.1)
Sodium: 138 mmol/L (ref 135–145)
Total Bilirubin: 0.7 mg/dL (ref 0.3–1.2)
Total Protein: 9.8 g/dL — ABNORMAL HIGH (ref 6.5–8.1)

## 2018-06-30 LAB — LACTATE DEHYDROGENASE: LDH: <120 U/L (ref 98–192)

## 2018-06-30 NOTE — Progress Notes (Signed)
HEMATOLOGY/ONCOLOGY CONSULTATION NOTE  Date of Service: 06/30/2018  Patient Care Team: Venia Carbon, MD as PCP - Bethena Roys, MD as Consulting Physician (Cardiology) Marchia Bond, MD as Consulting Physician (Orthopedic Surgery)  CHIEF COMPLAINTS/PURPOSE OF CONSULTATION:  Multiple Myeloma  HISTORY OF PRESENTING ILLNESS:   Martin Conner is a wonderful 85 y.o. male who has been referred to [place] by Dr. Nicholas Lose for evaluation and management of his newly diagnosed Multiple Myeloma. The pt reports that he is doing well overall.   The pt reports that he has not felt differently recently as compared to his baseline 6-12 months ago. The pt notes that he had a physical in late January 2020 with his PCP and routine labs which revealed a concern for high protein. This was evaluated further and the pt was referred to my colleague Dr. Lindi Adie who has completed initial work up.  The pt denies problems passing urine, bowel abnormalities, or neuropathy. He also denies back pains.  The pt notes that he walks very little, ambulates with a cane or a walker, but can get around in his home well. He lives with his wife in his own home. The pt denies having falls, and uses a cane/walker to prevent falls. The pt notes that he has two children, and notes that they are not very involved with his medical decision making.  The pt denies heart, liver, lung, thyroid problems. He does have Afib however, and takes Eliquis.  Most recent lab results (06/03/18) of CBC w/diff is as follows: all values are WNL except for WBC at 3.8k, RBC at 3.73, HGB at 12.5, HCT at 37.4, MCV at 100.3, PLT at 145k. 06/03/18 MMP revealed all values WNL except for IgA at 2646, Total Protein at 8.7, B-globulin at 3.6, M Protein at 2.6g, and Globulin Total at 5.2. 06/03/18 Beta-2 microglobulin at 3.1 06/03/18 SFLC revealed Kappa at 1390.2, and K:L ratio at 105.32  On review of systems, pt reports stable energy levels, and denies  urination abnormalities, back pain, changes in bowel habits, abdominal pains, and any other symptoms.   On PMHx the pt reports Afib. On Social Hx the pt reports infrequent single alcoholic beverage. Denies ever smoking cigarettes. 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 On Family Hx the pt denies significant concerns.   MEDICAL HISTORY:  Past Medical History:  Diagnosis Date  . Fracture of femoral neck, right ([place]) 07/10/2015   s/p mechanical fall  . Hyperlipidemia   . Hypertensive heart disease   . Lumbar disc disease   . Osteoarthritis   . Permanent atrial fibrillation    a. 07/2012 Echo: EF 55-60%, no rwma, mildly dil RA/LA.  Marland Kitchen Syncope    a. 07/2012 - unclear etiology-->resulted in right tib/fib fx req ORIF/intramedullary nail of tibia.    SURGICAL HISTORY: Past Surgical History:  Procedure Laterality Date  . ANKLE SURGERY     right ankle  . TIBIA IM NAIL INSERTION Right 07/21/2012   Procedure: INTRAMEDULLARY (IM) NAIL TIBIAL;  Surgeon: Johnny Bridge, MD;  Location: [facility];  Service: Orthopedics;  Laterality: Right;  . TOTAL HIP ARTHROPLASTY     left  . TOTAL HIP ARTHROPLASTY Right 07/11/2015   Procedure: TOTAL HIP ARTHROPLASTY;  Surgeon: Marchia Bond, MD;  Location: [facility];  Service: Orthopedics;  Laterality: Right;    SOCIAL HISTORY: Social History   Socioeconomic History  . Marital status: Married    Spouse name: Not on file  .  Number of children: 2  . Years of education: Not on file  . Highest education level: Not on file  Occupational History  . Occupation: retired     Fish farm manager: [employer]  . Financial resource strain: Not on file  . Food insecurity:    Worry: Not on file    Inability: Not on file  . Transportation needs:    Medical: Not on file    Non-medical: Not on file  Tobacco Use  . Smoking status: Never Smoker  . Smokeless tobacco: Never Used  Substance and Sexual Activity  . Alcohol use: Yes    Alcohol/week: 12.0  standard drinks    Types: 12 Cans of beer per week    Comment: 2 beers several x/wk and occas cocktail on top of it.  . Drug use: No  . Sexual activity: Never  Lifestyle  . Physical activity:    Days per week: Not on file    Minutes per session: Not on file  . Stress: Not on file  Relationships  . Social connections:    Talks on phone: Not on file    Gets together: Not on file    Attends religious service: Not on file    Active member of club or organization: Not on file    Attends meetings of clubs or organizations: Not on file    Relationship status: Not on file  . Intimate partner violence:    Fear of current or ex partner: Not on file    Emotionally abused: Not on file    Physically abused: Not on file    Forced sexual activity: Not on file  Other Topics Concern  . Not on file  Social History Narrative   Not sure about living will   Requests that wife make decisions for him if needed   Would accept resuscitation but no prolonged artificial ventilation   Would not want tube feeds if cognitively unaware    FAMILY HISTORY: Family History  Problem Relation Age of Onset  . Cancer Mother   . Hyperlipidemia Sister   . Hypertension Sister     ALLERGIES:  has No Known Allergies.  MEDICATIONS:  Current Outpatient Medications  Medication Sig Dispense Refill  . Acetaminophen (TYLENOL PO) Take 1 tablet by mouth daily as needed (pain). Pt unsure of strength    . apixaban (ELIQUIS) 2.5 MG TABS tablet Take 1 tablet (2.5 mg total) by mouth 2 (two) times daily. 180 tablet 3  . losartan (COZAAR) 50 MG tablet TAKE 1 TABLET TWICE A DAY 180 tablet 3  . sennosides-docusate sodium (SENOKOT-S) 8.6-50 MG tablet Take 2 tablets by mouth daily. (Patient taking differently: Take 2 tablets by mouth as needed. ) 30 tablet 1   No current facility-administered medications for this visit.     REVIEW OF SYSTEMS:    10 Point review of Systems was done is negative except as noted above.   PHYSICAL EXAMINATION: ECOG PERFORMANCE STATUS: 3 - Symptomatic, >50% confined to bed  . Vitals:   06/30/18 1015  BP: (!) 157/92  Pulse: (!) 105  Resp: 16  Temp: 98.4 F (36.9 C)  SpO2: 99%   Filed Weights   06/30/18 1015  Weight: 176 lb 1.6 oz (79.9 kg)   .Body mass index is 25.27 kg/m.  GENERAL:alert, in no acute distress and comfortable SKIN: no acute rashes, no significant lesions EYES: conjunctiva are pink and non-injected, sclera anicteric OROPHARYNX: MMM, no exudates, no oropharyngeal erythema or  ulceration NECK: supple, no JVD LYMPH:  no palpable lymphadenopathy in the cervical, axillary or inguinal regions LUNGS: clear to auscultation b/l with normal respiratory effort HEART: regular rate & rhythm ABDOMEN:  normoactive bowel sounds , non tender, not distended. Extremity: no pedal edema PSYCH: alert & oriented x 3 with fluent speech NEURO: no focal motor/sensory deficits  LABORATORY DATA:  I have reviewed the data as listed  . CBC Latest Ref Rng & Units 06/03/2018 04/20/2018 04/17/2017  WBC 4.0 - 10.5 K/uL 3.8(L) 3.5(L) 6.7  Hemoglobin 13.0 - 17.0 g/dL 12.5(L) 14.4 15.0  Hematocrit 39.0 - 52.0 % 37.4(L) 42.2 44.9  Platelets 150 - 400 K/uL 145(L) 201.0 284.0    . CMP Latest Ref Rng & Units 04/26/2018 04/20/2018 04/17/2017  Glucose 70 - 99 mg/dL - 99 101(H)  BUN 6 - 23 mg/dL - 11 18  Creatinine 0.40 - 1.50 mg/dL - 1.00 1.15  Sodium 135 - 145 mEq/L - 137 136  Potassium 3.5 - 5.1 mEq/L - 4.5 4.0  Chloride 96 - 112 mEq/L - 100 99  CO2 19 - 32 mEq/L - 26 27  Calcium 8.4 - 10.5 mg/dL - 10.0 9.5  Total Protein 6.1 - 8.1 g/dL 7.9 9.2(H) 8.4(H)  Total Bilirubin 0.2 - 1.2 mg/dL - 0.7 0.8  Alkaline Phos 39 - 117 U/L - 49 64  AST 0 - 37 U/L - 23 18  ALT 0 - 53 U/L - 20 17    06/03/18 BM Bx:    06/03/18 Cytogenetics:      RADIOGRAPHIC STUDIES: I have personally reviewed the radiological images as listed and agreed with the findings in the report. Dg Bone  Survey Met  Result Date: 06/03/2018 CLINICAL DATA:  Staging myeloma. EXAM: METASTATIC BONE SURVEY COMPARISON:  None. FINDINGS: Skull: Possible 8 mm lytic lesion within the parietal convexity of the calvarium. Cervical Spine: Normal bony mineralization. No focal lytic or blastic osseous lesion. Multilevel osteoarthritic changes. Thoracic Spine: Normal bony mineralization. No focal lytic or blastic osseous lesion. Multilevel osteoarthritic changes. Chest: Normal bony mineralization. No focal lytic or blastic osseous lesion. Calcific atherosclerotic disease of the aorta. Lumbar Spine: Normal bony mineralization. No focal lytic or blastic osseous lesion.Multilevel osteoarthritic changes. Pelvis: Normal bony mineralization. No focal lytic or blastic osseous lesion. Bilateral total hip arthroplasty. Right Upper Extremity: Normal bony mineralization. No focal lytic or blastic osseous lesion. Left Upper Extremity: Normal bony mineralization. No focal lytic or blastic osseous lesion. Right Lower Extremity: Normal bony mineralization. No focal lytic or blastic osseous lesion.IM nail fixation through healed distal tibial fracture. Osteoarthritic changes of the knee. Left Lower Extremity: Normal bony mineralization. No focal lytic or blastic osseous lesion. Osteoarthritic changes of the knee. IMPRESSION: Possible 8 mm lytic lesion versus subarachnoid granulation within the parietal convexity of the calvarium. No other suspicious lytic lesions seen within the skeleton. Electronically Signed   By: Fidela Salisbury M.D.   On: 06/03/2018 16:22    ASSESSMENT & PLAN:  85 y.o. male with  1. Multiple Myeloma 06/03/18 BM Biopsy revealed cellular marrow involved by plasma cell neoplasm (about 50%) 06/03/18 Cytogenetics revealed 54.5% of cells showed gain of ATM, and 51.5% of cells with loss of p53 06/03/18 Bone Survey revealed Possible 8 mm lytic lesion versus subarachnoid granulation within the parietal convexity of the  calvarium. No other suspicious lytic lesions seen within the skeleton.  PLAN: -Discussed patient's most recent labs from 06/03/18, M Protein at 2.6g with IgA kappa specificity. Some new mild anemia  with HGB at 12.5. -Discussed the new diagnosis of multiple myeloma, the natural history, the indications for treatment, and the intent of treatment, all in conversation with the patient's goals of care. Pt prefers a conservative treatment course, but does prefer treatment in general as opposed to purely pursuing comfort measures. -Discussed that the pt has a 17p mutation, which can be a predictor of treatment resistance and a possibly more aggressive course. -Discussed that the patient's myeloma is not causing hypercalcemia, nor impacting his kidney function much. He is having some new anemia likely related (which currently does not meet CRAB criteria). No overt concern for bone tumors. -Discussed that multiple myeloma is a treatable condition, but is not curable, and therefore the intent of treatment is to suppress the abnormal plasma cells.  -Discussed treatment with Velcade and Dexamethasone and possibly adding additional treatment like Revliimid if initial tolerance is displayed -Provided supplemental information -Will order blood tests today -Will discuss treatment options and indications further with the pt and his family on the phone in 1-2 weeks   Labs today PET/CT in 1 week Webex/telephone visit in 2 weeks   All of the patients questions were answered with apparent satisfaction. The patient knows to call the clinic with any problems, questions or concerns.  The total time spent in the appt was 45 minutes and more than 50% was on counseling and direct patient cares.    Sullivan Lone MD MS AAHIVMS [facility] Hematology/Oncology Physician [facility]  (Office):       [phone] (Work cell):  [phone] (Fax):           [phone]  06/30/2018 11:20 AM  I, Baldwin [place],  am acting as a scribe for Dr. Sullivan Lone.   .I have reviewed the above documentation for accuracy and completeness, and I agree with the above. Brunetta Genera MD

## 2018-07-01 ENCOUNTER — Telehealth: Payer: Self-pay | Admitting: Hematology

## 2018-07-01 LAB — MULTIPLE MYELOMA PANEL, SERUM
Albumin SerPl Elph-Mcnc: 3.6 g/dL (ref 2.9–4.4)
Albumin/Glob SerPl: 0.7 (ref 0.7–1.7)
Alpha 1: 0.3 g/dL (ref 0.0–0.4)
Alpha2 Glob SerPl Elph-Mcnc: 0.9 g/dL (ref 0.4–1.0)
B-Globulin SerPl Elph-Mcnc: 4.1 g/dL — ABNORMAL HIGH (ref 0.7–1.3)
Gamma Glob SerPl Elph-Mcnc: 0.6 g/dL (ref 0.4–1.8)
Globulin, Total: 5.9 g/dL — ABNORMAL HIGH (ref 2.2–3.9)
IgA: >6400 mg/dL — ABNORMAL HIGH (ref 61–437)
IgG (Immunoglobin G), Serum: 757 mg/dL (ref 603–1613)
IgM (Immunoglobulin M), Srm: 26 mg/dL (ref 15–143)
M Protein SerPl Elph-Mcnc: 3.1 g/dL — ABNORMAL HIGH
Total Protein ELP: 9.5 g/dL — ABNORMAL HIGH (ref 6.0–8.5)

## 2018-07-01 LAB — KAPPA/LAMBDA LIGHT CHAINS
Kappa free light chain: 1802.8 mg/L — ABNORMAL HIGH (ref 3.3–19.4)
Kappa, lambda light chain ratio: 150.23 — ABNORMAL HIGH (ref 0.26–1.65)
Lambda free light chains: 12 mg/L (ref 5.7–26.3)

## 2018-07-01 LAB — BETA 2 MICROGLOBULIN, SERUM: Beta-2 Microglobulin: 3.8 mg/L — ABNORMAL HIGH (ref 0.6–2.4)

## 2018-07-01 NOTE — Telephone Encounter (Signed)
Scheduled appt per 4/8 los.  Patient not aware of appt.  Left a VM stating his next visit, is a phone visit and also gave him the number to [facility] to schedule his scan appt, if he has not heard anything from them by Friday.

## 2018-07-13 NOTE — Progress Notes (Signed)
HEMATOLOGY/ONCOLOGY CONSULTATION NOTE  Date of Service: 07/14/2018  Patient Care Team: Venia Carbon, MD as PCP - Bethena Roys, MD as Consulting Physician (Cardiology) Marchia Bond, MD as Consulting Physician (Orthopedic Surgery)  CHIEF COMPLAINTS/PURPOSE OF CONSULTATION:  Multiple Myeloma  HISTORY OF PRESENTING ILLNESS:   Martin Conner is a wonderful 85 y.o. male who has been referred to [place] by Dr. Nicholas Lose for evaluation and management of his newly diagnosed Multiple Myeloma. The pt reports that he is doing well overall.   The pt reports that he has not felt differently recently as compared to his baseline 6-12 months ago. The pt notes that he had a physical in late January 2020 with his PCP and routine labs which revealed a concern for high protein. This was evaluated further and the pt was referred to my colleague Dr. Lindi Adie who has completed initial work up.  The pt denies problems passing urine, bowel abnormalities, or neuropathy. He also denies back pains.  The pt notes that he walks very little, ambulates with a cane or a walker, but can get around in his home well. He lives with his wife in his own home. The pt denies having falls, and uses a cane/walker to prevent falls. The pt notes that he has two children, and notes that they are not very involved with his medical decision making.  The pt denies heart, liver, lung, thyroid problems. He does have Afib however, and takes Eliquis.  Most recent lab results (06/03/18) of CBC w/diff is as follows: all values are WNL except for WBC at 3.8k, RBC at 3.73, HGB at 12.5, HCT at 37.4, MCV at 100.3, PLT at 145k. 06/03/18 MMP revealed all values WNL except for IgA at 2646, Total Protein at 8.7, B-globulin at 3.6, M Protein at 2.6g, and Globulin Total at 5.2. 06/03/18 Beta-2 microglobulin at 3.1 06/03/18 SFLC revealed Kappa at 1390.2, and K:L ratio at 105.32  On review of systems, pt reports stable energy levels, and denies  urination abnormalities, back pain, changes in bowel habits, abdominal pains, and any other symptoms.   On PMHx the pt reports Afib. On Social Hx the pt reports infrequent single alcoholic beverage. Denies ever smoking cigarettes. 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 On Family Hx the pt denies significant concerns.  Interval History:   I connected with Mylen D Husk on 07/14/18 at  9:00 AM EDT by telephone and verified that I am speaking with the correct person using two identifiers.  I discussed the limitations, risks, security and privacy concerns of performing an evaluation and management service by telemedicine and the availability of in-person appointments. I also discussed with the patient that there may be a patient responsible charge related to this service. The patient expressed understanding and agreed to proceed.  Other persons participating in the visit and their role in the encounter: his wife and daughter.  Patient's location: his home Provider's location: my office at the [facility] returns today for management and evaluation of his Multiple Myeloma. The patient's last visit with [place] was on 06/30/18. The pt reports that he is doing well overall.  The pt reports that he has not developed any new concerns in the brief interim. He notes that he prefers a mild, conservative approach to treatment and we will continue to evaluate his goals of care throughout treatment.  Lab results (06/30/18) of CBC w/diff and CMP is as follows:  all values are WNL except for WBC at 3.4k, RBC at 4.00, Creatinine at 1.25, Total Protein at 9.8, Albumin at 3.0, GFR at 51. 06/30/18 Beta 2 microglobulin at 3.8, LDH at <120 06/30/18 SFLC revealed Kappa at 1802.8, Lambda at 12.0, and K:L ratio of 150.23 06/30/18 MMP revealed all values WNL except for IgA at >6400, Total Protein at 9.5, B-globulin at 4.1, M Protein at 3.1, Globulin total at 5.9.  On review of systems, pt  reports stable energy levels, stable appetite, and denies any other symptoms.   MEDICAL HISTORY:  Past Medical History:  Diagnosis Date  . Fracture of femoral neck, right ([place]) 07/10/2015   s/p mechanical fall  . Hyperlipidemia   . Hypertensive heart disease   . Lumbar disc disease   . Osteoarthritis   . Permanent atrial fibrillation    a. 07/2012 Echo: EF 55-60%, no rwma, mildly dil RA/LA.  Marland Kitchen Syncope    a. 07/2012 - unclear etiology-->resulted in right tib/fib fx req ORIF/intramedullary nail of tibia.    SURGICAL HISTORY: Past Surgical History:  Procedure Laterality Date  . ANKLE SURGERY     right ankle  . TIBIA IM NAIL INSERTION Right 07/21/2012   Procedure: INTRAMEDULLARY (IM) NAIL TIBIAL;  Surgeon: Johnny Bridge, MD;  Location: [facility];  Service: Orthopedics;  Laterality: Right;  . TOTAL HIP ARTHROPLASTY     left  . TOTAL HIP ARTHROPLASTY Right 07/11/2015   Procedure: TOTAL HIP ARTHROPLASTY;  Surgeon: Marchia Bond, MD;  Location: [facility];  Service: Orthopedics;  Laterality: Right;    SOCIAL HISTORY: Social History   Socioeconomic History  . Marital status: Married    Spouse name: Not on file  . Number of children: 2  . Years of education: Not on file  . Highest education level: Not on file  Occupational History  . Occupation: retired     Fish farm manager: [employer]  . Financial resource strain: Not on file  . Food insecurity:    Worry: Not on file    Inability: Not on file  . Transportation needs:    Medical: Not on file    Non-medical: Not on file  Tobacco Use  . Smoking status: Never Smoker  . Smokeless tobacco: Never Used  Substance and Sexual Activity  . Alcohol use: Yes    Alcohol/week: 12.0 standard drinks    Types: 12 Cans of beer per week    Comment: 2 beers several x/wk and occas cocktail on top of it.  . Drug use: No  . Sexual activity: Never  Lifestyle  . Physical activity:    Days per week: Not on file    Minutes per session: Not on file   . Stress: Not on file  Relationships  . Social connections:    Talks on phone: Not on file    Gets together: Not on file    Attends religious service: Not on file    Active member of club or organization: Not on file    Attends meetings of clubs or organizations: Not on file    Relationship status: Not on file  . Intimate partner violence:    Fear of current or ex partner: Not on file    Emotionally abused: Not on file    Physically abused: Not on file    Forced sexual activity: Not on file  Other Topics Concern  . Not on file  Social History Narrative   Not sure about living will  Requests that wife make decisions for him if needed   Would accept resuscitation but no prolonged artificial ventilation   Would not want tube feeds if cognitively unaware    FAMILY HISTORY: Family History  Problem Relation Age of Onset  . Cancer Mother   . Hyperlipidemia Sister   . Hypertension Sister     ALLERGIES:  has No Known Allergies.  MEDICATIONS:  Current Outpatient Medications  Medication Sig Dispense Refill  . Acetaminophen (TYLENOL PO) Take 1 tablet by mouth daily as needed (pain). Pt unsure of strength    . apixaban (ELIQUIS) 2.5 MG TABS tablet Take 1 tablet (2.5 mg total) by mouth 2 (two) times daily. 180 tablet 3  . losartan (COZAAR) 50 MG tablet TAKE 1 TABLET TWICE A DAY 180 tablet 3  . sennosides-docusate sodium (SENOKOT-S) 8.6-50 MG tablet Take 2 tablets by mouth daily. (Patient taking differently: Take 2 tablets by mouth as needed. ) 30 tablet 1   No current facility-administered medications for this visit.     REVIEW OF SYSTEMS:    A 10+ POINT REVIEW OF SYSTEMS WAS OBTAINED including neurology, dermatology, psychiatry, cardiac, respiratory, lymph, extremities, GI, GU, Musculoskeletal, constitutional, breasts, reproductive, HEENT.  All pertinent positives are noted in the HPI.  All others are negative.   PHYSICAL EXAMINATION: ECOG PERFORMANCE STATUS: 3 - Symptomatic,  >50% confined to bed  . There were no vitals filed for this visit. There were no vitals filed for this visit. .There is no height or weight on file to calculate BMI.  No Physical Exam, per Phone Visit.  LABORATORY DATA:  I have reviewed the data as listed  . CBC Latest Ref Rng & Units 06/30/2018 06/03/2018 04/20/2018  WBC 4.0 - 10.5 K/uL 3.4(L) 3.8(L) 3.5(L)  Hemoglobin 13.0 - 17.0 g/dL 13.3 12.5(L) 14.4  Hematocrit 39.0 - 52.0 % 39.6 37.4(L) 42.2  Platelets 150 - 400 K/uL 160 145(L) 201.0    . CMP Latest Ref Rng & Units 06/30/2018 04/26/2018 04/20/2018  Glucose 70 - 99 mg/dL 97 - 99  BUN 8 - 23 mg/dL 16 - 11  Creatinine 0.61 - 1.24 mg/dL 1.25(H) - 1.00  Sodium 135 - 145 mmol/L 138 - 137  Potassium 3.5 - 5.1 mmol/L 4.4 - 4.5  Chloride 98 - 111 mmol/L 101 - 100  CO2 22 - 32 mmol/L 24 - 26  Calcium 8.9 - 10.3 mg/dL 10.0 - 10.0  Total Protein 6.5 - 8.1 g/dL 9.8(H) 7.9 9.2(H)  Total Bilirubin 0.3 - 1.2 mg/dL 0.7 - 0.7  Alkaline Phos 38 - 126 U/L 49 - 49  AST 15 - 41 U/L 21 - 23  ALT 0 - 44 U/L 20 - 20    06/03/18 BM Bx:    06/03/18 Cytogenetics:      RADIOGRAPHIC STUDIES: I have personally reviewed the radiological images as listed and agreed with the findings in the report. No results found.  ASSESSMENT & PLAN:  85 y.o. male with  1. Multiple Myeloma 06/03/18 BM Biopsy revealed cellular marrow involved by plasma cell neoplasm (about 50%) 06/03/18 Cytogenetics revealed 54.5% of cells showed gain of ATM, and 51.5% of cells with loss of p53 06/03/18 Bone Survey revealed Possible 8 mm lytic lesion versus subarachnoid granulation within the parietal convexity of the calvarium. No other suspicious lytic lesions seen within the skeleton. Labs upon initial presentation from 06/03/18, M Protein at 2.6g with IgA kappa specificity. Some new mild anemia with HGB at 12.5.  PLAN: -Discussed pt labwork from  06/30/18; M spike at 3.1g, IgA at >642m, Beta 2 microglobulin at 3.8, Kappa light  chains at 1802. CBC stable. Creatinine at 1.25. Total Protein at 9.8g, and albumin decreased to 3.0. -Did order PET/CT which was not scheduled -Discussed the CRAB criteria with the pt and his family. Some recent mild anemia with HGB at 12.5, Creatinine at 1.25, no hypercalcemia, possible bone tumor in skull, though need PET/CT for further characterization and to rule out plasmacytomas. -Discussed that in view of the above, and K:L ratio in excess of 100, and BM with greater than 50% involvement, the pt does meet criteria to consider initiating treatment if this is in keeping with his goals of care -Discussed again that the pt has a 17p mutation, which can be a predictor of treatment resistance and a possibly more aggressive course. -Discussed again that multiple myeloma is a treatable condition, but is not curable, and therefore the intent of treatment is to suppress the abnormal plasma cells.  -Pt prefers a conservative treatment course, but does prefer treatment in general as opposed to purely pursuing comfort measures. -Discussed again the possible treatment options: Dexamethasone and Velcade vs Ninlaro, and then possibly adding on Revlimid based on response and tolerance. Discussed possible side effects including neuropathy, shingles outbreaks, and blood clots. -The pt would like to begin treatment, and we will set him up to begin in the next 1-2 weeks -Will see the pt back in 7-10 days   PET/CT scheduling still pending Chemo-education for Velcade + Dexamethasone  Schedule to start Velcade + Dexamethasone in 7-10 days (weekly) RTC with Dr KIrene Limbowith 1st dose of treatment.   I discussed the assessment and treatment plan with the patient. The patient was provided an opportunity to ask questions and al were answered. The patient agreed with the plan and demonstrated an understanding of the instructions.   The patient was advised to call back or seek an in-person evaluation if the symptoms worsen  or if the condition fails to improve as anticipated.  The total time spent in the appt was 45 minutes and more than 50% was on counseling and direct patient cares.    GSullivan LoneMD MS AAHIVMS SMemorialcare Miller Childrens And Womens HospitalCHillsboro Community HospitalHematology/Oncology Physician CNorth[facility]st [facility] (Office):       [phone](Work cell):  [phone](Fax):           [phone] 07/14/2018 10:48 AM  I, SBaldwin Jamaica am acting as a scribe for Dr. GSullivan Lone   .I have reviewed the above documentation for accuracy and completeness, and I agree with the above. .Brunetta GeneraMD

## 2018-07-14 ENCOUNTER — Inpatient Hospital Stay (HOSPITAL_BASED_OUTPATIENT_CLINIC_OR_DEPARTMENT_OTHER): Payer: Medicare Other | Admitting: Hematology

## 2018-07-14 ENCOUNTER — Other Ambulatory Visit: Payer: Self-pay

## 2018-07-14 DIAGNOSIS — C9000 Multiple myeloma not having achieved remission: Secondary | ICD-10-CM

## 2018-07-14 DIAGNOSIS — Z7189 Other specified counseling: Secondary | ICD-10-CM | POA: Diagnosis not present

## 2018-07-15 ENCOUNTER — Other Ambulatory Visit: Payer: Self-pay | Admitting: Internal Medicine

## 2018-07-15 ENCOUNTER — Telehealth: Payer: Self-pay | Admitting: Hematology

## 2018-07-15 NOTE — Telephone Encounter (Signed)
Called and scheduled appt per 4/22 los.  Patient daughter aware of appt date and time.

## 2018-07-16 ENCOUNTER — Other Ambulatory Visit: Payer: Self-pay | Admitting: Hematology

## 2018-07-19 DIAGNOSIS — Z7189 Other specified counseling: Secondary | ICD-10-CM | POA: Insufficient documentation

## 2018-07-19 DIAGNOSIS — C9000 Multiple myeloma not having achieved remission: Secondary | ICD-10-CM | POA: Insufficient documentation

## 2018-07-19 MED ORDER — ONDANSETRON HCL 8 MG PO TABS: 8.0000 mg | ORAL_TABLET | Freq: Two times a day (BID) | ORAL | 1 refills | Status: AC | PRN

## 2018-07-19 MED ORDER — ACYCLOVIR 400 MG PO TABS: 400.0000 mg | ORAL_TABLET | Freq: Two times a day (BID) | ORAL | 3 refills | Status: DC

## 2018-07-19 NOTE — Progress Notes (Signed)
START ON PATHWAY REGIMEN - Multiple Myeloma and Other Plasma Cell Dyscrasias     A cycle is every 21 days:     Bortezomib      Lenalidomide      Dexamethasone   **Always confirm dose/schedule in your pharmacy ordering system**  Patient Characteristics: Newly Diagnosed, Transplant Ineligible or Refused, High Risk R-ISS Staging: III Disease Classification: Newly Diagnosed Is Patient Eligible for Transplant<= Transplant Ineligible or Refused Risk Status: High Risk Intent of Therapy: Non-Curative / Palliative Intent, Discussed with Patient

## 2018-07-20 ENCOUNTER — Ambulatory Visit (HOSPITAL_COMMUNITY)
Admission: RE | Admit: 2018-07-20 | Discharge: 2018-07-20 | Disposition: A | Discharge status: Home / Self Care | Payer: Medicare Other | Source: Ambulatory Visit | Attending: Hematology | Admitting: Hematology

## 2018-07-20 ENCOUNTER — Other Ambulatory Visit: Payer: Self-pay

## 2018-07-20 DIAGNOSIS — C9000 Multiple myeloma not having achieved remission: Secondary | ICD-10-CM | POA: Insufficient documentation

## 2018-07-20 DIAGNOSIS — Z79899 Other long term (current) drug therapy: Secondary | ICD-10-CM | POA: Insufficient documentation

## 2018-07-20 LAB — GLUCOSE, CAPILLARY: Glucose-Capillary: 99 mg/dL (ref 70–99)

## 2018-07-20 MED ORDER — FLUDEOXYGLUCOSE F - 18 (FDG) INJECTION
9.7900 | Freq: Once | INTRAVENOUS | Status: AC | PRN
  Administered 2018-07-20: 14:00:00 9.79 via INTRAVENOUS

## 2018-07-21 ENCOUNTER — Telehealth: Payer: Self-pay | Admitting: *Deleted

## 2018-07-21 ENCOUNTER — Inpatient Hospital Stay: Payer: Medicare Other

## 2018-07-23 NOTE — Progress Notes (Signed)
HEMATOLOGY/ONCOLOGY CLINIC NOTE  Date of Service: 07/26/2018  Patient Care Team: Venia Carbon, MD as PCP - Bethena Roys, MD as Consulting Physician (Cardiology) Marchia Bond, MD as Consulting Physician (Orthopedic Surgery)  CHIEF COMPLAINTS/PURPOSE OF CONSULTATION:  Multiple Myeloma  HISTORY OF PRESENTING ILLNESS:   Martin Conner is a wonderful 85 y.o. male who has been referred to [place] by Dr. Nicholas Lose for evaluation and management of his newly diagnosed Multiple Myeloma. The pt reports that he is doing well overall.   The pt reports that he has not felt differently recently as compared to his baseline 6-12 months ago. The pt notes that he had a physical in late January 2020 with his PCP and routine labs which revealed a concern for high protein. This was evaluated further and the pt was referred to my colleague Dr. Lindi Adie who has completed initial work up.  The pt denies problems passing urine, bowel abnormalities, or neuropathy. He also denies back pains.  The pt notes that he walks very little, ambulates with a cane or a walker, but can get around in his home well. He lives with his wife in his own home. The pt denies having falls, and uses a cane/walker to prevent falls. The pt notes that he has two children, and notes that they are not very involved with his medical decision making.  The pt denies heart, liver, lung, thyroid problems. He does have Afib however, and takes Eliquis.  Most recent lab results (06/03/18) of CBC w/diff is as follows: all values are WNL except for WBC at 3.8k, RBC at 3.73, HGB at 12.5, HCT at 37.4, MCV at 100.3, PLT at 145k. 06/03/18 MMP revealed all values WNL except for IgA at 2646, Total Protein at 8.7, B-globulin at 3.6, M Protein at 2.6g, and Globulin Total at 5.2. 06/03/18 Beta-2 microglobulin at 3.1 06/03/18 SFLC revealed Kappa at 1390.2, and K:L ratio at 105.32  On review of systems, pt reports stable energy levels, and denies  urination abnormalities, back pain, changes in bowel habits, abdominal pains, and any other symptoms.   On PMHx the pt reports Afib. On Social Hx the pt reports infrequent single alcoholic beverage. Denies ever smoking cigarettes. 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 On Family Hx the pt denies significant concerns.  Interval History:    Martin Conner returns today for management and evaluation of his Multiple Myeloma. The patient's last visit with [place] was on 07/14/18. The pt reports that he is doing well overall.  The pt reports that he has not developed any new concerns in the interim. He endorses eating well, notes he likely doesn't drink enough water, and endorses stable energy levels. The pt denies any bone pains or concerns for infections.  Of note since the patient's last visit, pt has had a PET/CT completed on 07/20/18 with results revealing No hypermetabolic bone disease evident. 2. 12 mm hypermetabolic right thyroid nodule, nonspecific. Thyroid ultrasound could be used to further evaluate as clinically warranted in this individual. No other unexpected hypermetabolic soft tissue disease identified on today's study. 3. Bilateral tiny pulmonary nodules measuring up to about 3 mm maximum size. Consider follow-up to ensure stability. 4. Aortic Atherosclerois.  Lab results today (07/26/18) of CBC w/diff is as follows: all values are WNL except for WBC at 2.7k, RBC at 3.54, HGB at 11.9, HCT at 35.0, PLT at 135k, ANC at 1.4k. 07/26/18 MMP and SFLC are pending.  On review  of systems, pt reports stable energy levels, eating well, and denies bone pains, concerns for infections, and any other symptoms.   MEDICAL HISTORY:  Past Medical History:  Diagnosis Date  . Fracture of femoral neck, right ([place]) 07/10/2015   s/p mechanical fall  . Hyperlipidemia   . Hypertensive heart disease   . Lumbar disc disease   . Osteoarthritis   . Permanent atrial fibrillation    a. 07/2012 Echo: EF  55-60%, no rwma, mildly dil RA/LA.  Marland Kitchen Syncope    a. 07/2012 - unclear etiology-->resulted in right tib/fib fx req ORIF/intramedullary nail of tibia.    SURGICAL HISTORY: Past Surgical History:  Procedure Laterality Date  . ANKLE SURGERY     right ankle  . TIBIA IM NAIL INSERTION Right 07/21/2012   Procedure: INTRAMEDULLARY (IM) NAIL TIBIAL;  Surgeon: Johnny Bridge, MD;  Location: [facility];  Service: Orthopedics;  Laterality: Right;  . TOTAL HIP ARTHROPLASTY     left  . TOTAL HIP ARTHROPLASTY Right 07/11/2015   Procedure: TOTAL HIP ARTHROPLASTY;  Surgeon: Marchia Bond, MD;  Location: [facility];  Service: Orthopedics;  Laterality: Right;    SOCIAL HISTORY: Social History   Socioeconomic History  . Marital status: Married    Spouse name: Not on file  . Number of children: 2  . Years of education: Not on file  . Highest education level: Not on file  Occupational History  . Occupation: retired     Fish farm manager: [employer]  . Financial resource strain: Not on file  . Food insecurity:    Worry: Not on file    Inability: Not on file  . Transportation needs:    Medical: Not on file    Non-medical: Not on file  Tobacco Use  . Smoking status: Never Smoker  . Smokeless tobacco: Never Used  Substance and Sexual Activity  . Alcohol use: Yes    Alcohol/week: 12.0 standard drinks    Types: 12 Cans of beer per week    Comment: 2 beers several x/wk and occas cocktail on top of it.  . Drug use: No  . Sexual activity: Never  Lifestyle  . Physical activity:    Days per week: Not on file    Minutes per session: Not on file  . Stress: Not on file  Relationships  . Social connections:    Talks on phone: Not on file    Gets together: Not on file    Attends religious service: Not on file    Active member of club or organization: Not on file    Attends meetings of clubs or organizations: Not on file    Relationship status: Not on file  . Intimate partner violence:    Fear of  current or ex partner: Not on file    Emotionally abused: Not on file    Physically abused: Not on file    Forced sexual activity: Not on file  Other Topics Concern  . Not on file  Social History Narrative   Not sure about living will   Requests that wife make decisions for him if needed   Would accept resuscitation but no prolonged artificial ventilation   Would not want tube feeds if cognitively unaware    FAMILY HISTORY: Family History  Problem Relation Age of Onset  . Cancer Mother   . Hyperlipidemia Sister   . Hypertension Sister     ALLERGIES:  has No Known Allergies.  MEDICATIONS:  Current Outpatient  Medications  Medication Sig Dispense Refill  . Acetaminophen (TYLENOL PO) Take 1 tablet by mouth daily as needed (pain). Pt unsure of strength    . acyclovir (ZOVIRAX) 400 MG tablet Take 1 tablet (400 mg total) by mouth 2 (two) times daily. 60 tablet 3  . apixaban (ELIQUIS) 2.5 MG TABS tablet Take 1 tablet (2.5 mg total) by mouth 2 (two) times daily. 180 tablet 3  . losartan (COZAAR) 50 MG tablet TAKE 1 TABLET TWICE A DAY 180 tablet 3  . ondansetron (ZOFRAN) 8 MG tablet Take 1 tablet (8 mg total) by mouth 2 (two) times daily as needed (Nausea or vomiting). 30 tablet 1  . sennosides-docusate sodium (SENOKOT-S) 8.6-50 MG tablet Take 2 tablets by mouth daily. (Patient taking differently: Take 2 tablets by mouth as needed. ) 30 tablet 1   No current facility-administered medications for this visit.     REVIEW OF SYSTEMS:    A 10+ POINT REVIEW OF SYSTEMS WAS OBTAINED including neurology, dermatology, psychiatry, cardiac, respiratory, lymph, extremities, GI, GU, Musculoskeletal, constitutional, breasts, reproductive, HEENT.  All pertinent positives are noted in the HPI.  All others are negative.   PHYSICAL EXAMINATION: ECOG PERFORMANCE STATUS: 3 - Symptomatic, >50% confined to bed  Vitals:   07/26/18 0840  BP: (!) 152/90  Pulse: 69  Resp: 18  Temp: 98 F (36.7 C)  SpO2:  97%   Filed Weights   07/26/18 0840  Weight: 176 lb 4.8 oz (80 kg)   .Body mass index is 25.3 kg/m.  GENERAL:alert, in no acute distress and comfortable SKIN: no acute rashes, no significant lesions EYES: conjunctiva are pink and non-injected, sclera anicteric OROPHARYNX: MMM, no exudates, no oropharyngeal erythema or ulceration NECK: supple, no JVD LYMPH:  no palpable lymphadenopathy in the cervical, axillary or inguinal regions LUNGS: clear to auscultation b/l with normal respiratory effort HEART: regular rate & rhythm ABDOMEN:  normoactive bowel sounds , non tender, not distended. No palpable hepatosplenomegaly.  Extremity: no pedal edema PSYCH: alert & oriented x 3 with fluent speech NEURO: no focal motor/sensory deficits  LABORATORY DATA:  I have reviewed the data as listed  . CBC Latest Ref Rng & Units 07/26/2018 06/30/2018 06/03/2018  WBC 4.0 - 10.5 K/uL 2.7(L) 3.4(L) 3.8(L)  Hemoglobin 13.0 - 17.0 g/dL 11.9(L) 13.3 12.5(L)  Hematocrit 39.0 - 52.0 % 35.0(L) 39.6 37.4(L)  Platelets 150 - 400 K/uL 135(L) 160 145(L)    . CMP Latest Ref Rng & Units 06/30/2018 04/26/2018 04/20/2018  Glucose 70 - 99 mg/dL 97 - 99  BUN 8 - 23 mg/dL 16 - 11  Creatinine 0.61 - 1.24 mg/dL 1.25(H) - 1.00  Sodium 135 - 145 mmol/L 138 - 137  Potassium 3.5 - 5.1 mmol/L 4.4 - 4.5  Chloride 98 - 111 mmol/L 101 - 100  CO2 22 - 32 mmol/L 24 - 26  Calcium 8.9 - 10.3 mg/dL 10.0 - 10.0  Total Protein 6.5 - 8.1 g/dL 9.8(H) 7.9 9.2(H)  Total Bilirubin 0.3 - 1.2 mg/dL 0.7 - 0.7  Alkaline Phos 38 - 126 U/L 49 - 49  AST 15 - 41 U/L 21 - 23  ALT 0 - 44 U/L 20 - 20    06/03/18 BM Bx:    06/03/18 Cytogenetics:      RADIOGRAPHIC STUDIES: I have personally reviewed the radiological images as listed and agreed with the findings in the report. Nm Pet Image Initial (pi) Whole Body  Result Date: 07/20/2018 CLINICAL DATA:  Initial treatment strategy for  multiple myeloma. EXAM: NUCLEAR MEDICINE PET WHOLE BODY  TECHNIQUE: 9.8 mCi F-18 FDG was injected intravenously. Full-ring PET imaging was performed from the skull base to thigh after the radiotracer. CT data was obtained and used for attenuation correction and anatomic localization. Fasting blood glucose: 9 9 mg/dl COMPARISON:  None. FINDINGS: Mediastinal blood pool activity: SUV max 2.4 HEAD/NECK: No hypermetabolic activity in the scalp. No hypermetabolic cervical lymph nodes. 12 mm right thyroid nodule shows low level hypermetabolism with SUV max = 4.8. Incidental CT findings: none CHEST: No hypermetabolic mediastinal or hilar nodes. No suspicious pulmonary nodules on the CT scan. Incidental CT findings: Coronary artery calcification is evident. Atherosclerotic calcification is noted in the wall of the thoracic aorta. No mediastinal lymphadenopathy. 3 mm right middle lobe nodule seen 34/8. 3 mm right lower lobe nodule visible on 38/8. 3 mm left upper lobe nodule visible on 26/8. Other scattered 2 mm left upper lobe pulmonary nodules are evident. 2 mm left lower lobe nodule visible on 39/8. ABDOMEN/PELVIS: No abnormal hypermetabolic activity within the liver, pancreas, adrenal glands, or spleen. No hypermetabolic lymph nodes in the abdomen or pelvis. Incidental CT findings: 2 mm nonobstructing stone identified upper pole right kidney with 13 mm nonobstructing stone noted in the lower pole the right kidney. Tiny stones are seen in the left kidney without hydronephrosis. Exophytic cyst lower pole left kidney show no hypermetabolism. There is abdominal aortic atherosclerosis without aneurysm. Diverticular changes noted left colon. SKELETON: No focal hypermetabolic activity to suggest skeletal metastasis. Incidental CT findings: Bilateral hip replacement. Intramedullary nail noted right tibia. EXTREMITIES: No abnormal hypermetabolic activity in the lower extremities. Incidental CT findings: none IMPRESSION: 1. No hypermetabolic bone disease evident. 2. 12 mm hypermetabolic  right thyroid nodule, nonspecific. Thyroid ultrasound could be used to further evaluate as clinically warranted in this individual. No other unexpected hypermetabolic soft tissue disease identified on today's study. 3. Bilateral tiny pulmonary nodules measuring up to about 3 mm maximum size. Consider follow-up to ensure stability. 4.  Aortic Atherosclerois (ICD10-170.0) Electronically Signed   By: Misty Stanley M.D.   On: 07/20/2018 16:30    ASSESSMENT & PLAN:  85 y.o. male with  1. Multiple Myeloma 06/03/18 BM Biopsy revealed cellular marrow involved by plasma cell neoplasm (about 50%) 06/03/18 Cytogenetics revealed 54.5% of cells showed gain of ATM, and 51.5% of cells with loss of p53 06/03/18 Bone Survey revealed Possible 8 mm lytic lesion versus subarachnoid granulation within the parietal convexity of the calvarium. No other suspicious lytic lesions seen within the skeleton. Labs upon initial presentation from 06/03/18, M Protein at 2.6g with IgA kappa specificity. Some new mild anemia with HGB at 12.5.   PLAN: -Discussed pt labwork today, 07/26/18; HGB slightly lower at 11.9, other blood counts are stable -07/26/18 MMP and SFLC are pending. Last available M spike from 06/30/18 was at 3.1g -Discussed the 07/20/18 PET/CT which revealed No hypermetabolic bone disease evident. 2. 12 mm hypermetabolic right thyroid nodule, nonspecific. Thyroid ultrasound could be used to further evaluate as clinically warranted in this individual. No other unexpected hypermetabolic soft tissue disease identified on today's study. 3. Bilateral tiny pulmonary nodules measuring up to about 3 mm maximum size. Consider follow-up to ensure stability. 4.  Aortic Atherosclerosis. -The pt has no prohibitive toxicities from beginning C1 Velcade and Dexamethasone at this time.  -Will look to add Revlimid during C2 after displayed tolerance during C1 -Will hold on bone directed therapies at this time given absence of bone tumors  -  Discussed again that the pt has a 17p mutation, which can be a predictor of treatment resistance and a possibly more aggressive course. -Continue Acyclovir BID -Will check labs weekly -Will see the pt back in 2 weeks with toxicity check   -continue weekly Velcade as per orders. Please schedule next 2 months of treatments -labs weekly -RTC with Dr Irene Limbo in 2 weeks for toxicity check   I discussed the assessment and treatment plan with the patient. The patient was provided an opportunity to ask questions and all were answered. The patient agreed with the plan and demonstrated an understanding of the instructions.    The total time spent in the appt was 25 minutes and more than 50% was on counseling and direct patient cares.    Sullivan Lone MD MS AAHIVMS [facility] Hematology/Oncology Physician [facility]  (Office):       [phone] (Work cell):  [phone] (Fax):           [phone]  07/26/2018 9:24 AM  I, Baldwin [place], am acting as a scribe for Dr. Sullivan Lone.   .I have reviewed the above documentation for accuracy and completeness, and I agree with the above. Brunetta Genera MD

## 2018-07-26 ENCOUNTER — Inpatient Hospital Stay: Payer: Medicare Other | Attending: Hematology and Oncology | Admitting: Hematology

## 2018-07-26 ENCOUNTER — Telehealth: Payer: Self-pay | Admitting: Hematology

## 2018-07-26 ENCOUNTER — Inpatient Hospital Stay: Payer: Medicare Other

## 2018-07-26 ENCOUNTER — Other Ambulatory Visit: Payer: Self-pay

## 2018-07-26 VITALS — BP 152/90 | HR 69 | Temp 98.0°F | Resp 18 | Ht 70.0 in | Wt 176.3 lb

## 2018-07-26 DIAGNOSIS — Z5111 Encounter for antineoplastic chemotherapy: Secondary | ICD-10-CM | POA: Diagnosis not present

## 2018-07-26 DIAGNOSIS — E041 Nontoxic single thyroid nodule: Secondary | ICD-10-CM

## 2018-07-26 DIAGNOSIS — Z96643 Presence of artificial hip joint, bilateral: Secondary | ICD-10-CM | POA: Insufficient documentation

## 2018-07-26 DIAGNOSIS — Z7189 Other specified counseling: Secondary | ICD-10-CM

## 2018-07-26 DIAGNOSIS — E785 Hyperlipidemia, unspecified: Secondary | ICD-10-CM | POA: Diagnosis not present

## 2018-07-26 DIAGNOSIS — I4821 Permanent atrial fibrillation: Secondary | ICD-10-CM | POA: Insufficient documentation

## 2018-07-26 DIAGNOSIS — I119 Hypertensive heart disease without heart failure: Secondary | ICD-10-CM | POA: Insufficient documentation

## 2018-07-26 DIAGNOSIS — R918 Other nonspecific abnormal finding of lung field: Secondary | ICD-10-CM

## 2018-07-26 DIAGNOSIS — D649 Anemia, unspecified: Secondary | ICD-10-CM | POA: Diagnosis not present

## 2018-07-26 DIAGNOSIS — C9000 Multiple myeloma not having achieved remission: Secondary | ICD-10-CM | POA: Insufficient documentation

## 2018-07-26 DIAGNOSIS — I700 Atherosclerosis of aorta: Secondary | ICD-10-CM | POA: Diagnosis not present

## 2018-07-26 DIAGNOSIS — Z79899 Other long term (current) drug therapy: Secondary | ICD-10-CM | POA: Diagnosis not present

## 2018-07-26 DIAGNOSIS — M1990 Unspecified osteoarthritis, unspecified site: Secondary | ICD-10-CM | POA: Insufficient documentation

## 2018-07-26 DIAGNOSIS — Z7901 Long term (current) use of anticoagulants: Secondary | ICD-10-CM | POA: Insufficient documentation

## 2018-07-26 DIAGNOSIS — D472 Monoclonal gammopathy: Secondary | ICD-10-CM

## 2018-07-26 LAB — CMP (CANCER CENTER ONLY)
ALT: 13 U/L (ref 0–44)
AST: 17 U/L (ref 15–41)
Albumin: 3 g/dL — ABNORMAL LOW (ref 3.5–5.0)
Alkaline Phosphatase: 44 U/L (ref 38–126)
Anion gap: 12 (ref 5–15)
BUN: 15 mg/dL (ref 8–23)
CO2: 20 mmol/L — ABNORMAL LOW (ref 22–32)
Calcium: 8.1 mg/dL — ABNORMAL LOW (ref 8.9–10.3)
Chloride: 107 mmol/L (ref 98–111)
Creatinine: 1.13 mg/dL (ref 0.61–1.24)
GFR, Est AFR Am: >60 mL/min (ref >60)
GFR, Estimated: 58 mL/min — ABNORMAL LOW (ref >60)
Glucose, Bld: 95 mg/dL (ref 70–99)
Potassium: 4.1 mmol/L (ref 3.5–5.1)
Sodium: 139 mmol/L (ref 135–145)
Total Bilirubin: 0.5 mg/dL (ref 0.3–1.2)
Total Protein: 8.5 g/dL — ABNORMAL HIGH (ref 6.5–8.1)

## 2018-07-26 LAB — CBC WITH DIFFERENTIAL (CANCER CENTER ONLY)
Abs Immature Granulocytes: 0 10*3/uL (ref 0.00–0.07)
Basophils Absolute: 0 10*3/uL (ref 0.0–0.1)
Basophils Relative: 1 %
Eosinophils Absolute: 0 10*3/uL (ref 0.0–0.5)
Eosinophils Relative: 1 %
HCT: 35 % — ABNORMAL LOW (ref 39.0–52.0)
Hemoglobin: 11.9 g/dL — ABNORMAL LOW (ref 13.0–17.0)
Immature Granulocytes: 0 %
Lymphocytes Relative: 38 %
Lymphs Abs: 1 10*3/uL (ref 0.7–4.0)
MCH: 33.6 pg (ref 26.0–34.0)
MCHC: 34 g/dL (ref 30.0–36.0)
MCV: 98.9 fL (ref 80.0–100.0)
Monocytes Absolute: 0.2 10*3/uL (ref 0.1–1.0)
Monocytes Relative: 6 %
Neutro Abs: 1.4 10*3/uL — ABNORMAL LOW (ref 1.7–7.7)
Neutrophils Relative %: 54 %
Platelet Count: 135 10*3/uL — ABNORMAL LOW (ref 150–400)
RBC: 3.54 MIL/uL — ABNORMAL LOW (ref 4.22–5.81)
RDW: 15.2 % (ref 11.5–15.5)
WBC Count: 2.7 10*3/uL — ABNORMAL LOW (ref 4.0–10.5)
nRBC: 0 % (ref 0.0–0.2)

## 2018-07-26 MED ORDER — DEXAMETHASONE 4 MG PO TABS
20.0000 mg | ORAL_TABLET | Freq: Once | ORAL | Status: AC
  Administered 2018-07-26: 10:00:00 20 mg via ORAL

## 2018-07-26 MED ORDER — DEXAMETHASONE 4 MG PO TABS
ORAL_TABLET | ORAL | Status: AC
  Filled 2018-07-26: qty 5

## 2018-07-26 MED ORDER — BORTEZOMIB CHEMO SQ INJECTION 3.5 MG (2.5MG/ML)
1.3000 mg/m2 | Freq: Once | INTRAMUSCULAR | Status: AC
  Administered 2018-07-26: 11:00:00 2.5 mg via SUBCUTANEOUS
  Filled 2018-07-26: qty 1

## 2018-07-26 MED ORDER — PROCHLORPERAZINE MALEATE 10 MG PO TABS
10.0000 mg | ORAL_TABLET | Freq: Once | ORAL | Status: AC
  Administered 2018-07-26: 10:00:00 10 mg via ORAL

## 2018-07-26 MED ORDER — PROCHLORPERAZINE MALEATE 10 MG PO TABS
ORAL_TABLET | ORAL | Status: AC
  Filled 2018-07-26: qty 1

## 2018-07-26 NOTE — Telephone Encounter (Signed)
Scheduled appt per 5/4 los.  A calendar will be mailed out.

## 2018-07-26 NOTE — Patient Instructions (Signed)

## 2018-07-27 LAB — MULTIPLE MYELOMA PANEL, SERUM
Albumin SerPl Elph-Mcnc: 3.4 g/dL (ref 2.9–4.4)
Albumin/Glob SerPl: 0.8 (ref 0.7–1.7)
Alpha 1: 0.2 g/dL (ref 0.0–0.4)
Alpha2 Glob SerPl Elph-Mcnc: 0.8 g/dL (ref 0.4–1.0)
B-Globulin SerPl Elph-Mcnc: 0.7 g/dL (ref 0.7–1.3)
Gamma Glob SerPl Elph-Mcnc: 3 g/dL — ABNORMAL HIGH (ref 0.4–1.8)
Globulin, Total: 4.7 g/dL — ABNORMAL HIGH (ref 2.2–3.9)
IgA: 2635 mg/dL — ABNORMAL HIGH (ref 61–437)
IgG (Immunoglobin G), Serum: 665 mg/dL (ref 603–1613)
IgM (Immunoglobulin M), Srm: 33 mg/dL (ref 15–143)
M Protein SerPl Elph-Mcnc: 2.2 g/dL — ABNORMAL HIGH
Total Protein ELP: 8.1 g/dL (ref 6.0–8.5)

## 2018-07-27 LAB — KAPPA/LAMBDA LIGHT CHAINS
Kappa free light chain: 1523.7 mg/L — ABNORMAL HIGH (ref 3.3–19.4)
Kappa, lambda light chain ratio: 120.93 — ABNORMAL HIGH (ref 0.26–1.65)
Lambda free light chains: 12.6 mg/L (ref 5.7–26.3)

## 2018-08-02 ENCOUNTER — Other Ambulatory Visit: Payer: Self-pay

## 2018-08-02 ENCOUNTER — Inpatient Hospital Stay: Payer: Medicare Other

## 2018-08-02 VITALS — BP 154/83 | HR 92 | Temp 98.3°F | Resp 18 | Wt 176.2 lb

## 2018-08-02 DIAGNOSIS — C9000 Multiple myeloma not having achieved remission: Secondary | ICD-10-CM

## 2018-08-02 DIAGNOSIS — Z5111 Encounter for antineoplastic chemotherapy: Secondary | ICD-10-CM | POA: Diagnosis not present

## 2018-08-02 DIAGNOSIS — Z7189 Other specified counseling: Secondary | ICD-10-CM

## 2018-08-02 LAB — CBC WITH DIFFERENTIAL/PLATELET
Abs Immature Granulocytes: 0 10*3/uL (ref 0.00–0.07)
Basophils Absolute: 0 10*3/uL (ref 0.0–0.1)
Basophils Relative: 1 %
Eosinophils Absolute: 0.1 10*3/uL (ref 0.0–0.5)
Eosinophils Relative: 2 %
HCT: 36.6 % — ABNORMAL LOW (ref 39.0–52.0)
Hemoglobin: 12.5 g/dL — ABNORMAL LOW (ref 13.0–17.0)
Immature Granulocytes: 0 %
Lymphocytes Relative: 44 %
Lymphs Abs: 1.1 10*3/uL (ref 0.7–4.0)
MCH: 34.4 pg — ABNORMAL HIGH (ref 26.0–34.0)
MCHC: 34.2 g/dL (ref 30.0–36.0)
MCV: 100.8 fL — ABNORMAL HIGH (ref 80.0–100.0)
Monocytes Absolute: 0.2 10*3/uL (ref 0.1–1.0)
Monocytes Relative: 6 %
Neutro Abs: 1.2 10*3/uL — ABNORMAL LOW (ref 1.7–7.7)
Neutrophils Relative %: 47 %
Platelets: 141 10*3/uL — ABNORMAL LOW (ref 150–400)
RBC: 3.63 MIL/uL — ABNORMAL LOW (ref 4.22–5.81)
RDW: 15.9 % — ABNORMAL HIGH (ref 11.5–15.5)
WBC: 2.6 10*3/uL — ABNORMAL LOW (ref 4.0–10.5)
nRBC: 0 % (ref 0.0–0.2)

## 2018-08-02 LAB — CMP (CANCER CENTER ONLY)
ALT: 20 U/L (ref 0–44)
AST: 15 U/L (ref 15–41)
Albumin: 2.8 g/dL — ABNORMAL LOW (ref 3.5–5.0)
Alkaline Phosphatase: 49 U/L (ref 38–126)
Anion gap: 12 (ref 5–15)
BUN: 14 mg/dL (ref 8–23)
CO2: 21 mmol/L — ABNORMAL LOW (ref 22–32)
Calcium: 8.9 mg/dL (ref 8.9–10.3)
Chloride: 106 mmol/L (ref 98–111)
Creatinine: 1.11 mg/dL (ref 0.61–1.24)
GFR, Est AFR Am: >60 mL/min (ref >60)
GFR, Estimated: 59 mL/min — ABNORMAL LOW (ref >60)
Glucose, Bld: 115 mg/dL — ABNORMAL HIGH (ref 70–99)
Potassium: 3.7 mmol/L (ref 3.5–5.1)
Sodium: 139 mmol/L (ref 135–145)
Total Bilirubin: 0.3 mg/dL (ref 0.3–1.2)
Total Protein: 8.8 g/dL — ABNORMAL HIGH (ref 6.5–8.1)

## 2018-08-02 MED ORDER — DEXAMETHASONE 4 MG PO TABS
20.0000 mg | ORAL_TABLET | Freq: Once | ORAL | Status: AC
  Administered 2018-08-02: 09:00:00 20 mg via ORAL

## 2018-08-02 MED ORDER — PROCHLORPERAZINE MALEATE 10 MG PO TABS
ORAL_TABLET | ORAL | Status: AC
  Filled 2018-08-02: qty 1

## 2018-08-02 MED ORDER — PROCHLORPERAZINE MALEATE 10 MG PO TABS
10.0000 mg | ORAL_TABLET | Freq: Once | ORAL | Status: AC
  Administered 2018-08-02: 10 mg via ORAL

## 2018-08-02 MED ORDER — DEXAMETHASONE 4 MG PO TABS
ORAL_TABLET | ORAL | Status: AC
  Filled 2018-08-02: qty 1

## 2018-08-02 MED ORDER — DEXAMETHASONE 4 MG PO TABS
ORAL_TABLET | ORAL | Status: AC
  Filled 2018-08-02: qty 5

## 2018-08-02 MED ORDER — BORTEZOMIB CHEMO SQ INJECTION 3.5 MG (2.5MG/ML)
1.3000 mg/m2 | Freq: Once | INTRAMUSCULAR | Status: AC
  Administered 2018-08-02: 2.5 mg via SUBCUTANEOUS
  Filled 2018-08-02: qty 1

## 2018-08-02 NOTE — Progress Notes (Signed)
Per Dr. Irene Limbo, ok to treat with [place] 1.2.

## 2018-08-02 NOTE — Patient Instructions (Signed)

## 2018-08-03 LAB — MULTIPLE MYELOMA PANEL, SERUM
Albumin SerPl Elph-Mcnc: 3 g/dL (ref 2.9–4.4)
Albumin/Glob SerPl: 0.7 (ref 0.7–1.7)
Alpha 1: 0.3 g/dL (ref 0.0–0.4)
Alpha2 Glob SerPl Elph-Mcnc: 0.8 g/dL (ref 0.4–1.0)
B-Globulin SerPl Elph-Mcnc: 0.8 g/dL (ref 0.7–1.3)
Gamma Glob SerPl Elph-Mcnc: 3 g/dL — ABNORMAL HIGH (ref 0.4–1.8)
Globulin, Total: 4.9 g/dL — ABNORMAL HIGH (ref 2.2–3.9)
IgA: 2733 mg/dL — ABNORMAL HIGH (ref 61–437)
IgG (Immunoglobin G), Serum: 636 mg/dL (ref 603–1613)
IgM (Immunoglobulin M), Srm: 31 mg/dL (ref 15–143)
M Protein SerPl Elph-Mcnc: 2.2 g/dL — ABNORMAL HIGH
Total Protein ELP: 7.9 g/dL (ref 6.0–8.5)

## 2018-08-05 NOTE — Progress Notes (Signed)
HEMATOLOGY/ONCOLOGY CLINIC NOTE  Date of Service: 08/09/2018  Patient Care Team: Venia Carbon, MD as PCP - Bethena Roys, MD as Consulting Physician (Cardiology) Marchia Bond, MD as Consulting Physician (Orthopedic Surgery)  CHIEF COMPLAINTS/PURPOSE OF CONSULTATION:  Multiple Myeloma  HISTORY OF PRESENTING ILLNESS:   Martin Conner is a wonderful 85 y.o. male who has been referred to [place] by Dr. Nicholas Lose for evaluation and management of his newly diagnosed Multiple Myeloma. The pt reports that he is doing well overall.   The pt reports that he has not felt differently recently as compared to his baseline 6-12 months ago. The pt notes that he had a physical in late January 2020 with his PCP and routine labs which revealed a concern for high protein. This was evaluated further and the pt was referred to my colleague Dr. Lindi Adie who has completed initial work up.  The pt denies problems passing urine, bowel abnormalities, or neuropathy. He also denies back pains.  The pt notes that he walks very little, ambulates with a cane or a walker, but can get around in his home well. He lives with his wife in his own home. The pt denies having falls, and uses a cane/walker to prevent falls. The pt notes that he has two children, and notes that they are not very involved with his medical decision making.  The pt denies heart, liver, lung, thyroid problems. He does have Afib however, and takes Eliquis.  Most recent lab results (06/03/18) of CBC w/diff is as follows: all values are WNL except for WBC at 3.8k, RBC at 3.73, HGB at 12.5, HCT at 37.4, MCV at 100.3, PLT at 145k. 06/03/18 MMP revealed all values WNL except for IgA at 2646, Total Protein at 8.7, B-globulin at 3.6, M Protein at 2.6g, and Globulin Total at 5.2. 06/03/18 Beta-2 microglobulin at 3.1 06/03/18 SFLC revealed Kappa at 1390.2, and K:L ratio at 105.32  On review of systems, pt reports stable energy levels, and denies  urination abnormalities, back pain, changes in bowel habits, abdominal pains, and any other symptoms.   On PMHx the pt reports Afib. On Social Hx the pt reports infrequent single alcoholic beverage. Denies ever smoking cigarettes. 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 On Family Hx the pt denies significant concerns.  Interval History:    Martin Conner returns today for management and evaluation of his Multiple Myeloma. The patient's last visit with [place] was on 07/26/18. The pt reports that he is doing well overall.  The pt reports that he has not developed any concerns in the interim. The pt notes that he has tolerated his first two Velcade injections very well. He denies tingling or numbness in his hands or feet and denies fevers. He notes that he has been eating well and endorses a good appetite. He enjoyed his last week very much as he was able [place].  Lab results today (08/09/18) of CBC w/diff and CMP is as follows: all values are WNL except for WBC at 2.7k, RBC at 3.67, HGB at 12.3, HCT at 37.2, MCV at 101.4, RDW at 15.9, PLT at 111k, ANC at 1.5k, Total Protein at 8.4, Albumin at 2.9, GFR at 54.  On review of systems, pt reports good energy levels, good appetite, eating well, and denies fevers, tingling or numbness in his hands or feet, abdominal pains, leg swelling, and any other symptoms.   MEDICAL  HISTORY:  Past Medical History:  Diagnosis Date   Fracture of femoral neck, right ([place]) 07/10/2015   s/p mechanical fall   Hyperlipidemia    Hypertensive heart disease    Lumbar disc disease    Osteoarthritis    Permanent atrial fibrillation    a. 07/2012 Echo: EF 55-60%, no rwma, mildly dil RA/LA.   Syncope    a. 07/2012 - unclear etiology-->resulted in right tib/fib fx req ORIF/intramedullary nail of tibia.    SURGICAL HISTORY: Past Surgical History:  Procedure Laterality Date   ANKLE SURGERY     right ankle   TIBIA IM NAIL  INSERTION Right 07/21/2012   Procedure: INTRAMEDULLARY (IM) NAIL TIBIAL;  Surgeon: Johnny Bridge, MD;  Location: [facility];  Service: Orthopedics;  Laterality: Right;   TOTAL HIP ARTHROPLASTY     left   TOTAL HIP ARTHROPLASTY Right 07/11/2015   Procedure: TOTAL HIP ARTHROPLASTY;  Surgeon: Marchia Bond, MD;  Location: [facility];  Service: Orthopedics;  Laterality: Right;    SOCIAL HISTORY: Social History   Socioeconomic History   Marital status: Married    Spouse name: Not on file   Number of children: 2   Years of education: Not on file   Highest education level: Not on file  Occupational History   Occupation: retired     Fish farm manager: [employer]  Scientist, product/process development strain: Not on file   Food insecurity:    Worry: Not on file    Inability: Not on file   Transportation needs:    Medical: Not on file    Non-medical: Not on file  Tobacco Use   Smoking status: Never Smoker   Smokeless tobacco: Never Used  Substance and Sexual Activity   Alcohol use: Yes    Alcohol/week: 12.0 standard drinks    Types: 12 Cans of beer per week    Comment: 2 beers several x/wk and occas cocktail on top of it.   Drug use: No   Sexual activity: Never  Lifestyle   Physical activity:    Days per week: Not on file    Minutes per session: Not on file   Stress: Not on file  Relationships   Social connections:    Talks on phone: Not on file    Gets together: Not on file    Attends religious service: Not on file    Active member of club or organization: Not on file    Attends meetings of clubs or organizations: Not on file    Relationship status: Not on file   Intimate partner violence:    Fear of current or ex partner: Not on file    Emotionally abused: Not on file    Physically abused: Not on file    Forced sexual activity: Not on file  Other Topics Concern   Not on file  Social History Narrative   Not sure about living will   Requests that wife make decisions for  him if needed   Would accept resuscitation but no prolonged artificial ventilation   Would not want tube feeds if cognitively unaware    FAMILY HISTORY: Family History  Problem Relation Age of Onset   Cancer Mother    Hyperlipidemia Sister    Hypertension Sister     ALLERGIES:  has No Known Allergies.  MEDICATIONS:  Current Outpatient Medications  Medication Sig Dispense Refill   Acetaminophen (TYLENOL PO) Take 1 tablet by mouth daily as needed (pain). Pt unsure of  strength     acyclovir (ZOVIRAX) 400 MG tablet Take 1 tablet (400 mg total) by mouth 2 (two) times daily. 60 tablet 3   apixaban (ELIQUIS) 2.5 MG TABS tablet Take 1 tablet (2.5 mg total) by mouth 2 (two) times daily. 180 tablet 3   losartan (COZAAR) 50 MG tablet TAKE 1 TABLET TWICE A DAY 180 tablet 3   ondansetron (ZOFRAN) 8 MG tablet Take 1 tablet (8 mg total) by mouth 2 (two) times daily as needed (Nausea or vomiting). 30 tablet 1   sennosides-docusate sodium (SENOKOT-S) 8.6-50 MG tablet Take 2 tablets by mouth daily. (Patient taking differently: Take 2 tablets by mouth as needed. ) 30 tablet 1   No current facility-administered medications for this visit.    Facility-Administered Medications Ordered in Other Visits  Medication Dose Route Frequency Provider Last Rate Last Dose   bortezomib SQ (VELCADE) chemo injection 2.5 mg  1.3 mg/m2 (Order-Specific) Subcutaneous Once Brunetta Genera, MD       dexamethasone (DECADRON) tablet 20 mg  20 mg Oral Once Brunetta Genera, MD       prochlorperazine (COMPAZINE) tablet 10 mg  10 mg Oral Once Brunetta Genera, MD        REVIEW OF SYSTEMS:    A 10+ POINT REVIEW OF SYSTEMS WAS OBTAINED including neurology, dermatology, psychiatry, cardiac, respiratory, lymph, extremities, GI, GU, Musculoskeletal, constitutional, breasts, reproductive, HEENT.  All pertinent positives are noted in the HPI.  All others are negative.   PHYSICAL EXAMINATION: ECOG  PERFORMANCE STATUS: 3 - Symptomatic, >50% confined to bed  Vitals:   08/09/18 1057  BP: (!) 157/86  Pulse: 82  Resp: 18  Temp: 98.7 F (37.1 C)  SpO2: 97%   Filed Weights   08/09/18 1057  Weight: 176 lb (79.8 kg)   .Body mass index is 25.25 kg/m.  GENERAL:alert, in no acute distress and comfortable SKIN: no acute rashes, no significant lesions EYES: conjunctiva are pink and non-injected, sclera anicteric OROPHARYNX: MMM, no exudates, no oropharyngeal erythema or ulceration NECK: supple, no JVD LYMPH:  no palpable lymphadenopathy in the cervical, axillary or inguinal regions LUNGS: clear to auscultation b/l with normal respiratory effort HEART: regular rate & rhythm ABDOMEN:  normoactive bowel sounds , non tender, not distended. No palpable hepatosplenomegaly.  Extremity: no pedal edema PSYCH: alert & oriented x 3 with fluent speech NEURO: no focal motor/sensory deficits   LABORATORY DATA:  I have reviewed the data as listed  . CBC Latest Ref Rng & Units 08/09/2018 08/02/2018 07/26/2018  WBC 4.0 - 10.5 K/uL 2.7(L) 2.6(L) 2.7(L)  Hemoglobin 13.0 - 17.0 g/dL 12.3(L) 12.5(L) 11.9(L)  Hematocrit 39.0 - 52.0 % 37.2(L) 36.6(L) 35.0(L)  Platelets 150 - 400 K/uL 111(L) 141(L) 135(L)  ANC1.5k  . CMP Latest Ref Rng & Units 08/09/2018 08/02/2018 07/26/2018  Glucose 70 - 99 mg/dL 90 115(H) 95  BUN 8 - 23 mg/dL 13 14 15   Creatinine 0.61 - 1.24 mg/dL 1.20 1.11 1.13  Sodium 135 - 145 mmol/L 138 139 139  Potassium 3.5 - 5.1 mmol/L 4.4 3.7 4.1  Chloride 98 - 111 mmol/L 103 106 107  CO2 22 - 32 mmol/L 26 21(L) 20(L)  Calcium 8.9 - 10.3 mg/dL 9.2 8.9 8.1(L)  Total Protein 6.5 - 8.1 g/dL 8.4(H) 8.8(H) 8.5(H)  Total Bilirubin 0.3 - 1.2 mg/dL 0.6 0.3 0.5  Alkaline Phos 38 - 126 U/L 41 49 44  AST 15 - 41 U/L 15 15 17   ALT 0 - 44  U/L 20 20 13     06/03/18 BM Bx:    06/03/18 Cytogenetics:      RADIOGRAPHIC STUDIES: I have personally reviewed the radiological images as listed and  agreed with the findings in the report. Nm Pet Image Initial (pi) Whole Body  Result Date: 07/20/2018 CLINICAL DATA:  Initial treatment strategy for multiple myeloma. EXAM: NUCLEAR MEDICINE PET WHOLE BODY TECHNIQUE: 9.8 mCi F-18 FDG was injected intravenously. Full-ring PET imaging was performed from the skull base to thigh after the radiotracer. CT data was obtained and used for attenuation correction and anatomic localization. Fasting blood glucose: 9 9 mg/dl COMPARISON:  None. FINDINGS: Mediastinal blood pool activity: SUV max 2.4 HEAD/NECK: No hypermetabolic activity in the scalp. No hypermetabolic cervical lymph nodes. 12 mm right thyroid nodule shows low level hypermetabolism with SUV max = 4.8. Incidental CT findings: none CHEST: No hypermetabolic mediastinal or hilar nodes. No suspicious pulmonary nodules on the CT scan. Incidental CT findings: Coronary artery calcification is evident. Atherosclerotic calcification is noted in the wall of the thoracic aorta. No mediastinal lymphadenopathy. 3 mm right middle lobe nodule seen 34/8. 3 mm right lower lobe nodule visible on 38/8. 3 mm left upper lobe nodule visible on 26/8. Other scattered 2 mm left upper lobe pulmonary nodules are evident. 2 mm left lower lobe nodule visible on 39/8. ABDOMEN/PELVIS: No abnormal hypermetabolic activity within the liver, pancreas, adrenal glands, or spleen. No hypermetabolic lymph nodes in the abdomen or pelvis. Incidental CT findings: 2 mm nonobstructing stone identified upper pole right kidney with 13 mm nonobstructing stone noted in the lower pole the right kidney. Tiny stones are seen in the left kidney without hydronephrosis. Exophytic cyst lower pole left kidney show no hypermetabolism. There is abdominal aortic atherosclerosis without aneurysm. Diverticular changes noted left colon. SKELETON: No focal hypermetabolic activity to suggest skeletal metastasis. Incidental CT findings: Bilateral hip replacement. Intramedullary  nail noted right tibia. EXTREMITIES: No abnormal hypermetabolic activity in the lower extremities. Incidental CT findings: none IMPRESSION: 1. No hypermetabolic bone disease evident. 2. 12 mm hypermetabolic right thyroid nodule, nonspecific. Thyroid ultrasound could be used to further evaluate as clinically warranted in this individual. No other unexpected hypermetabolic soft tissue disease identified on today's study. 3. Bilateral tiny pulmonary nodules measuring up to about 3 mm maximum size. Consider follow-up to ensure stability. 4.  Aortic Atherosclerois (ICD10-170.0) Electronically Signed   By: Misty Stanley M.D.   On: 07/20/2018 16:30    ASSESSMENT & PLAN:  85 y.o. male with  1. Multiple Myeloma 06/03/18 BM Biopsy revealed cellular marrow involved by plasma cell neoplasm (about 50%) 06/03/18 Cytogenetics revealed 54.5% of cells showed gain of ATM, and 51.5% of cells with loss of p53 06/03/18 Bone Survey revealed Possible 8 mm lytic lesion versus subarachnoid granulation within the parietal convexity of the calvarium. No other suspicious lytic lesions seen within the skeleton. Labs upon initial presentation from 06/03/18, M Protein at 2.6g with IgA kappa specificity. Some new mild anemia with HGB at 12.5.  07/20/18 PET/CT revealed "No hypermetabolic bone disease evident. 2. 12 mm hypermetabolic right thyroid nodule, nonspecific. Thyroid ultrasound could be used to further evaluate as clinically warranted in this individual. No other unexpected hypermetabolic soft tissue disease identified on today's study. 3. Bilateral tiny pulmonary nodules measuring up to about 3 mm maximum size. Consider follow-up to ensure stability. 4.  Aortic Atherosclerosis."  PLAN: -Discussed pt labwork today, 08/09/18; WBC stable at 2.7k, HGB stable at 12.3, PLT lower at 111k and will  continue monitoring, chemistries are stable -The pt has no prohibitive toxicities from continuing C1D15 Velcade and Dexamethasone at this  time. -Will continue to watch blood counts -Discussed balance between disease control and preserving quality of life when considering adding further medications -Will evaluate for adding additional treatment after pt completes C2, pending continued tolerance to Velcade -Will hold on bone directed therapies at this time given absence of bone tumors -Discussed again that the pt has a 17p mutation, which can be a predictor of treatment resistance and a possibly more aggressive course. -Continue Acyclovir BID -Will check labs weekly -Will see the pt back in 3 weeks   Please schedule C2 and C3 of treatment as per orders Weekly labs RTC with Dr Irene Limbo in 3 weeks   I discussed the assessment and treatment plan with the patient. The patient was provided an opportunity to ask questions and all were answered. The patient agreed with the plan and demonstrated an understanding of the instructions.    The total time spent in the appt was 20 minutes and more than 50% was on counseling and direct patient cares.    Sullivan Lone MD MS AAHIVMS [facility] Hematology/Oncology Physician [facility]  (Office):       [phone] (Work cell):  [phone] (Fax):           [phone]  08/09/2018 11:29 AM  I, Baldwin Jamaica, am acting as a scribe for Dr. Sullivan Lone.   .I have reviewed the above documentation for accuracy and completeness, and I agree with the above. Brunetta Genera MD

## 2018-08-09 ENCOUNTER — Other Ambulatory Visit: Payer: Self-pay

## 2018-08-09 ENCOUNTER — Telehealth: Payer: Self-pay | Admitting: Hematology

## 2018-08-09 ENCOUNTER — Inpatient Hospital Stay: Payer: Medicare Other

## 2018-08-09 ENCOUNTER — Inpatient Hospital Stay (HOSPITAL_BASED_OUTPATIENT_CLINIC_OR_DEPARTMENT_OTHER): Payer: Medicare Other | Admitting: Hematology

## 2018-08-09 VITALS — BP 157/86 | HR 82 | Temp 98.7°F | Resp 18 | Ht 70.0 in | Wt 176.0 lb

## 2018-08-09 DIAGNOSIS — C9000 Multiple myeloma not having achieved remission: Secondary | ICD-10-CM | POA: Diagnosis not present

## 2018-08-09 DIAGNOSIS — D649 Anemia, unspecified: Secondary | ICD-10-CM | POA: Diagnosis not present

## 2018-08-09 DIAGNOSIS — R918 Other nonspecific abnormal finding of lung field: Secondary | ICD-10-CM | POA: Diagnosis not present

## 2018-08-09 DIAGNOSIS — Z5111 Encounter for antineoplastic chemotherapy: Secondary | ICD-10-CM

## 2018-08-09 DIAGNOSIS — Z7189 Other specified counseling: Secondary | ICD-10-CM

## 2018-08-09 DIAGNOSIS — I700 Atherosclerosis of aorta: Secondary | ICD-10-CM

## 2018-08-09 DIAGNOSIS — E041 Nontoxic single thyroid nodule: Secondary | ICD-10-CM | POA: Diagnosis not present

## 2018-08-09 DIAGNOSIS — Z79899 Other long term (current) drug therapy: Secondary | ICD-10-CM

## 2018-08-09 LAB — CBC WITH DIFFERENTIAL/PLATELET
Abs Immature Granulocytes: 0.01 10*3/uL (ref 0.00–0.07)
Basophils Absolute: 0 10*3/uL (ref 0.0–0.1)
Basophils Relative: 0 %
Eosinophils Absolute: 0 10*3/uL (ref 0.0–0.5)
Eosinophils Relative: 1 %
HCT: 37.2 % — ABNORMAL LOW (ref 39.0–52.0)
Hemoglobin: 12.3 g/dL — ABNORMAL LOW (ref 13.0–17.0)
Immature Granulocytes: 0 %
Lymphocytes Relative: 34 %
Lymphs Abs: 0.9 10*3/uL (ref 0.7–4.0)
MCH: 33.5 pg (ref 26.0–34.0)
MCHC: 33.1 g/dL (ref 30.0–36.0)
MCV: 101.4 fL — ABNORMAL HIGH (ref 80.0–100.0)
Monocytes Absolute: 0.3 10*3/uL (ref 0.1–1.0)
Monocytes Relative: 9 %
Neutro Abs: 1.5 10*3/uL — ABNORMAL LOW (ref 1.7–7.7)
Neutrophils Relative %: 56 %
Platelets: 111 10*3/uL — ABNORMAL LOW (ref 150–400)
RBC: 3.67 MIL/uL — ABNORMAL LOW (ref 4.22–5.81)
RDW: 15.9 % — ABNORMAL HIGH (ref 11.5–15.5)
WBC: 2.7 10*3/uL — ABNORMAL LOW (ref 4.0–10.5)
nRBC: 0 % (ref 0.0–0.2)

## 2018-08-09 LAB — CMP (CANCER CENTER ONLY)
ALT: 20 U/L (ref 0–44)
AST: 15 U/L (ref 15–41)
Albumin: 2.9 g/dL — ABNORMAL LOW (ref 3.5–5.0)
Alkaline Phosphatase: 41 U/L (ref 38–126)
Anion gap: 9 (ref 5–15)
BUN: 13 mg/dL (ref 8–23)
CO2: 26 mmol/L (ref 22–32)
Calcium: 9.2 mg/dL (ref 8.9–10.3)
Chloride: 103 mmol/L (ref 98–111)
Creatinine: 1.2 mg/dL (ref 0.61–1.24)
GFR, Est AFR Am: >60 mL/min (ref >60)
GFR, Estimated: 54 mL/min — ABNORMAL LOW (ref >60)
Glucose, Bld: 90 mg/dL (ref 70–99)
Potassium: 4.4 mmol/L (ref 3.5–5.1)
Sodium: 138 mmol/L (ref 135–145)
Total Bilirubin: 0.6 mg/dL (ref 0.3–1.2)
Total Protein: 8.4 g/dL — ABNORMAL HIGH (ref 6.5–8.1)

## 2018-08-09 MED ORDER — DEXAMETHASONE 4 MG PO TABS
20.0000 mg | ORAL_TABLET | Freq: Once | ORAL | Status: AC
  Administered 2018-08-09: 20 mg via ORAL

## 2018-08-09 MED ORDER — BORTEZOMIB CHEMO SQ INJECTION 3.5 MG (2.5MG/ML)
1.3000 mg/m2 | Freq: Once | INTRAMUSCULAR | Status: AC
  Administered 2018-08-09: 12:00:00 2.5 mg via SUBCUTANEOUS
  Filled 2018-08-09: qty 1

## 2018-08-09 MED ORDER — PROCHLORPERAZINE MALEATE 10 MG PO TABS
ORAL_TABLET | ORAL | Status: AC
  Filled 2018-08-09: qty 1

## 2018-08-09 MED ORDER — DEXAMETHASONE 4 MG PO TABS
ORAL_TABLET | ORAL | Status: AC
  Filled 2018-08-09: qty 5

## 2018-08-09 MED ORDER — PROCHLORPERAZINE MALEATE 10 MG PO TABS
10.0000 mg | ORAL_TABLET | Freq: Once | ORAL | Status: AC
  Administered 2018-08-09: 10 mg via ORAL

## 2018-08-09 NOTE — Patient Instructions (Signed)

## 2018-08-09 NOTE — Telephone Encounter (Signed)
Per 5/18 los, appts already scheduled.

## 2018-08-17 ENCOUNTER — Other Ambulatory Visit: Payer: Self-pay

## 2018-08-17 ENCOUNTER — Other Ambulatory Visit: Payer: Self-pay | Admitting: Hematology

## 2018-08-17 ENCOUNTER — Inpatient Hospital Stay: Payer: Medicare Other

## 2018-08-17 VITALS — BP 135/69 | HR 84 | Temp 98.7°F | Resp 18 | Wt 174.8 lb

## 2018-08-17 DIAGNOSIS — Z5111 Encounter for antineoplastic chemotherapy: Secondary | ICD-10-CM | POA: Diagnosis not present

## 2018-08-17 DIAGNOSIS — C9000 Multiple myeloma not having achieved remission: Secondary | ICD-10-CM

## 2018-08-17 DIAGNOSIS — Z7189 Other specified counseling: Secondary | ICD-10-CM

## 2018-08-17 LAB — CBC WITH DIFFERENTIAL/PLATELET
Abs Immature Granulocytes: 0.01 10*3/uL (ref 0.00–0.07)
Basophils Absolute: 0 10*3/uL (ref 0.0–0.1)
Basophils Relative: 0 %
Eosinophils Absolute: 0 10*3/uL (ref 0.0–0.5)
Eosinophils Relative: 1 %
HCT: 38.6 % — ABNORMAL LOW (ref 39.0–52.0)
Hemoglobin: 13.1 g/dL (ref 13.0–17.0)
Immature Granulocytes: 0 %
Lymphocytes Relative: 30 %
Lymphs Abs: 0.8 10*3/uL (ref 0.7–4.0)
MCH: 34.4 pg — ABNORMAL HIGH (ref 26.0–34.0)
MCHC: 33.9 g/dL (ref 30.0–36.0)
MCV: 101.3 fL — ABNORMAL HIGH (ref 80.0–100.0)
Monocytes Absolute: 0.2 10*3/uL (ref 0.1–1.0)
Monocytes Relative: 7 %
Neutro Abs: 1.6 10*3/uL — ABNORMAL LOW (ref 1.7–7.7)
Neutrophils Relative %: 62 %
Platelets: 94 10*3/uL — ABNORMAL LOW (ref 150–400)
RBC: 3.81 MIL/uL — ABNORMAL LOW (ref 4.22–5.81)
RDW: 16.1 % — ABNORMAL HIGH (ref 11.5–15.5)
WBC: 2.6 10*3/uL — ABNORMAL LOW (ref 4.0–10.5)
nRBC: 0 % (ref 0.0–0.2)

## 2018-08-17 LAB — CMP (CANCER CENTER ONLY)
ALT: 17 U/L (ref 0–44)
AST: 15 U/L (ref 15–41)
Albumin: 3.1 g/dL — ABNORMAL LOW (ref 3.5–5.0)
Alkaline Phosphatase: 45 U/L (ref 38–126)
Anion gap: 11 (ref 5–15)
BUN: 19 mg/dL (ref 8–23)
CO2: 24 mmol/L (ref 22–32)
Calcium: 9.6 mg/dL (ref 8.9–10.3)
Chloride: 103 mmol/L (ref 98–111)
Creatinine: 1.18 mg/dL (ref 0.61–1.24)
GFR, Est AFR Am: >60 mL/min (ref >60)
GFR, Estimated: 55 mL/min — ABNORMAL LOW (ref >60)
Glucose, Bld: 98 mg/dL (ref 70–99)
Potassium: 4.5 mmol/L (ref 3.5–5.1)
Sodium: 138 mmol/L (ref 135–145)
Total Bilirubin: 0.6 mg/dL (ref 0.3–1.2)
Total Protein: 8.6 g/dL — ABNORMAL HIGH (ref 6.5–8.1)

## 2018-08-17 MED ORDER — PROCHLORPERAZINE MALEATE 10 MG PO TABS
10.0000 mg | ORAL_TABLET | Freq: Once | ORAL | Status: AC
  Administered 2018-08-17: 09:00:00 10 mg via ORAL

## 2018-08-17 MED ORDER — DEXAMETHASONE 4 MG PO TABS
20.0000 mg | ORAL_TABLET | Freq: Once | ORAL | Status: AC
  Administered 2018-08-17: 09:00:00 20 mg via ORAL

## 2018-08-17 MED ORDER — BORTEZOMIB CHEMO SQ INJECTION 3.5 MG (2.5MG/ML)
1.3000 mg/m2 | Freq: Once | INTRAMUSCULAR | Status: AC
  Administered 2018-08-17: 10:00:00 2.5 mg via SUBCUTANEOUS
  Filled 2018-08-17: qty 1

## 2018-08-17 MED ORDER — DEXAMETHASONE 4 MG PO TABS
ORAL_TABLET | ORAL | Status: AC
  Filled 2018-08-17: qty 5

## 2018-08-17 MED ORDER — PROCHLORPERAZINE MALEATE 10 MG PO TABS
ORAL_TABLET | ORAL | Status: AC
  Filled 2018-08-17: qty 1

## 2018-08-17 NOTE — Patient Instructions (Addendum)
Bortezomib injection What is this medicine? BORTEZOMIB (bor TEZ oh mib) is a medicine that targets proteins in cancer cells and stops the cancer cells from growing. It is used to treat multiple myeloma and mantle-cell lymphoma. This medicine may be used for other purposes; ask your health care provider or pharmacist if you have questions. COMMON BRAND NAME(S): Velcade What should I tell my health care provider before I take this medicine? They need to know if you have any of these conditions: -diabetes -heart disease -irregular heartbeat -liver disease -on hemodialysis -low blood counts, like low white blood cells, platelets, or hemoglobin -peripheral neuropathy -taking medicine for blood pressure -an unusual or allergic reaction to bortezomib, mannitol, boron, other medicines, foods, dyes, or preservatives -pregnant or trying to get pregnant -breast-feeding How should I use this medicine? This medicine is for injection into a vein or for injection under the skin. It is given by a health care professional in a hospital or clinic setting. Talk to your pediatrician regarding the use of this medicine in children. Special care may be needed. Overdosage: If you think you have taken too much of this medicine contact a poison control center or emergency room at once. NOTE: This medicine is only for you. Do not share this medicine with others. What if I miss a dose? It is important not to miss your dose. Call your doctor or health care professional if you are unable to keep an appointment. What may interact with this medicine? This medicine may interact with the following medications: -ketoconazole -rifampin -ritonavir -St. John's Wort This list may not describe all possible interactions. Give your health care provider a list of all the medicines, herbs, non-prescription drugs, or dietary supplements you use. Also tell them if you smoke, drink alcohol, or use illegal drugs. Some items may  interact with your medicine. What should I watch for while using this medicine? You may get drowsy or dizzy. Do not drive, use machinery, or do anything that needs mental alertness until you know how this medicine affects you. Do not stand or sit up quickly, especially if you are an older patient. This reduces the risk of dizzy or fainting spells. In some cases, you may be given additional medicines to help with side effects. Follow all directions for their use. Call your doctor or health care professional for advice if you get a fever, chills or sore throat, or other symptoms of a cold or flu. Do not treat yourself. This drug decreases your body's ability to fight infections. Try to avoid being around people who are sick. This medicine may increase your risk to bruise or bleed. Call your doctor or health care professional if you notice any unusual bleeding. You may need blood work done while you are taking this medicine. In some patients, this medicine may cause a serious brain infection that may cause death. If you have any problems seeing, thinking, speaking, walking, or standing, tell your doctor right away. If you cannot reach your doctor, urgently seek other source of medical care. Check with your doctor or health care professional if you get an attack of severe diarrhea, nausea and vomiting, or if you sweat a lot. The loss of too much body fluid can make it dangerous for you to take this medicine. Do not become pregnant while taking this medicine or for at least 7 months after stopping it. Women should inform their doctor if they wish to become pregnant or think they might be pregnant. Men should not   father a child while taking this medicine and for at least 4 months after stopping it. There is a potential for serious side effects to an unborn child. Talk to your health care professional or pharmacist for more information. Do not breast-feed an infant while taking this medicine or for 2 months after  stopping it. This medicine may interfere with the ability to have a child. You should talk with your doctor or health care professional if you are concerned about your fertility. What side effects may I notice from receiving this medicine? Side effects that you should report to your doctor or health care professional as soon as possible: -allergic reactions like skin rash, itching or hives, swelling of the face, lips, or tongue -breathing problems -changes in hearing -changes in vision -fast, irregular heartbeat -feeling faint or lightheaded, falls -pain, tingling, numbness in the hands or feet -right upper belly pain -seizures -swelling of the ankles, feet, hands -unusual bleeding or bruising -unusually weak or tired -vomiting -yellowing of the eyes or skin Side effects that usually do not require medical attention (report to your doctor or health care professional if they continue or are bothersome): -changes in emotions or moods -constipation -diarrhea -loss of appetite -headache -irritation at site where injected -nausea This list may not describe all possible side effects. Call your doctor for medical advice about side effects. You may report side effects to FDA at 1-800-FDA-1088. Where should I keep my medicine? This drug is given in a hospital or clinic and will not be stored at home. NOTE: This sheet is a summary. It may not cover all possible information. If you have questions about this medicine, talk to your doctor, pharmacist, or health care provider.  2019 Elsevier/Gold Standard (2017-07-20 16:29:31)   Thrombocytopenia  Thrombocytopenia means that you have a low number of platelets in your blood. Platelets are tiny cells in the blood. When you bleed, they clump together at the cut or injury to stop the bleeding. This is called blood clotting. Not having enough platelets can cause bleeding problems. Follow these instructions at home: General instructions  Check  your skin and inside your mouth for bruises or blood as told by your doctor.  Check to see if there is blood in your spit (sputum), pee (urine), and poop (stool). Do this as told by your doctor.  Ask your doctor if you can drink alcohol.  Take over-the-counter and prescription medicines only as told by your doctor.  Tell all of your doctors that you have this condition. Be sure to tell your dentist and eye doctor too. Activity  Do not do activities that can cause bumps or bruises until your doctor says it is okay.  Be careful not to cut yourself: ? When you shave. ? When you use scissors, needles, knives, or other tools.  Be careful not to burn yourself: ? When you use an iron. ? When you cook. Contact a doctor if:  You have bruises and you do not know why. Get help right away if:  You are bleeding anywhere on your body.  You have blood in your spit, pee, or poop. This information is not intended to replace advice given to you by your health care provider. Make sure you discuss any questions you have with your health care provider. Document Released: 02/27/2011 Document Revised: 11/11/2015 Document Reviewed: 09/11/2014 Elsevier Interactive Patient Education  2019 [employer].

## 2018-08-17 NOTE — Progress Notes (Signed)
Per Dr. Irene Limbo, ok to treat with Platelets 94.  Pt brought in note from wife.  In note, wife stated concern about being able to take care of Mr. Strine and is requesting home care. Dr. Irene Limbo and Dr. Grier Mitts nurse notified. Appointment added for patient to see Dr. Irene Limbo on Monday 08/23/18 and will also include wife during visit (Wife will be on phone during visit). Pt given AVS with note for wife stating for her to call office with any questions or concerns.

## 2018-08-19 ENCOUNTER — Telehealth: Payer: Self-pay | Admitting: Hematology

## 2018-08-19 NOTE — Telephone Encounter (Signed)
Per 5/26 schedule message added f/u to 6/1 lab/chemo. In order for lab/fu/chemo to be arranged times had to be changed. Added lab 12:15 pm, f/u 12:40 pm and chemo 2:15 pm.   Left message for patient and also routed message to provider re times.

## 2018-08-20 NOTE — Progress Notes (Signed)
HEMATOLOGY/ONCOLOGY CLINIC NOTE  Date of Service: 08/23/2018  Patient Care Team: Venia Carbon, MD as PCP - Bethena Roys, MD as Consulting Physician (Cardiology) Marchia Bond, MD as Consulting Physician (Orthopedic Surgery)  CHIEF COMPLAINTS/PURPOSE OF CONSULTATION:  Multiple Myeloma  HISTORY OF PRESENTING ILLNESS:   Martin Conner is a wonderful 85 y.o. male who has been referred to [place] by Dr. Nicholas Lose for evaluation and management of his newly diagnosed Multiple Myeloma. The pt reports that he is doing well overall.   The pt reports that he has not felt differently recently as compared to his baseline 6-12 months ago. The pt notes that he had a physical in late January 2020 with his PCP and routine labs which revealed a concern for high protein. This was evaluated further and the pt was referred to my colleague Dr. Lindi Adie who has completed initial work up.  The pt denies problems passing urine, bowel abnormalities, or neuropathy. He also denies back pains.  The pt notes that he walks very little, ambulates with a cane or a walker, but can get around in his home well. He lives with his wife in his own home. The pt denies having falls, and uses a cane/walker to prevent falls. The pt notes that he has two children, and notes that they are not very involved with his medical decision making.  The pt denies heart, liver, lung, thyroid problems. He does have Afib however, and takes Eliquis.  Most recent lab results (06/03/18) of CBC w/diff is as follows: all values are WNL except for WBC at 3.8k, RBC at 3.73, HGB at 12.5, HCT at 37.4, MCV at 100.3, PLT at 145k. 06/03/18 MMP revealed all values WNL except for IgA at 2646, Total Protein at 8.7, B-globulin at 3.6, M Protein at 2.6g, and Globulin Total at 5.2. 06/03/18 Beta-2 microglobulin at 3.1 06/03/18 SFLC revealed Kappa at 1390.2, and K:L ratio at 105.32  On review of systems, pt reports stable energy levels, and denies  urination abnormalities, back pain, changes in bowel habits, abdominal pains, and any other symptoms.   On PMHx the pt reports Afib. On Social Hx the pt reports infrequent single alcoholic beverage. Denies ever smoking cigarettes. 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 On Family Hx the pt denies significant concerns.  Interval History:    Martin Conner returns today for management and evaluation of his Multiple Myeloma. The patient's last visit with [place] was on 08/09/18. The pt reports that he is doing well overall. The pt is accompanied by his daughter Ria Comment, and his wife via Social worker.  The pt has completed C1 Velcade and Dexamethasone in the interim. Today is C2D8.   The pt reports that he is enjoying good energy levels, and has had no changes in energy levels. He denies fevers, chills, night sweats, neuropathy, skin rashes, or bowel changes. He notes that he is trying to stay hydrated, but his daughter notes that he is not staying very hydrated. His daughter notes that the pt had some difficulty passing urine a few days ago. The pt notes that he feels that he is functioning the same as he was prior to treatment.  The pt's wife notes that he fell in the bathroom two weeks ago, after missing the toilet while attempting to sit down. He notes that he no longer has any soreness on his tailbone. He denies any back pain. He notes that he is walking well with a  cane.  Lab results today (08/23/18) of CBC w/diff and CMP is as follows: all values are WNL except for WBC at 2.0k, RBC at 3.71, HGB at 12.7, HCT at 37.1, MCH at 34.2, RDW at 15.7, PLT at 75k, nRBC at 2.0%, ANC at 1.2k, Lymphs abs at 600, Total Protein at 8.6, Albumin at 3.2, GFR at 59. 08/23/18 MMP and SFLC are pending  On review of systems, pt reports good energy levels, stable energy levels, and denies fevers, chills, night sweats, neuropathy, skin rashes, bowel changes, back pains, mouth sores, abdominal pains, new or different back  pains, leg swelling, and any other symptoms.   MEDICAL HISTORY:  Past Medical History:  Diagnosis Date  . Fracture of femoral neck, right ([place]) 07/10/2015   s/p mechanical fall  . Hyperlipidemia   . Hypertensive heart disease   . Lumbar disc disease   . Osteoarthritis   . Permanent atrial fibrillation    a. 07/2012 Echo: EF 55-60%, no rwma, mildly dil RA/LA.  Marland Kitchen Syncope    a. 07/2012 - unclear etiology-->resulted in right tib/fib fx req ORIF/intramedullary nail of tibia.    SURGICAL HISTORY: Past Surgical History:  Procedure Laterality Date  . ANKLE SURGERY     right ankle  . TIBIA IM NAIL INSERTION Right 07/21/2012   Procedure: INTRAMEDULLARY (IM) NAIL TIBIAL;  Surgeon: Johnny Bridge, MD;  Location: [place];  Service: Orthopedics;  Laterality: Right;  . TOTAL HIP ARTHROPLASTY     left  . TOTAL HIP ARTHROPLASTY Right 07/11/2015   Procedure: TOTAL HIP ARTHROPLASTY;  Surgeon: Marchia Bond, MD;  Location: [place];  Service: Orthopedics;  Laterality: Right;    SOCIAL HISTORY: Social History   Socioeconomic History  . Marital status: Married    Spouse name: Not on file  . Number of children: 2  . Years of education: Not on file  . Highest education level: Not on file  Occupational History  . Occupation: retired     Fish farm manager: [employer]  . Financial resource strain: Not on file  . Food insecurity:    Worry: Not on file    Inability: Not on file  . Transportation needs:    Medical: Not on file    Non-medical: Not on file  Tobacco Use  . Smoking status: Never Smoker  . Smokeless tobacco: Never Used  Substance and Sexual Activity  . Alcohol use: Yes    Alcohol/week: 12.0 standard drinks    Types: 12 Cans of beer per week    Comment: 2 beers several x/wk and occas cocktail on top of it.  . Drug use: No  . Sexual activity: Never  Lifestyle  . Physical activity:    Days per week: Not on file    Minutes per session: Not on file  . Stress: Not on file   Relationships  . Social connections:    Talks on phone: Not on file    Gets together: Not on file    Attends religious service: Not on file    Active member of club or organization: Not on file    Attends meetings of clubs or organizations: Not on file    Relationship status: Not on file  . Intimate partner violence:    Fear of current or ex partner: Not on file    Emotionally abused: Not on file    Physically abused: Not on file    Forced sexual activity: Not on file  Other Topics Concern  .  Not on file  Social History Narrative   Not sure about living will   Requests that wife make decisions for him if needed   Would accept resuscitation but no prolonged artificial ventilation   Would not want tube feeds if cognitively unaware    FAMILY HISTORY: Family History  Problem Relation Age of Onset  . Cancer Mother   . Hyperlipidemia Sister   . Hypertension Sister     ALLERGIES:  has No Known Allergies.  MEDICATIONS:  Current Outpatient Medications  Medication Sig Dispense Refill  . Acetaminophen (TYLENOL PO) Take 1 tablet by mouth daily as needed (pain). Pt unsure of strength    . acyclovir (ZOVIRAX) 400 MG tablet Take 1 tablet (400 mg total) by mouth 2 (two) times daily. 60 tablet 3  . apixaban (ELIQUIS) 2.5 MG TABS tablet Take 1 tablet (2.5 mg total) by mouth 2 (two) times daily. 180 tablet 3  . losartan (COZAAR) 50 MG tablet TAKE 1 TABLET TWICE A DAY 180 tablet 3  . ondansetron (ZOFRAN) 8 MG tablet Take 1 tablet (8 mg total) by mouth 2 (two) times daily as needed (Nausea or vomiting). 30 tablet 1  . sennosides-docusate sodium (SENOKOT-S) 8.6-50 MG tablet Take 2 tablets by mouth daily. (Patient taking differently: Take 2 tablets by mouth as needed. ) 30 tablet 1   No current facility-administered medications for this visit.     REVIEW OF SYSTEMS:    A 10+ POINT REVIEW OF SYSTEMS WAS OBTAINED including neurology, dermatology, psychiatry, cardiac, respiratory, lymph,  extremities, GI, GU, Musculoskeletal, constitutional, breasts, reproductive, HEENT.  All pertinent positives are noted in the HPI.  All others are negative.   PHYSICAL EXAMINATION: ECOG PERFORMANCE STATUS: 3 - Symptomatic, >50% confined to bed  Vitals:   08/23/18 1247  BP: 140/78  Pulse: 74  Resp: 17  Temp: 98.7 F (37.1 C)  SpO2: 99%   Filed Weights   08/23/18 1247  Weight: 170 lb 12.8 oz (77.5 kg)   .Body mass index is 24.51 kg/m.  GENERAL:alert, in no acute distress and comfortable SKIN: no acute rashes, no significant lesions EYES: conjunctiva are pink and non-injected, sclera anicteric OROPHARYNX: MMM, no exudates, no oropharyngeal erythema or ulceration NECK: supple, no JVD LYMPH:  no palpable lymphadenopathy in the cervical, axillary or inguinal regions LUNGS: clear to auscultation b/l with normal respiratory effort HEART: regular rate & rhythm ABDOMEN:  normoactive bowel sounds , non tender, not distended. No palpable hepatosplenomegaly.  Extremity: no pedal edema PSYCH: alert & oriented x 3 with fluent speech NEURO: no focal motor/sensory deficits   LABORATORY DATA:  I have reviewed the data as listed  . CBC Latest Ref Rng & Units 08/23/2018 08/17/2018 08/09/2018  WBC 4.0 - 10.5 K/uL 2.0(L) 2.6(L) 2.7(L)  Hemoglobin 13.0 - 17.0 g/dL 12.7(L) 13.1 12.3(L)  Hematocrit 39.0 - 52.0 % 37.1(L) 38.6(L) 37.2(L)  Platelets 150 - 400 K/uL 75(L) 94(L) 111(L)  ANC1.5k  . CMP Latest Ref Rng & Units 08/23/2018 08/17/2018 08/09/2018  Glucose 70 - 99 mg/dL 75 98 90  BUN 8 - 23 mg/dL _0 Creatinine 0.61 - 1.24 mg/dL 1.11 1.18 1.20  Sodium 135 - 145 mmol/L 137 138 138  Potassium 3.5 - 5.1 mmol/L 4.3 4.5 4.4  Chloride 98 - 111 mmol/L 103 103 103  CO2 22 - 32 mmol/L _1 Calcium 8.9 - 10.3 mg/dL 10.0 9.6 9.2  Total Protein 6.5 - 8.1 g/dL 8.6(H) 8.6(H) 8.4(H)  Total Bilirubin 0.3 -  1.2 mg/dL 0.5 0.6 0.6  Alkaline Phos 38 - 126 U/L 47 45 41  AST 15 - 41 U/L _0 ALT 0 - 44 U/L _1 06/03/18 BM Bx:    06/03/18 Cytogenetics:      RADIOGRAPHIC STUDIES: I have personally reviewed the radiological images as listed and agreed with the findings in the report. No results found.  ASSESSMENT & PLAN:  85 y.o. male with  1. Multiple Myeloma 06/03/18 BM Biopsy revealed cellular marrow involved by plasma cell neoplasm (about 50%) 06/03/18 Cytogenetics revealed 54.5% of cells showed gain of ATM, and 51.5% of cells with loss of p53 06/03/18 Bone Survey revealed Possible 8 mm lytic lesion versus subarachnoid granulation within the parietal convexity of the calvarium. No other suspicious lytic lesions seen within the skeleton. Labs upon initial presentation from 06/03/18, M Protein at 2.6g with IgA kappa specificity. Some new mild anemia with HGB at 12.5.  07/20/18 PET/CT revealed "No hypermetabolic bone disease evident. 2. 12 mm hypermetabolic right thyroid nodule, nonspecific. Thyroid ultrasound could be used to further evaluate as clinically warranted in this individual. No other unexpected hypermetabolic soft tissue disease identified on today's study. 3. Bilateral tiny pulmonary nodules measuring up to about 3 mm maximum size. Consider follow-up to ensure stability. 4.  Aortic Atherosclerosis."  PLAN: -Discussed pt labwork today, 08/23/18; some leukopenia with WBC at 2.0k, HGB stable at 12.7, PLT holding at 75k. Will continue watching labs closely.  -08/23/18 MMP is pending. 08/02/18 M Protein improved to 2.2g from 3.1g -Will decrease dose of Velcade to 50m/m2 due to cytopenias.  -The pt has no prohibitive toxicities from continuing C2D8 dose reduced Velcade and Dexamethasone at this time. -Will order urine study to rule out UTI -Recommend that the pt consume much more water -Advised walking with cane consistently to ambulate -Discussed balance between disease control and preserving quality of life when considering adding further medications -Will hold  on bone directed therapies at this time given absence of bone tumors -Discussed again that the pt has a 17p mutation, which can be a predictor of treatment resistance and a possibly more aggressive course. -Continue Acyclovir BID -Advised infection prevention strategies, avoiding public spaces, and frequent hand washing -Will see the pt back in 4 weeks   Please schedule C3 of treatment Labs with each treatment RTC with Dr KIrene LimboC3D8 of treatment   I discussed the assessment and treatment plan with the patient. The patient was provided an opportunity to ask questions and all were answered. The patient agreed with the plan and demonstrated an understanding of the instructions.    The total time spent in the appt was 30 minutes and more than 50% was on counseling and direct patient cares.    GSullivan LoneMD MS AAHIVMS SWestchester General HospitalCVa Medical Center - SacramentoHematology/Oncology Physician CPione[facility] (Office):       [phone](Work cell):  [phone](Fax):           [phone] 08/23/2018 1:31 PM  I, SBaldwin Jamaica am acting as a scribe for Dr. GSullivan Lone   .I have reviewed the above documentation for accuracy and completeness, and I agree with the above. .Brunetta GeneraMD

## 2018-08-23 ENCOUNTER — Inpatient Hospital Stay: Payer: Medicare Other | Attending: Hematology and Oncology | Admitting: Hematology

## 2018-08-23 ENCOUNTER — Other Ambulatory Visit: Payer: Self-pay

## 2018-08-23 ENCOUNTER — Encounter: Payer: Self-pay | Admitting: Hematology

## 2018-08-23 ENCOUNTER — Other Ambulatory Visit: Payer: Medicare Other

## 2018-08-23 ENCOUNTER — Inpatient Hospital Stay: Payer: Medicare Other

## 2018-08-23 ENCOUNTER — Ambulatory Visit: Payer: Medicare Other

## 2018-08-23 VITALS — BP 140/78 | HR 74 | Temp 98.7°F | Resp 17 | Ht 70.0 in | Wt 170.8 lb

## 2018-08-23 DIAGNOSIS — Z5112 Encounter for antineoplastic immunotherapy: Secondary | ICD-10-CM | POA: Insufficient documentation

## 2018-08-23 DIAGNOSIS — D61810 Antineoplastic chemotherapy induced pancytopenia: Secondary | ICD-10-CM | POA: Diagnosis not present

## 2018-08-23 DIAGNOSIS — E041 Nontoxic single thyroid nodule: Secondary | ICD-10-CM | POA: Diagnosis not present

## 2018-08-23 DIAGNOSIS — R918 Other nonspecific abnormal finding of lung field: Secondary | ICD-10-CM | POA: Insufficient documentation

## 2018-08-23 DIAGNOSIS — Z5111 Encounter for antineoplastic chemotherapy: Secondary | ICD-10-CM

## 2018-08-23 DIAGNOSIS — C9000 Multiple myeloma not having achieved remission: Secondary | ICD-10-CM

## 2018-08-23 DIAGNOSIS — R300 Dysuria: Secondary | ICD-10-CM | POA: Insufficient documentation

## 2018-08-23 DIAGNOSIS — I700 Atherosclerosis of aorta: Secondary | ICD-10-CM | POA: Insufficient documentation

## 2018-08-23 DIAGNOSIS — D472 Monoclonal gammopathy: Secondary | ICD-10-CM

## 2018-08-23 DIAGNOSIS — Z7189 Other specified counseling: Secondary | ICD-10-CM

## 2018-08-23 LAB — CBC WITH DIFFERENTIAL (CANCER CENTER ONLY)
Abs Immature Granulocytes: 0.01 10*3/uL (ref 0.00–0.07)
Basophils Absolute: 0 10*3/uL (ref 0.0–0.1)
Basophils Relative: 1 %
Eosinophils Absolute: 0 10*3/uL (ref 0.0–0.5)
Eosinophils Relative: 1 %
HCT: 37.1 % — ABNORMAL LOW (ref 39.0–52.0)
Hemoglobin: 12.7 g/dL — ABNORMAL LOW (ref 13.0–17.0)
Immature Granulocytes: 1 %
Lymphocytes Relative: 32 %
Lymphs Abs: 0.6 10*3/uL — ABNORMAL LOW (ref 0.7–4.0)
MCH: 34.2 pg — ABNORMAL HIGH (ref 26.0–34.0)
MCHC: 34.2 g/dL (ref 30.0–36.0)
MCV: 100 fL (ref 80.0–100.0)
Monocytes Absolute: 0.2 10*3/uL (ref 0.1–1.0)
Monocytes Relative: 8 %
Neutro Abs: 1.2 10*3/uL — ABNORMAL LOW (ref 1.7–7.7)
Neutrophils Relative %: 57 %
Platelet Count: 75 10*3/uL — ABNORMAL LOW (ref 150–400)
RBC: 3.71 MIL/uL — ABNORMAL LOW (ref 4.22–5.81)
RDW: 15.7 % — ABNORMAL HIGH (ref 11.5–15.5)
WBC Count: 2 10*3/uL — ABNORMAL LOW (ref 4.0–10.5)
nRBC: 2 % — ABNORMAL HIGH (ref 0.0–0.2)

## 2018-08-23 LAB — CMP (CANCER CENTER ONLY)
ALT: 18 U/L (ref 0–44)
AST: 16 U/L (ref 15–41)
Albumin: 3.2 g/dL — ABNORMAL LOW (ref 3.5–5.0)
Alkaline Phosphatase: 47 U/L (ref 38–126)
Anion gap: 10 (ref 5–15)
BUN: 20 mg/dL (ref 8–23)
CO2: 24 mmol/L (ref 22–32)
Calcium: 10 mg/dL (ref 8.9–10.3)
Chloride: 103 mmol/L (ref 98–111)
Creatinine: 1.11 mg/dL (ref 0.61–1.24)
GFR, Est AFR Am: >60 mL/min (ref >60)
GFR, Estimated: 59 mL/min — ABNORMAL LOW (ref >60)
Glucose, Bld: 75 mg/dL (ref 70–99)
Potassium: 4.3 mmol/L (ref 3.5–5.1)
Sodium: 137 mmol/L (ref 135–145)
Total Bilirubin: 0.5 mg/dL (ref 0.3–1.2)
Total Protein: 8.6 g/dL — ABNORMAL HIGH (ref 6.5–8.1)

## 2018-08-23 MED ORDER — DEXAMETHASONE 4 MG PO TABS
ORAL_TABLET | ORAL | Status: AC
  Filled 2018-08-23: qty 5

## 2018-08-23 MED ORDER — PROCHLORPERAZINE MALEATE 10 MG PO TABS
10.0000 mg | ORAL_TABLET | Freq: Once | ORAL | Status: AC
  Administered 2018-08-23: 10 mg via ORAL

## 2018-08-23 MED ORDER — PROCHLORPERAZINE MALEATE 10 MG PO TABS
ORAL_TABLET | ORAL | Status: AC
  Filled 2018-08-23: qty 1

## 2018-08-23 MED ORDER — DEXAMETHASONE 4 MG PO TABS
20.0000 mg | ORAL_TABLET | Freq: Once | ORAL | Status: AC
  Administered 2018-08-23: 20 mg via ORAL

## 2018-08-23 MED ORDER — BORTEZOMIB CHEMO SQ INJECTION 3.5 MG (2.5MG/ML)
1.0000 mg/m2 | Freq: Once | INTRAMUSCULAR | Status: AC
  Administered 2018-08-23: 2 mg via SUBCUTANEOUS
  Filled 2018-08-23: qty 0.8

## 2018-08-23 NOTE — Patient Instructions (Signed)
[facility] Discharge Instructions for Patients Receiving Chemotherapy  Today you received the following chemotherapy agents: Bortezomib (VELCADE).  To help prevent nausea and vomiting after your treatment, we encourage you to take your nausea medication as prescribed.   If you develop nausea and vomiting that is not controlled by your nausea medication, call the clinic.   BELOW ARE SYMPTOMS THAT SHOULD BE REPORTED IMMEDIATELY:  *FEVER GREATER THAN 100.5 F  *CHILLS WITH OR WITHOUT FEVER  NAUSEA AND VOMITING THAT IS NOT CONTROLLED WITH YOUR NAUSEA MEDICATION  *UNUSUAL SHORTNESS OF BREATH  *UNUSUAL BRUISING OR BLEEDING  TENDERNESS IN MOUTH AND THROAT WITH OR WITHOUT PRESENCE OF ULCERS  *URINARY PROBLEMS  *BOWEL PROBLEMS  UNUSUAL RASH Items with * indicate a potential emergency and should be followed up as soon as possible.  Feel free to call the clinic should you have any questions or concerns. The clinic phone number is [phone].  Please show the CHEMO ALERT CARD at check-in to the Emergency Department and triage nurse.   Coronavirus (COVID-19) Are you at risk?  Are you at risk for the Coronavirus (COVID-19)?  To be considered HIGH RISK for Coronavirus (COVID-19), you have to meet the following criteria:  . Traveled to [place], [place], [place], [place] or [place]; or in the [place] to [place], [place], [place], or [place]; and have fever, cough, and shortness of breath within the last 2 weeks of travel OR . Been in close contact with a person diagnosed with COVID-19 within the last 2 weeks and have fever, cough, and shortness of breath . IF YOU DO NOT MEET THESE CRITERIA, YOU ARE CONSIDERED LOW RISK FOR COVID-19.  What to do if you are HIGH RISK for COVID-19?  . If you are having a medical emergency, call 911. . Seek medical care right away. Before you go to a doctor's office, urgent care or emergency department, call ahead and tell them  about your recent travel, contact with someone diagnosed with COVID-19, and your symptoms. You should receive instructions from your physician's office regarding next steps of care.  . When you arrive at healthcare provider, tell the healthcare staff immediately you have returned from visiting [place], [place], [place], [place] or [place]; or traveled in the [place] to [place], [place], [place], or [place]; in the last two weeks or you have been in close contact with a person diagnosed with COVID-19 in the last 2 weeks.   . Tell the health care staff about your symptoms: fever, cough and shortness of breath. . After you have been seen by a medical provider, you will be either: o Tested for (COVID-19) and discharged home on quarantine except to seek medical care if symptoms worsen, and asked to  - Stay home and avoid contact with others until you get your results (4-5 days)  - Avoid travel on public transportation if possible (such as bus, train, or airplane) or o Sent to the Emergency Department by EMS for evaluation, COVID-19 testing, and possible admission depending on your condition and test results.  What to do if you are LOW RISK for COVID-19?  Reduce your risk of any infection by using the same precautions used for avoiding the common cold or flu:  . Wash your hands often with soap and warm water for at least 20 seconds.  If soap and water are not readily available, use an alcohol-based hand sanitizer with at least 60% alcohol.  . If coughing   or sneezing, cover your mouth and nose by coughing or sneezing into the elbow areas of your shirt or coat, into a tissue or into your sleeve (not your hands). . Avoid shaking hands with others and consider head nods or verbal greetings only. . Avoid touching your eyes, nose, or mouth with unwashed hands.  . Avoid close contact with people who are sick. . Avoid places or events with large numbers of people in one location, like concerts or  sporting events. . Carefully consider travel plans you have or are making. . If you are planning any travel outside or inside the [place], visit the CDC's Travelers' Health webpage for the latest health notices. . If you have some symptoms but not all symptoms, continue to monitor at home and seek medical attention if your symptoms worsen. . If you are having a medical emergency, call 911.   ADDITIONAL HEALTHCARE OPTIONS FOR PATIENTS  [facility] / e-Visit: [email or web address]         [facility]: [phone]  [facility]: [phone]                   [facility]: [phone]   

## 2018-08-23 NOTE — Progress Notes (Signed)
Per Dr. Irene Limbo, okay to treat patient with Plt. of 74 & ANC of 1.2

## 2018-08-23 NOTE — Progress Notes (Signed)
Met with patient at registration to introduce myself as Arboriculturist and to offer available resources.   Discussed one-time $75 Engineer, drilling to assist with personal expenses while going through treatment. Also will discuss copay assistance if needed.  Gave patient my card if interested in applying and for any additional financial questions or concerns. He verbalized understanding.

## 2018-08-24 ENCOUNTER — Telehealth: Payer: Self-pay | Admitting: Hematology

## 2018-08-24 LAB — MULTIPLE MYELOMA PANEL, SERUM
Albumin SerPl Elph-Mcnc: 3.4 g/dL (ref 2.9–4.4)
Albumin/Glob SerPl: 0.8 (ref 0.7–1.7)
Alpha 1: 0.3 g/dL (ref 0.0–0.4)
Alpha2 Glob SerPl Elph-Mcnc: 0.9 g/dL (ref 0.4–1.0)
B-Globulin SerPl Elph-Mcnc: 3.1 g/dL — ABNORMAL HIGH (ref 0.7–1.3)
Gamma Glob SerPl Elph-Mcnc: 0.3 g/dL — ABNORMAL LOW (ref 0.4–1.8)
Globulin, Total: 4.6 g/dL — ABNORMAL HIGH (ref 2.2–3.9)
IgA: 2300 mg/dL — ABNORMAL HIGH (ref 61–437)
IgG (Immunoglobin G), Serum: 510 mg/dL — ABNORMAL LOW (ref 603–1613)
IgM (Immunoglobulin M), Srm: 22 mg/dL (ref 15–143)
M Protein SerPl Elph-Mcnc: 2 g/dL — ABNORMAL HIGH
Total Protein ELP: 8 g/dL (ref 6.0–8.5)

## 2018-08-24 LAB — KAPPA/LAMBDA LIGHT CHAINS
Kappa free light chain: 1145 mg/L — ABNORMAL HIGH (ref 3.3–19.4)
Kappa, lambda light chain ratio: 170.9 — ABNORMAL HIGH (ref 0.26–1.65)
Lambda free light chains: 6.7 mg/L (ref 5.7–26.3)

## 2018-08-24 NOTE — Telephone Encounter (Signed)
Scheduled appt per 6/1 los. 

## 2018-08-30 ENCOUNTER — Other Ambulatory Visit: Payer: Self-pay

## 2018-08-30 ENCOUNTER — Ambulatory Visit: Payer: Medicare Other | Admitting: Hematology

## 2018-08-30 ENCOUNTER — Inpatient Hospital Stay: Payer: Medicare Other

## 2018-08-30 ENCOUNTER — Other Ambulatory Visit: Payer: Medicare Other

## 2018-08-30 VITALS — BP 124/67 | HR 86 | Temp 98.3°F | Resp 17

## 2018-08-30 DIAGNOSIS — Z7189 Other specified counseling: Secondary | ICD-10-CM

## 2018-08-30 DIAGNOSIS — Z5112 Encounter for antineoplastic immunotherapy: Secondary | ICD-10-CM | POA: Diagnosis not present

## 2018-08-30 DIAGNOSIS — R300 Dysuria: Secondary | ICD-10-CM

## 2018-08-30 DIAGNOSIS — C9000 Multiple myeloma not having achieved remission: Secondary | ICD-10-CM

## 2018-08-30 LAB — CBC WITH DIFFERENTIAL/PLATELET
Abs Immature Granulocytes: 0 10*3/uL (ref 0.00–0.07)
Basophils Absolute: 0 10*3/uL (ref 0.0–0.1)
Basophils Relative: 0 %
Eosinophils Absolute: 0 10*3/uL (ref 0.0–0.5)
Eosinophils Relative: 1 %
HCT: 35.8 % — ABNORMAL LOW (ref 39.0–52.0)
Hemoglobin: 12.6 g/dL — ABNORMAL LOW (ref 13.0–17.0)
Immature Granulocytes: 0 %
Lymphocytes Relative: 29 %
Lymphs Abs: 0.7 10*3/uL (ref 0.7–4.0)
MCH: 34.3 pg — ABNORMAL HIGH (ref 26.0–34.0)
MCHC: 35.2 g/dL (ref 30.0–36.0)
MCV: 97.5 fL (ref 80.0–100.0)
Monocytes Absolute: 0.2 10*3/uL (ref 0.1–1.0)
Monocytes Relative: 8 %
Neutro Abs: 1.5 10*3/uL — ABNORMAL LOW (ref 1.7–7.7)
Neutrophils Relative %: 62 %
Platelets: 73 10*3/uL — ABNORMAL LOW (ref 150–400)
RBC: 3.67 MIL/uL — ABNORMAL LOW (ref 4.22–5.81)
RDW: 15.7 % — ABNORMAL HIGH (ref 11.5–15.5)
WBC: 2.4 10*3/uL — ABNORMAL LOW (ref 4.0–10.5)
nRBC: 0.9 % — ABNORMAL HIGH (ref 0.0–0.2)

## 2018-08-30 LAB — URINALYSIS, COMPLETE (UACMP) WITH MICROSCOPIC
Bilirubin Urine: NEGATIVE
Glucose, UA: NEGATIVE mg/dL
Hgb urine dipstick: NEGATIVE
Ketones, ur: NEGATIVE mg/dL
Leukocytes,Ua: NEGATIVE
Nitrite: NEGATIVE
Protein, ur: NEGATIVE mg/dL
Specific Gravity, Urine: 1.016 (ref 1.005–1.030)
pH: 5 (ref 5.0–8.0)

## 2018-08-30 LAB — CMP (CANCER CENTER ONLY)
ALT: 17 U/L (ref 0–44)
AST: 17 U/L (ref 15–41)
Albumin: 3.1 g/dL — ABNORMAL LOW (ref 3.5–5.0)
Alkaline Phosphatase: 46 U/L (ref 38–126)
Anion gap: 13 (ref 5–15)
BUN: 22 mg/dL (ref 8–23)
CO2: 23 mmol/L (ref 22–32)
Calcium: 9.3 mg/dL (ref 8.9–10.3)
Chloride: 99 mmol/L (ref 98–111)
Creatinine: 1.49 mg/dL — ABNORMAL HIGH (ref 0.61–1.24)
GFR, Est AFR Am: 48 mL/min — ABNORMAL LOW (ref >60)
GFR, Estimated: 41 mL/min — ABNORMAL LOW (ref >60)
Glucose, Bld: 104 mg/dL — ABNORMAL HIGH (ref 70–99)
Potassium: 4.6 mmol/L (ref 3.5–5.1)
Sodium: 135 mmol/L (ref 135–145)
Total Bilirubin: 0.7 mg/dL (ref 0.3–1.2)
Total Protein: 8.2 g/dL — ABNORMAL HIGH (ref 6.5–8.1)

## 2018-08-30 MED ORDER — DEXAMETHASONE 4 MG PO TABS
ORAL_TABLET | ORAL | Status: AC
  Filled 2018-08-30: qty 5

## 2018-08-30 MED ORDER — PROCHLORPERAZINE MALEATE 10 MG PO TABS
ORAL_TABLET | ORAL | Status: AC
  Filled 2018-08-30: qty 1

## 2018-08-30 MED ORDER — BORTEZOMIB CHEMO SQ INJECTION 3.5 MG (2.5MG/ML)
1.0000 mg/m2 | Freq: Once | INTRAMUSCULAR | Status: AC
  Administered 2018-08-30: 2 mg via SUBCUTANEOUS
  Filled 2018-08-30: qty 0.8

## 2018-08-30 MED ORDER — DEXAMETHASONE 4 MG PO TABS
20.0000 mg | ORAL_TABLET | Freq: Once | ORAL | Status: AC
  Administered 2018-08-30: 20 mg via ORAL

## 2018-08-30 MED ORDER — PROCHLORPERAZINE MALEATE 10 MG PO TABS
10.0000 mg | ORAL_TABLET | Freq: Once | ORAL | Status: AC
  Administered 2018-08-30: 10 mg via ORAL

## 2018-08-30 NOTE — Patient Instructions (Signed)
[facility] Discharge Instructions for Patients Receiving Chemotherapy  Today you received the following chemotherapy agents:  Velcade (bortezomib)  To help prevent nausea and vomiting after your treatment, we encourage you to take your nausea medication as prescribed.   If you develop nausea and vomiting that is not controlled by your nausea medication, call the clinic.   BELOW ARE SYMPTOMS THAT SHOULD BE REPORTED IMMEDIATELY:  *FEVER GREATER THAN 100.5 F  *CHILLS WITH OR WITHOUT FEVER  NAUSEA AND VOMITING THAT IS NOT CONTROLLED WITH YOUR NAUSEA MEDICATION  *UNUSUAL SHORTNESS OF BREATH  *UNUSUAL BRUISING OR BLEEDING  TENDERNESS IN MOUTH AND THROAT WITH OR WITHOUT PRESENCE OF ULCERS  *URINARY PROBLEMS  *BOWEL PROBLEMS  UNUSUAL RASH Items with * indicate a potential emergency and should be followed up as soon as possible.  Feel free to call the clinic should you have any questions or concerns. The clinic phone number is [phone].  Please show the CHEMO ALERT CARD at check-in to the Emergency Department and triage nurse.   

## 2018-08-30 NOTE — Progress Notes (Signed)
Per Dr Irene Limbo OK for Velcade today with PLT of 73

## 2018-08-31 LAB — URINE CULTURE: Culture: <10000 — AB

## 2018-09-06 ENCOUNTER — Other Ambulatory Visit: Payer: Self-pay | Admitting: Hematology

## 2018-09-06 ENCOUNTER — Other Ambulatory Visit: Payer: Self-pay

## 2018-09-06 ENCOUNTER — Inpatient Hospital Stay: Payer: Medicare Other

## 2018-09-06 ENCOUNTER — Telehealth: Payer: Self-pay | Admitting: *Deleted

## 2018-09-06 VITALS — BP 109/54 | HR 65 | Temp 98.7°F | Resp 16

## 2018-09-06 DIAGNOSIS — Z5112 Encounter for antineoplastic immunotherapy: Secondary | ICD-10-CM | POA: Diagnosis not present

## 2018-09-06 DIAGNOSIS — C9000 Multiple myeloma not having achieved remission: Secondary | ICD-10-CM

## 2018-09-06 DIAGNOSIS — Z7189 Other specified counseling: Secondary | ICD-10-CM

## 2018-09-06 LAB — CMP (CANCER CENTER ONLY)
ALT: 19 U/L (ref 0–44)
AST: 18 U/L (ref 15–41)
Albumin: 3 g/dL — ABNORMAL LOW (ref 3.5–5.0)
Alkaline Phosphatase: 42 U/L (ref 38–126)
Anion gap: 13 (ref 5–15)
BUN: 18 mg/dL (ref 8–23)
CO2: 22 mmol/L (ref 22–32)
Calcium: 9.3 mg/dL (ref 8.9–10.3)
Chloride: 102 mmol/L (ref 98–111)
Creatinine: 1.31 mg/dL — ABNORMAL HIGH (ref 0.61–1.24)
GFR, Est AFR Am: 56 mL/min — ABNORMAL LOW (ref >60)
GFR, Estimated: 48 mL/min — ABNORMAL LOW (ref >60)
Glucose, Bld: 83 mg/dL (ref 70–99)
Potassium: 4.3 mmol/L (ref 3.5–5.1)
Sodium: 137 mmol/L (ref 135–145)
Total Bilirubin: 0.8 mg/dL (ref 0.3–1.2)
Total Protein: 8.3 g/dL — ABNORMAL HIGH (ref 6.5–8.1)

## 2018-09-06 LAB — CBC WITH DIFFERENTIAL/PLATELET
Abs Immature Granulocytes: 0.01 10*3/uL (ref 0.00–0.07)
Basophils Absolute: 0 10*3/uL (ref 0.0–0.1)
Basophils Relative: 0 %
Eosinophils Absolute: 0 10*3/uL (ref 0.0–0.5)
Eosinophils Relative: 1 %
HCT: 37 % — ABNORMAL LOW (ref 39.0–52.0)
Hemoglobin: 12.6 g/dL — ABNORMAL LOW (ref 13.0–17.0)
Immature Granulocytes: 0 %
Lymphocytes Relative: 38 %
Lymphs Abs: 1 10*3/uL (ref 0.7–4.0)
MCH: 34 pg (ref 26.0–34.0)
MCHC: 34.1 g/dL (ref 30.0–36.0)
MCV: 99.7 fL (ref 80.0–100.0)
Monocytes Absolute: 0.3 10*3/uL (ref 0.1–1.0)
Monocytes Relative: 11 %
Neutro Abs: 1.3 10*3/uL — ABNORMAL LOW (ref 1.7–7.7)
Neutrophils Relative %: 50 %
Platelets: 92 10*3/uL — ABNORMAL LOW (ref 150–400)
RBC: 3.71 MIL/uL — ABNORMAL LOW (ref 4.22–5.81)
RDW: 16 % — ABNORMAL HIGH (ref 11.5–15.5)
WBC: 2.6 10*3/uL — ABNORMAL LOW (ref 4.0–10.5)
nRBC: 0 % (ref 0.0–0.2)

## 2018-09-06 MED ORDER — BORTEZOMIB CHEMO SQ INJECTION 3.5 MG (2.5MG/ML)
1.0000 mg/m2 | Freq: Once | INTRAMUSCULAR | Status: AC
  Administered 2018-09-06: 2 mg via SUBCUTANEOUS
  Filled 2018-09-06: qty 0.8

## 2018-09-06 MED ORDER — PROCHLORPERAZINE MALEATE 10 MG PO TABS
ORAL_TABLET | ORAL | Status: AC
  Filled 2018-09-06: qty 1

## 2018-09-06 MED ORDER — PROCHLORPERAZINE MALEATE 10 MG PO TABS
10.0000 mg | ORAL_TABLET | Freq: Once | ORAL | Status: AC
  Administered 2018-09-06: 10 mg via ORAL

## 2018-09-06 MED ORDER — DEXAMETHASONE 4 MG PO TABS
20.0000 mg | ORAL_TABLET | Freq: Once | ORAL | Status: AC
  Administered 2018-09-06: 11:00:00 20 mg via ORAL

## 2018-09-06 MED ORDER — DEXAMETHASONE 4 MG PO TABS
ORAL_TABLET | ORAL | Status: AC
  Filled 2018-09-06: qty 5

## 2018-09-06 NOTE — Telephone Encounter (Signed)
Returned patient's wife call - she had called stating concerns about husband and asked office for call back.  Per wife: patient is increasingly frail, has dementia, sleeps 18 hours a day, has no appetite, needs assistance with activities and ADLs. Has a rash on buttocks. Has fallen twice this month.  States last Tuesday, he had an episode lasting approx 5 minutes where he had difficulty with words - speech was garbled. She said she was driving and could not pull off. States garbled speech stopped and has not happened since.  Advised wife if patient ever experiences sudden change in speech or movement, she should not delay treatment, call 911 or go to nearest ED so that he can be evaluated.  Informed her that Dr. Irene Limbo would be notified of changes in patient and informed of her concerns. She is going to contact PCP and report recent changes as well.

## 2018-09-06 NOTE — Progress Notes (Signed)
Per Dr. Irene Limbo, okay for patient to receive treatment today with platelets 92 and ANC 1.3.

## 2018-09-06 NOTE — Patient Instructions (Signed)
[facility] Discharge Instructions for Patients Receiving Chemotherapy  Today you received the following chemotherapy agents:  Velcade (bortezomib)  To help prevent nausea and vomiting after your treatment, we encourage you to take your nausea medication as prescribed.   If you develop nausea and vomiting that is not controlled by your nausea medication, call the clinic.   BELOW ARE SYMPTOMS THAT SHOULD BE REPORTED IMMEDIATELY:  *FEVER GREATER THAN 100.5 F  *CHILLS WITH OR WITHOUT FEVER  NAUSEA AND VOMITING THAT IS NOT CONTROLLED WITH YOUR NAUSEA MEDICATION  *UNUSUAL SHORTNESS OF BREATH  *UNUSUAL BRUISING OR BLEEDING  TENDERNESS IN MOUTH AND THROAT WITH OR WITHOUT PRESENCE OF ULCERS  *URINARY PROBLEMS  *BOWEL PROBLEMS  UNUSUAL RASH Items with * indicate a potential emergency and should be followed up as soon as possible.  Feel free to call the clinic should you have any questions or concerns. The clinic phone number is [phone].  Please show the CHEMO ALERT CARD at check-in to the Emergency Department and triage nurse.   

## 2018-09-07 ENCOUNTER — Emergency Department (HOSPITAL_COMMUNITY): Payer: Medicare Other

## 2018-09-07 ENCOUNTER — Other Ambulatory Visit: Payer: Self-pay

## 2018-09-07 ENCOUNTER — Ambulatory Visit: Payer: Self-pay

## 2018-09-07 ENCOUNTER — Emergency Department (HOSPITAL_COMMUNITY)
Admission: EM | Admit: 2018-09-07 | Discharge: 2018-09-07 | Disposition: A | Discharge status: Home / Self Care | Payer: Medicare Other | Attending: Emergency Medicine | Admitting: Emergency Medicine

## 2018-09-07 ENCOUNTER — Encounter (HOSPITAL_COMMUNITY): Payer: Self-pay | Admitting: Emergency Medicine

## 2018-09-07 DIAGNOSIS — R4781 Slurred speech: Secondary | ICD-10-CM | POA: Diagnosis not present

## 2018-09-07 DIAGNOSIS — R21 Rash and other nonspecific skin eruption: Secondary | ICD-10-CM | POA: Diagnosis not present

## 2018-09-07 DIAGNOSIS — R5383 Other fatigue: Secondary | ICD-10-CM

## 2018-09-07 DIAGNOSIS — I110 Hypertensive heart disease with heart failure: Secondary | ICD-10-CM | POA: Diagnosis not present

## 2018-09-07 DIAGNOSIS — R339 Retention of urine, unspecified: Secondary | ICD-10-CM | POA: Diagnosis not present

## 2018-09-07 DIAGNOSIS — F0390 Unspecified dementia without behavioral disturbance: Secondary | ICD-10-CM | POA: Insufficient documentation

## 2018-09-07 DIAGNOSIS — Z7901 Long term (current) use of anticoagulants: Secondary | ICD-10-CM | POA: Diagnosis not present

## 2018-09-07 DIAGNOSIS — I509 Heart failure, unspecified: Secondary | ICD-10-CM | POA: Diagnosis not present

## 2018-09-07 DIAGNOSIS — Z79899 Other long term (current) drug therapy: Secondary | ICD-10-CM | POA: Insufficient documentation

## 2018-09-07 LAB — COMPREHENSIVE METABOLIC PANEL
ALT: 20 U/L (ref 0–44)
AST: 23 U/L (ref 15–41)
Albumin: 3.1 g/dL — ABNORMAL LOW (ref 3.5–5.0)
Alkaline Phosphatase: 36 U/L — ABNORMAL LOW (ref 38–126)
Anion gap: 15 (ref 5–15)
BUN: 31 mg/dL — ABNORMAL HIGH (ref 8–23)
CO2: 21 mmol/L — ABNORMAL LOW (ref 22–32)
Calcium: 9.3 mg/dL (ref 8.9–10.3)
Chloride: 101 mmol/L (ref 98–111)
Creatinine, Ser: 1.41 mg/dL — ABNORMAL HIGH (ref 0.61–1.24)
GFR calc Af Amer: 51 mL/min — ABNORMAL LOW (ref >60)
GFR calc non Af Amer: 44 mL/min — ABNORMAL LOW (ref >60)
Glucose, Bld: 82 mg/dL (ref 70–99)
Potassium: 4.3 mmol/L (ref 3.5–5.1)
Sodium: 137 mmol/L (ref 135–145)
Total Bilirubin: 0.8 mg/dL (ref 0.3–1.2)
Total Protein: 7.8 g/dL (ref 6.5–8.1)

## 2018-09-07 LAB — CBC WITH DIFFERENTIAL/PLATELET
Abs Immature Granulocytes: 0.05 10*3/uL (ref 0.00–0.07)
Basophils Absolute: 0 10*3/uL (ref 0.0–0.1)
Basophils Relative: 0 %
Eosinophils Absolute: 0 10*3/uL (ref 0.0–0.5)
Eosinophils Relative: 0 %
HCT: 36.2 % — ABNORMAL LOW (ref 39.0–52.0)
Hemoglobin: 12.2 g/dL — ABNORMAL LOW (ref 13.0–17.0)
Immature Granulocytes: 1 %
Lymphocytes Relative: 12 %
Lymphs Abs: 1.1 10*3/uL (ref 0.7–4.0)
MCH: 33.9 pg (ref 26.0–34.0)
MCHC: 33.7 g/dL (ref 30.0–36.0)
MCV: 100.6 fL — ABNORMAL HIGH (ref 80.0–100.0)
Monocytes Absolute: 0.2 10*3/uL (ref 0.1–1.0)
Monocytes Relative: 2 %
Neutro Abs: 8.3 10*3/uL — ABNORMAL HIGH (ref 1.7–7.7)
Neutrophils Relative %: 85 %
Platelets: 98 10*3/uL — ABNORMAL LOW (ref 150–400)
RBC: 3.6 MIL/uL — ABNORMAL LOW (ref 4.22–5.81)
RDW: 15.7 % — ABNORMAL HIGH (ref 11.5–15.5)
WBC: 9.7 10*3/uL (ref 4.0–10.5)
nRBC: 0 % (ref 0.0–0.2)

## 2018-09-07 LAB — URINALYSIS, ROUTINE W REFLEX MICROSCOPIC
Bilirubin Urine: NEGATIVE
Glucose, UA: NEGATIVE mg/dL
Hgb urine dipstick: NEGATIVE
Ketones, ur: NEGATIVE mg/dL
Leukocytes,Ua: NEGATIVE
Nitrite: NEGATIVE
Protein, ur: NEGATIVE mg/dL
Specific Gravity, Urine: 1.017 (ref 1.005–1.030)
pH: 5 (ref 5.0–8.0)

## 2018-09-07 LAB — TROPONIN I
Troponin I: 0.04 ng/mL (ref <0.03)
Troponin I: 0.04 ng/mL (ref <0.03)

## 2018-09-07 NOTE — ED Notes (Signed)
Bladder Scan Before Urination: 595 ML Bladder Scan After Urination: 485 ML

## 2018-09-07 NOTE — Telephone Encounter (Signed)
I spoke with Martin Conner.(DPR signed) for one wk difficult to get started to urinate and then voids very small amts. Pt has not urinated since 09/06/18 12 noon. Pt only drinking few sips of water or gatorade each day for several days. No abd pain and no dizziness. ? Dry mouth.This morning had normal BM for pt (sometimes constipation issues) with blood on tissue when wiped. No diarrhea. Pt is weak; weakness gradually worsening over last 1 -2 wks pts wife has to dress pt; when uses restroom pt is unable to get his pants down; wife does that which is relatively new for pt. On 08/31/18 pt words were garbled for approx 3 or so minutes and then that cleared. Pt has fallen twice in past 2 wks; the first fall pt complained with tailbone pain, ? Hip pain. Last week when fell again has complained of back pain. No covid symptoms,no travel and no known exposure to covid or flu. Martin Conner will take pt to [facility] ED since close to [place] [facility]. FYI to Dr Silvio Pate.

## 2018-09-07 NOTE — ED Notes (Signed)
Discharge paperwork reviewed with pt and wife.  Emphasis placed on following up with urologist ASAP.  Wife and pt verbalized understanding.  Pt will be wheeled to ED entrance via wheelchair, per wifes request.

## 2018-09-07 NOTE — ED Notes (Signed)
Date and time results received: 09/07/18  (use smartphrase ".now" to insert current time)  Test: Troponin Critical Value: 0.04  Name of Provider Notified: Flossie Dibble

## 2018-09-07 NOTE — ED Provider Notes (Signed)
[facility] DEPT Provider Note   CSN: [id number] Arrival date & time: 09/07/18  1134    History   Chief Complaint Chief Complaint  Patient presents with  . multiple complaints    HPI Martin Conner is a 85 y.o. male.     85 yo M with a chief complaints of fatigue.  This is been going on for the past 2 or 3 months.  Family feels he has been sleeping somewhere between 16 and 18 hours a day.  He also had an episode where he had slurring of his words and difficulty speaking for about 3 minutes about a week ago.  He has had about 3 falls in the past month as well.  No known head injury or loss of consciousness per the wife is unsure.  Denies abdominal pain vomiting chest pain shortness of breath cough or fever.  He had 1 bowel movement this morning that was described as grossly bloody.  Has had difficulty urinating for the past couple days.  States that the problem of getting the stream started.  Patient has no complaints currently.  He also has a rash to his buttocks that goes onto his legs.  Unsure of the duration.  The history is provided by the patient and the spouse.  Illness Severity:  Moderate Onset quality:  Gradual Duration:  3 months Timing:  Constant Progression:  Worsening Chronicity:  New Associated symptoms: fatigue and rash (to buttock and legs)   Associated symptoms: no abdominal pain, no chest pain, no congestion, no diarrhea, no fever, no headaches, no myalgias, no nausea, no shortness of breath and no vomiting     Past Medical History:  Diagnosis Date  . Fracture of femoral neck, right ([place]) 07/10/2015   s/p mechanical fall  . Hyperlipidemia   . Hypertensive heart disease   . Lumbar disc disease   . Osteoarthritis   . Permanent atrial fibrillation    a. 07/2012 Echo: EF 55-60%, no rwma, mildly dil RA/LA.  Marland Kitchen Syncope    a. 07/2012 - unclear etiology-->resulted in right tib/fib fx req ORIF/intramedullary nail of tibia.    Patient  Active Problem List   Diagnosis Date Noted  . Multiple myeloma ([place]) 07/19/2018  . Counseling regarding advance care planning and goals of care 07/19/2018  . MGUS (monoclonal gammopathy of unknown significance) 05/17/2018  . Dementia ([place]) 04/20/2018  . Memory loss 04/17/2017  . S/P total hip arthroplasty 07/30/2015  . Complete heart block ([place]) 05/13/2015  . Hypertensive heart disease   . Chronic atrial fibrillation   . Hyperlipidemia   . Advanced directives, counseling/discussion 02/03/2014  . Routine general medical examination at a health care facility 01/30/2012  . MITRAL REGURGITATION 06/05/2009  . Osteoarthrosis, generalized, involving multiple sites 01/20/2008  . DEGENERATIVE DISC DISEASE, LUMBAR SPINE 08/09/2007  . Essential hypertension, benign 05/26/2007    Past Surgical History:  Procedure Laterality Date  . ANKLE SURGERY     right ankle  . TIBIA IM NAIL INSERTION Right 07/21/2012   Procedure: INTRAMEDULLARY (IM) NAIL TIBIAL;  Surgeon: Johnny Bridge, MD;  Location: [facility];  Service: Orthopedics;  Laterality: Right;  . TOTAL HIP ARTHROPLASTY     left  . TOTAL HIP ARTHROPLASTY Right 07/11/2015   Procedure: TOTAL HIP ARTHROPLASTY;  Surgeon: Marchia Bond, MD;  Location: [facility];  Service: Orthopedics;  Laterality: Right;        Home Medications    Prior to Admission medications   Medication Sig Start  Date End Date Taking? Authorizing Provider  Acetaminophen (TYLENOL PO) Take 500 mg by mouth daily as needed (pain). Pt unsure of strength    Yes [provider]  acyclovir (ZOVIRAX) 400 MG tablet Take 1 tablet (400 mg total) by mouth 2 (two) times daily. 07/19/18  Yes Brunetta Genera, MD  losartan (COZAAR) 50 MG tablet TAKE 1 TABLET TWICE A DAY Patient taking differently: Take 50 mg by mouth 2 (two) times a day.  07/15/18  Yes Venia Carbon, MD  ondansetron (ZOFRAN) 8 MG tablet Take 1 tablet (8 mg total) by mouth 2 (two) times daily as needed (Nausea or  vomiting). 07/19/18  Yes Brunetta Genera, MD  sennosides-docusate sodium (SENOKOT-S) 8.6-50 MG tablet Take 2 tablets by mouth daily. Patient taking differently: Take 2 tablets by mouth as needed for constipation.  07/11/15  Yes Marchia Bond, MD  apixaban (ELIQUIS) 2.5 MG TABS tablet Take 1 tablet (2.5 mg total) by mouth 2 (two) times daily. Patient not taking: Reported on 09/07/2018 04/20/18   Venia Carbon, MD    Family History Family History  Problem Relation Age of Onset  . Cancer Mother   . Hyperlipidemia Sister   . Hypertension Sister     Social History Social History   Tobacco Use  . Smoking status: Never Smoker  . Smokeless tobacco: Never Used  Substance Use Topics  . Alcohol use: Yes    Alcohol/week: 12.0 standard drinks    Types: 12 Cans of beer per week    Comment: 2 beers several x/wk and occas cocktail on top of it.  . Drug use: No     Allergies   Patient has no known allergies.   Review of Systems Review of Systems  Constitutional: Positive for fatigue. Negative for chills and fever.  HENT: Negative for congestion and facial swelling.   Eyes: Negative for discharge and visual disturbance.  Respiratory: Negative for shortness of breath.   Cardiovascular: Negative for chest pain and palpitations.  Gastrointestinal: Negative for abdominal pain, diarrhea, nausea and vomiting.  Genitourinary: Positive for difficulty urinating.  Musculoskeletal: Positive for back pain (after falling now resolved). Negative for arthralgias and myalgias.  Skin: Positive for rash (to buttock and legs). Negative for color change.  Neurological: Negative for tremors, syncope and headaches.  Psychiatric/Behavioral: Negative for confusion and dysphoric mood.     Physical Exam Updated Vital Signs BP (!) 184/105   Pulse 82   Temp 98.6 F (37 C) (Oral)   Resp 16   Ht 5' 9"  (1.753 m)   Wt 80.7 kg   SpO2 99%   BMI 26.29 kg/m   Physical Exam Vitals signs and nursing  note reviewed.  Constitutional:      Appearance: He is well-developed.  HENT:     Head: Normocephalic and atraumatic.  Eyes:     Pupils: Pupils are equal, round, and reactive to light.  Neck:     Musculoskeletal: Normal range of motion and neck supple.     Vascular: No JVD.  Cardiovascular:     Rate and Rhythm: Normal rate and regular rhythm.     Heart sounds: No murmur. No friction rub. No gallop.   Pulmonary:     Effort: No respiratory distress.     Breath sounds: No wheezing.  Abdominal:     General: There is no distension.     Tenderness: There is no guarding or rebound.  Genitourinary:    Comments: No hemorrhoids soft brown stool in  the vault. Musculoskeletal: Normal range of motion.  Skin:    Coloration: Skin is not pale.     Findings: Rash present.     Comments: Papular rash noted to the buttocks.  Seems to centralized on the perirectal area.  No warmth no fluctuance no tenderness.  Neurological:     Mental Status: He is alert and oriented to person, place, and time.  Psychiatric:        Behavior: Behavior normal.      ED Treatments / Results  Labs (all labs ordered are listed, but only abnormal results are displayed) Labs Reviewed  COMPREHENSIVE METABOLIC PANEL - Abnormal; Notable for the following components:      Result Value   CO2 21 (*)    BUN 31 (*)    Creatinine, Ser 1.41 (*)    Albumin 3.1 (*)    Alkaline Phosphatase 36 (*)    GFR calc non Af Amer 44 (*)    GFR calc Af Amer 51 (*)    All other components within normal limits  CBC WITH DIFFERENTIAL/PLATELET - Abnormal; Notable for the following components:   RBC 3.60 (*)    Hemoglobin 12.2 (*)    HCT 36.2 (*)    MCV 100.6 (*)    RDW 15.7 (*)    Platelets 98 (*)    Neutro Abs 8.3 (*)    All other components within normal limits  TROPONIN I - Abnormal; Notable for the following components:   Troponin I 0.04 (*)    All other components within normal limits  TROPONIN I - Abnormal; Notable for the  following components:   Troponin I 0.04 (*)    All other components within normal limits  URINALYSIS, ROUTINE W REFLEX MICROSCOPIC    EKG EKG Interpretation  Date/Time:  Tuesday September 07 2018 14:14:05 EDT Ventricular Rate:  75 PR Interval:    QRS Duration: 106 QT Interval:  572 QTC Calculation: 640 R Axis:   16 Text Interpretation:  Atrial fibrillation Abnormal R-wave progression, early transition Abnrm T, consider ischemia, anterolateral lds Prolonged QT interval Baseline wander in lead(s) II III aVF V5 No significant change since last tracing Confirmed by Deno Etienne [phone]) on 09/07/2018 3:37:41 PM   Radiology Dg Pelvis 1-2 Views  Result Date: 09/07/2018 CLINICAL DATA:  Status post fall x2 over the past 3 weeks, most recently 09/02/2018. Pain. Initial encounter. EXAM: PELVIS - 1-2 VIEW COMPARISON:  None. FINDINGS: No acute bony or joint abnormality is identified. Bilateral total hip arthroplasties are in place. No hardware complication is seen. IMPRESSION: No acute abnormality. Electronically Signed   By: Inge Rise M.D.   On: 09/07/2018 15:14   Ct Head Wo Contrast  Result Date: 09/07/2018 CLINICAL DATA:  Status post fall x2 over the past 3 weeks, most recently 09/02/2018. Initial encounter. EXAM: CT HEAD WITHOUT CONTRAST TECHNIQUE: Contiguous axial images were obtained from the base of the skull through the vertex without intravenous contrast. COMPARISON:  Brain MRI 04/29/2018. FINDINGS: Brain: No evidence of acute infarction, hemorrhage, hydrocephalus, extra-axial collection or mass lesion/mass effect. Atrophy, chronic microvascular ischemic change and remote right MCA and PCA infarcts are unchanged in appearance. Vascular: Atherosclerosis noted. Skull: No fracture or focal lesion. Sinuses/Orbits: Negative. Other: None. IMPRESSION: No acute abnormality. Atrophy, chronic microvascular ischemic change and remote right MCA and PCA territory infarcts. Electronically Signed   By: Inge Rise M.D.   On: 09/07/2018 15:12    Procedures Procedures (including critical care time)  Medications Ordered  in ED Medications - No data to display   Initial Impression / Assessment and Plan / ED Course  I have reviewed the triage vital signs and the nursing notes.  Pertinent labs & imaging results that were available during my care of the patient were reviewed by me and considered in my medical decision making (see chart for details).        85 yo M with a chief complaint of generalized fatigue.  The patient does have a petechial type rash to his buttock.  No known history of ticks being embedded, will obtain a urine CT of the head and lab work EKG reassess.  Trop elevated, but mildly so.  Repeat stable, likely chronic.  UA negative for infection, mild renal dysfunction.  The patient has urinary retention clinically his postvoid residual was just about 500.  I discussed risks and benefits of Foley catheter placement and the family is somewhat concerned about leaving 1 and and so they elected for a in and out cath which resulted about 600 cc removed.  We will have them follow-up with urologist as an outpatient.  PCP follow-up.  9:53 PM:  I have discussed the diagnosis/risks/treatment options with the patient and family and believe the pt to be eligible for discharge home to follow-up with PCP, urology. We also discussed returning to the ED immediately if new or worsening sx occur. We discussed the sx which are most concerning (e.g., sudden worsening pain, fever, inability to tolerate by mouth) that necessitate immediate return. Medications administered to the patient during their visit and any new prescriptions provided to the patient are listed below.  Medications given during this visit Medications - No data to display   The patient appears reasonably screen and/or stabilized for discharge and I doubt any other medical condition or other [facility] requiring further screening,  evaluation, or treatment in the ED at this time prior to discharge.    Final Clinical Impressions(s) / ED Diagnoses   Final diagnoses:  Urinary retention  Fatigue, unspecified type    ED Discharge Orders    None       Deno Etienne, DO 09/07/18 2154

## 2018-09-07 NOTE — ED Triage Notes (Signed)
Pt wife has list with patient's complaints: Cant pee 2-3 days having hard time going and very little output Blood in stool-this morning Poor appetite-past month Sleeps 16-18 hours a day-month or two  Needs help getting up, dressed and going to restroom-6-8 weeks [place] twice in last 3 weeks, constipated with pain in tail bone and back pain.  On June 9th episode with speech last Tuesday-words would not come out and were gargled. Butt/back side shows rash Dr Kale-oncologist Dr Carollee Massed PCP was told to come to Ed for evaluation Last cancer treatment on 09/05/20. Multiple myeloma

## 2018-09-07 NOTE — ED Notes (Signed)
Family at bedside. 

## 2018-09-07 NOTE — Telephone Encounter (Signed)
Is in the ER now Will await the evaluation there

## 2018-09-07 NOTE — ED Notes (Signed)
Pt denies any complaints at this time

## 2018-09-07 NOTE — Telephone Encounter (Signed)
Patient's wife called and says for the past 2 days, he has not been able to urinate. I asked no urine at all, she says yes he's urinated, it just takes him several tries before he can go. No pain when he urinates, he just goes often and can't go every time. Other symptoms she says she saw blood in his stool this morning, over the last couple of weeks he has fallen twice, she says last week his speech was garbled then it cleared up, she says he's weak and she has to do everything for him. She says he finished up his 6th cancer treatment yesterday and she was told to call to let Dr. Silvio Pate know of all what is going on with him. I called the office and spoke to Ozzie Hoyle, LPN who asked to speak to his wife, the call was connected successfully.  Answer Assessment - Initial Assessment Questions 1. SYMPTOM: "What's the main symptom you're concerned about?" (e.g., frequency, incontinence)     Frequency, unable to urinate 2. ONSET: "When did the symptom start?"     2 days ago 3. PAIN: "Is there any pain?" If so, ask: "How bad is it?" (Scale: 1-10; mild, moderate, severe)     No 4. CAUSE: "What do you think is causing the symptoms?"     I don't know 5. OTHER SYMPTOMS: "Do you have any other symptoms?" (e.g., fever, flank pain, blood in urine, pain with urination)     Blood in stool today, weakness, rash on buttocks 6. PREGNANCY: "Is there any chance you are pregnant?" "When was your last menstrual period?"     N/A  Protocols used: URINARY [facility]

## 2018-09-07 NOTE — Discharge Instructions (Signed)
Please return for worsening symptoms.  You had a abnormal amount of urine in your bladder.  Typically we would place a Foley catheter and have you follow-up.  You elected to not go with the Foley catheter, this could result in an acute issue or you would have to return in the middle the night or could cause you to have some worsening renal function.  Please follow-up with urologist in the office.

## 2018-09-08 ENCOUNTER — Telehealth: Payer: Self-pay | Admitting: Internal Medicine

## 2018-09-08 NOTE — Telephone Encounter (Signed)
Spoke to wife per DPR.

## 2018-09-08 NOTE — Telephone Encounter (Signed)
See other notes

## 2018-09-08 NOTE — Telephone Encounter (Signed)
    Okay to wait the 2-3 weeks as long as he is emptying his bladder   Previous Messages  ----- Message -----  From: Pilar Grammes, CMA  Sent: 09/08/2018 10:25 AM EDT  To: Venia Carbon, MD   Spoke to pt's wife. She said since they did the catheter last night and drained the bladder he was able to void on his own this morning. She said [facility] in [place] told her it would be 2-3 weeks before he could get an appt.  ----- Message -----  From: Venia Carbon, MD  Sent: 09/08/2018  7:55 AM EDT  To: Pilar Grammes, CMA, Diamond Nickel, RN   He was in the ER with urinary retention last night.  Needs urgent urology evaluation---see if they are getting an appt   Rich

## 2018-09-09 NOTE — Progress Notes (Signed)
HEMATOLOGY/ONCOLOGY CLINIC NOTE  Date of Service: 09/13/2018  Patient Care Team: Venia Carbon, MD as PCP - Bethena Roys, MD as Consulting Physician (Cardiology) Marchia Bond, MD as Consulting Physician (Orthopedic Surgery)  CHIEF COMPLAINTS/PURPOSE OF CONSULTATION:  Multiple Myeloma  HISTORY OF PRESENTING ILLNESS:   Martin Conner is a wonderful 85 y.o. male who has been referred to [place] by Dr. Nicholas Lose for evaluation and management of his newly diagnosed Multiple Myeloma. The pt reports that he is doing well overall.   The pt reports that he has not felt differently recently as compared to his baseline 6-12 months ago. The pt notes that he had a physical in late January 2020 with his PCP and routine labs which revealed a concern for high protein. This was evaluated further and the pt was referred to my colleague Dr. Lindi Adie who has completed initial work up.  The pt denies problems passing urine, bowel abnormalities, or neuropathy. He also denies back pains.  The pt notes that he walks very little, ambulates with a cane or a walker, but can get around in his home well. He lives with his wife in his own home. The pt denies having falls, and uses a cane/walker to prevent falls. The pt notes that he has two children, and notes that they are not very involved with his medical decision making.  The pt denies heart, liver, lung, thyroid problems. He does have Afib however, and takes Eliquis.  Most recent lab results (06/03/18) of CBC w/diff is as follows: all values are WNL except for WBC at 3.8k, RBC at 3.73, HGB at 12.5, HCT at 37.4, MCV at 100.3, PLT at 145k. 06/03/18 MMP revealed all values WNL except for IgA at 2646, Total Protein at 8.7, B-globulin at 3.6, M Protein at 2.6g, and Globulin Total at 5.2. 06/03/18 Beta-2 microglobulin at 3.1 06/03/18 SFLC revealed Kappa at 1390.2, and K:L ratio at 105.32  On review of systems, pt reports stable energy levels, and denies  urination abnormalities, back pain, changes in bowel habits, abdominal pains, and any other symptoms.   On PMHx the pt reports Afib. On Social Hx the pt reports infrequent single alcoholic beverage. Denies ever smoking cigarettes. 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 On Family Hx the pt denies significant concerns.  Interval History:    Martin Conner returns today for management and evaluation of his Multiple Myeloma. The patient's last visit with [place] was on 08/23/18. The pt reports that he is doing well overall. He is accompanied today by his wife and daughter via Social worker.  The pt reports that he is not currently having any lower abdominal pains. He presented to the ED a week ago and last night for urinary retention. He will be seeing Urology next week on 09/23/18. The pt's wife notes that the pt isn't eating very well at all. He notes that his appetite is not as good as it used to be. The pt's wife notes that he isn't functioning well at home, and the pt's wife notes that she does not feel that she is able to physically care for the pt as much as he needs.  Lab results today (09/10/18) of CBC w/diff and CMP is as follows: all values are WNL except for RBC at 3.52, HGB at 12.0, HCT at 34.6, MCH at 34.1, RDW at 15.9, PLT at 88k, nRBC at 0.4%, Creatinine at 1.40, Total Protein at 8.2, Albumin at 3.0, GFR  at 51. 09/10/18 MMP is pending  On review of systems, pt reports eating less, lower appetite, urinary retention,  and denies abdominal pains, concerns for infections, and any other symptoms.    MEDICAL HISTORY:  Past Medical History:  Diagnosis Date  . Fracture of femoral neck, right ([place]) 07/10/2015   s/p mechanical fall  . Hyperlipidemia   . Hypertensive heart disease   . Lumbar disc disease   . Osteoarthritis   . Permanent atrial fibrillation    a. 07/2012 Echo: EF 55-60%, no rwma, mildly dil RA/LA.  Marland Kitchen Syncope    a. 07/2012 - unclear etiology-->resulted in right tib/fib fx req  ORIF/intramedullary nail of tibia.    SURGICAL HISTORY: Past Surgical History:  Procedure Laterality Date  . ANKLE SURGERY     right ankle  . TIBIA IM NAIL INSERTION Right 07/21/2012   Procedure: INTRAMEDULLARY (IM) NAIL TIBIAL;  Surgeon: Johnny Bridge, MD;  Location: [facility];  Service: Orthopedics;  Laterality: Right;  . TOTAL HIP ARTHROPLASTY     left  . TOTAL HIP ARTHROPLASTY Right 07/11/2015   Procedure: TOTAL HIP ARTHROPLASTY;  Surgeon: Marchia Bond, MD;  Location: [facility];  Service: Orthopedics;  Laterality: Right;    SOCIAL HISTORY: Social History   Socioeconomic History  . Marital status: Married    Spouse name: Not on file  . Number of children: 2  . Years of education: Not on file  . Highest education level: Not on file  Occupational History  . Occupation: retired     Fish farm manager: [employer]  . Financial resource strain: Not on file  . Food insecurity    Worry: Not on file    Inability: Not on file  . Transportation needs    Medical: Not on file    Non-medical: Not on file  Tobacco Use  . Smoking status: Never Smoker  . Smokeless tobacco: Never Used  Substance and Sexual Activity  . Alcohol use: Yes    Alcohol/week: 12.0 standard drinks    Types: 12 Cans of beer per week    Comment: 2 beers several x/wk and occas cocktail on top of it.  . Drug use: No  . Sexual activity: Never  Lifestyle  . Physical activity    Days per week: Not on file    Minutes per session: Not on file  . Stress: Not on file  Relationships  . Social Herbalist on phone: Not on file    Gets together: Not on file    Attends religious service: Not on file    Active member of club or organization: Not on file    Attends meetings of clubs or organizations: Not on file    Relationship status: Not on file  . Intimate partner violence    Fear of current or ex partner: Not on file    Emotionally abused: Not on file    Physically abused: Not on file    Forced sexual  activity: Not on file  Other Topics Concern  . Not on file  Social History Narrative   Not sure about living will   Requests that wife make decisions for him if needed   Would accept resuscitation but no prolonged artificial ventilation   Would not want tube feeds if cognitively unaware    FAMILY HISTORY: Family History  Problem Relation Age of Onset  . Cancer Mother   . Hyperlipidemia Sister   . Hypertension Sister  ALLERGIES:  has No Known Allergies.  MEDICATIONS:  Current Outpatient Medications  Medication Sig Dispense Refill  . Acetaminophen (TYLENOL PO) Take 500 mg by mouth daily as needed (pain). Pt unsure of strength     . acyclovir (ZOVIRAX) 400 MG tablet Take 1 tablet (400 mg total) by mouth 2 (two) times daily. 60 tablet 3  . apixaban (ELIQUIS) 2.5 MG TABS tablet Take 1 tablet (2.5 mg total) by mouth 2 (two) times daily. (Patient not taking: Reported on 09/07/2018) 180 tablet 3  . losartan (COZAAR) 50 MG tablet TAKE 1 TABLET TWICE A DAY (Patient taking differently: Take 50 mg by mouth 2 (two) times a day. ) 180 tablet 3  . ondansetron (ZOFRAN) 8 MG tablet Take 1 tablet (8 mg total) by mouth 2 (two) times daily as needed (Nausea or vomiting). 30 tablet 1  . sennosides-docusate sodium (SENOKOT-S) 8.6-50 MG tablet Take 2 tablets by mouth daily. (Patient taking differently: Take 2 tablets by mouth as needed for constipation. ) 30 tablet 1  . tamsulosin (FLOMAX) 0.4 MG CAPS capsule Take 1 capsule (0.4 mg total) by mouth daily after supper. 30 capsule 0   No current facility-administered medications for this visit.     REVIEW OF SYSTEMS:    A 10+ POINT REVIEW OF SYSTEMS WAS OBTAINED including neurology, dermatology, psychiatry, cardiac, respiratory, lymph, extremities, GI, GU, Musculoskeletal, constitutional, breasts, reproductive, HEENT.  All pertinent positives are noted in the HPI.  All others are negative.   PHYSICAL EXAMINATION: ECOG PERFORMANCE STATUS: 3 -  Symptomatic, >50% confined to bed  Vitals:   09/13/18 0919  BP: 127/86  Pulse: 100  Resp: 18  Temp: 99 F (37.2 C)  SpO2: 97%   Filed Weights   09/13/18 0919  Weight: 168 lb 4.8 oz (76.3 kg)   .Body mass index is 24.85 kg/m.  GENERAL:alert, in no acute distress and comfortable SKIN: no acute rashes, no significant lesions EYES: conjunctiva are pink and non-injected, sclera anicteric OROPHARYNX: MMM, no exudates, no oropharyngeal erythema or ulceration NECK: supple, no JVD LYMPH:  no palpable lymphadenopathy in the cervical, axillary or inguinal regions LUNGS: clear to auscultation b/l with normal respiratory effort HEART: regular rate & rhythm ABDOMEN:  normoactive bowel sounds , non tender, not distended. No palpable hepatosplenomegaly.  Extremity: no pedal edema PSYCH: alert & oriented x 3 with fluent speech NEURO: no focal motor/sensory deficits   LABORATORY DATA:  I have reviewed the data as listed  . CBC Latest Ref Rng & Units 09/13/2018 09/07/2018 09/06/2018  WBC 4.0 - 10.5 K/uL 5.0 9.7 2.6(L)  Hemoglobin 13.0 - 17.0 g/dL 12.0(L) 12.2(L) 12.6(L)  Hematocrit 39.0 - 52.0 % 34.6(L) 36.2(L) 37.0(L)  Platelets 150 - 400 K/uL 88(L) 98(L) 92(L)  ANC1.5k  . CMP Latest Ref Rng & Units 09/13/2018 09/12/2018 09/07/2018  Glucose 70 - 99 mg/dL 92 104(H) 82  BUN 8 - 23 mg/dL 22 22 31(H)  Creatinine 0.61 - 1.24 mg/dL 1.40(H) 1.28(H) 1.41(H)  Sodium 135 - 145 mmol/L 136 135 137  Potassium 3.5 - 5.1 mmol/L 4.5 4.5 4.3  Chloride 98 - 111 mmol/L 99 98 101  CO2 22 - 32 mmol/L 24 20(L) 21(L)  Calcium 8.9 - 10.3 mg/dL 9.4 9.3 9.3  Total Protein 6.5 - 8.1 g/dL 8.2(H) - 7.8  Total Bilirubin 0.3 - 1.2 mg/dL 0.9 - 0.8  Alkaline Phos 38 - 126 U/L 46 - 36(L)  AST 15 - 41 U/L 19 - 23  ALT 0 - 44 U/L  17 - 20    06/03/18 BM Bx:    06/03/18 Cytogenetics:      RADIOGRAPHIC STUDIES: I have personally reviewed the radiological images as listed and agreed with the findings in the  report. Dg Pelvis 1-2 Views  Result Date: 09/07/2018 CLINICAL DATA:  Status post fall x2 over the past 3 weeks, most recently 09/02/2018. Pain. Initial encounter. EXAM: PELVIS - 1-2 VIEW COMPARISON:  None. FINDINGS: No acute bony or joint abnormality is identified. Bilateral total hip arthroplasties are in place. No hardware complication is seen. IMPRESSION: No acute abnormality. Electronically Signed   By: Inge Rise M.D.   On: 09/07/2018 15:14   Ct Head Wo Contrast  Result Date: 09/07/2018 CLINICAL DATA:  Status post fall x2 over the past 3 weeks, most recently 09/02/2018. Initial encounter. EXAM: CT HEAD WITHOUT CONTRAST TECHNIQUE: Contiguous axial images were obtained from the base of the skull through the vertex without intravenous contrast. COMPARISON:  Brain MRI 04/29/2018. FINDINGS: Brain: No evidence of acute infarction, hemorrhage, hydrocephalus, extra-axial collection or mass lesion/mass effect. Atrophy, chronic microvascular ischemic change and remote right MCA and PCA infarcts are unchanged in appearance. Vascular: Atherosclerosis noted. Skull: No fracture or focal lesion. Sinuses/Orbits: Negative. Other: None. IMPRESSION: No acute abnormality. Atrophy, chronic microvascular ischemic change and remote right MCA and PCA territory infarcts. Electronically Signed   By: Inge Rise M.D.   On: 09/07/2018 15:12    ASSESSMENT & PLAN:  85 y.o. male with  1. Multiple Myeloma 06/03/18 BM Biopsy revealed cellular marrow involved by plasma cell neoplasm (about 50%) 06/03/18 Cytogenetics revealed 54.5% of cells showed gain of ATM, and 51.5% of cells with loss of p53 06/03/18 Bone Survey revealed Possible 8 mm lytic lesion versus subarachnoid granulation within the parietal convexity of the calvarium. No other suspicious lytic lesions seen within the skeleton. Labs upon initial presentation from 06/03/18, M Protein at 2.6g with IgA kappa specificity. Some new mild anemia with HGB at 12.5.   06/30/18 M Protein at 3.1g  07/20/18 PET/CT revealed "No hypermetabolic bone disease evident. 2. 12 mm hypermetabolic right thyroid nodule, nonspecific. Thyroid ultrasound could be used to further evaluate as clinically warranted in this individual. No other unexpected hypermetabolic soft tissue disease identified on today's study. 3. Bilateral tiny pulmonary nodules measuring up to about 3 mm maximum size. Consider follow-up to ensure stability. 4.  Aortic Atherosclerosis."  PLAN: -Discussed pt labwork today, 09/10/18; WBC normalized to 5.0k. Other blood counts and chemistries are stable. -09/13/18 MMP is pending. Last available 08/23/18 M Protein improved to 2.0g -Discussed patient's goals of care and my concern that the patient's functional status appears to be declining. In light of this, initiated discussion again about the patient's goals of care at this time. Discussed again that the goal of treatment is palliative and is not curative in nature. The pt prefers to discuss his goals further with his wife and daughter in the next 2-3 weeks. -Will hold treatment for the next two weeks, including today's planned C3D8 dose reduced Velcade and Dexamethasone. Will tentatively resume treatment in 3 weeks if consistent with pt's goals of care. -Will have in home nursing assessment and referral to home health -Will discuss goals of care and decision on treatment course at next visit in 2-3 weeks -Follow up with Urology on 09/23/18 -Recommend changing seated position periodically and using Vitamin A & D ointment -Advised walking with cane consistently to ambulate -Discussed balance between disease control and preserving quality of life when considering  adding further medications -Will hold on bone directed therapies at this time given absence of bone tumors -Discussed again that the pt has a 17p mutation, which can be a predictor of treatment resistance and a possibly more aggressive course. -Continue Acyclovir  BID -Advised infection prevention strategies, avoiding public spaces, and frequent hand washing -Will see the pt back in 3 weeks   -Referral to advanced home care for home safety evaluation -Cancel all cancer center appointments for 2 weeks and schedule next Velcade dose, labs and MD visit in 3 weeks   I discussed the assessment and treatment plan with the patient. The patient was provided an opportunity to ask questions and all were answered. The patient agreed with the plan and demonstrated an understanding of the instructions.    The total time spent in the appt was 30 minutes and more than 50% was on counseling and direct patient cares.    Sullivan Lone MD MS AAHIVMS [facility] Hematology/Oncology Physician [facility]  (Office):       [phone] (Work cell):  [phone] (Fax):           [phone]  09/13/2018 10:35 AM  I, Baldwin Jamaica, am acting as a scribe for Dr. Sullivan Lone.   .I have reviewed the above documentation for accuracy and completeness, and I agree with the above. Brunetta Genera MD

## 2018-09-12 ENCOUNTER — Emergency Department (HOSPITAL_COMMUNITY)
Admission: EM | Admit: 2018-09-12 | Discharge: 2018-09-12 | Disposition: A | Discharge status: Home / Self Care | Payer: Medicare Other | Attending: Emergency Medicine | Admitting: Emergency Medicine

## 2018-09-12 ENCOUNTER — Other Ambulatory Visit: Payer: Self-pay

## 2018-09-12 ENCOUNTER — Encounter (HOSPITAL_COMMUNITY): Payer: Self-pay | Admitting: Emergency Medicine

## 2018-09-12 DIAGNOSIS — I10 Essential (primary) hypertension: Secondary | ICD-10-CM | POA: Insufficient documentation

## 2018-09-12 DIAGNOSIS — R339 Retention of urine, unspecified: Secondary | ICD-10-CM | POA: Diagnosis present

## 2018-09-12 DIAGNOSIS — Z79899 Other long term (current) drug therapy: Secondary | ICD-10-CM | POA: Diagnosis not present

## 2018-09-12 DIAGNOSIS — R1030 Lower abdominal pain, unspecified: Secondary | ICD-10-CM | POA: Insufficient documentation

## 2018-09-12 DIAGNOSIS — Z8579 Personal history of other malignant neoplasms of lymphoid, hematopoietic and related tissues: Secondary | ICD-10-CM | POA: Insufficient documentation

## 2018-09-12 DIAGNOSIS — Z96643 Presence of artificial hip joint, bilateral: Secondary | ICD-10-CM | POA: Diagnosis not present

## 2018-09-12 DIAGNOSIS — F0390 Unspecified dementia without behavioral disturbance: Secondary | ICD-10-CM | POA: Diagnosis not present

## 2018-09-12 LAB — BASIC METABOLIC PANEL
Anion gap: 17 — ABNORMAL HIGH (ref 5–15)
BUN: 22 mg/dL (ref 8–23)
CO2: 20 mmol/L — ABNORMAL LOW (ref 22–32)
Calcium: 9.3 mg/dL (ref 8.9–10.3)
Chloride: 98 mmol/L (ref 98–111)
Creatinine, Ser: 1.28 mg/dL — ABNORMAL HIGH (ref 0.61–1.24)
GFR calc Af Amer: 58 mL/min — ABNORMAL LOW (ref >60)
GFR calc non Af Amer: 50 mL/min — ABNORMAL LOW (ref >60)
Glucose, Bld: 104 mg/dL — ABNORMAL HIGH (ref 70–99)
Potassium: 4.5 mmol/L (ref 3.5–5.1)
Sodium: 135 mmol/L (ref 135–145)

## 2018-09-12 LAB — URINALYSIS, ROUTINE W REFLEX MICROSCOPIC
Bacteria, UA: NONE SEEN
Bilirubin Urine: NEGATIVE
Glucose, UA: NEGATIVE mg/dL
Ketones, ur: NEGATIVE mg/dL
Leukocytes,Ua: NEGATIVE
Nitrite: NEGATIVE
Protein, ur: NEGATIVE mg/dL
Specific Gravity, Urine: 1.016 (ref 1.005–1.030)
pH: 5 (ref 5.0–8.0)

## 2018-09-12 MED ORDER — TAMSULOSIN HCL 0.4 MG PO CAPS: 0.4000 mg | ORAL_CAPSULE | Freq: Every day | ORAL | 0 refills | Status: DC

## 2018-09-12 MED ORDER — ACETAMINOPHEN 325 MG PO TABS
650.0000 mg | ORAL_TABLET | Freq: Once | ORAL | Status: AC
  Administered 2018-09-12: 650 mg via ORAL
  Filled 2018-09-12: qty 2

## 2018-09-12 MED ORDER — OXYBUTYNIN CHLORIDE ER 5 MG PO TB24
5.0000 mg | ORAL_TABLET | Freq: Every day | ORAL | Status: DC
  Administered 2018-09-12: 5 mg via ORAL
  Filled 2018-09-12: qty 1

## 2018-09-12 MED ORDER — LIDOCAINE HCL URETHRAL/MUCOSAL 2 % EX GEL
1.0000 "application " | Freq: Once | CUTANEOUS | Status: AC | PRN
  Administered 2018-09-12: 1 via URETHRAL
  Filled 2018-09-12: qty 30

## 2018-09-12 NOTE — ED Notes (Signed)
Bed: WLPT3 Expected date:  Expected time:  Means of arrival:  Comments: 

## 2018-09-12 NOTE — ED Provider Notes (Addendum)
[facility] DEPT Provider Note   CSN: [id number] Arrival date & time: 09/12/18  1955    History   Chief Complaint Chief Complaint  Patient presents with  . Urinary Retention    HPI Martin Conner is a 85 y.o. male.     HPI Patient presents emergency room for evaluation of urinary retention.  Patient states he was seen in the emergency room on Wednesday for similar issues.  He had difficulty urinating.  They did an in and out catheter and noted he had 600 cc of urine.  Options were discussed with family and they decided to not proceed with a Foley catheter.  Patient's stated for the next couple of days he had been able to urinate.  However, since yesterday he has not been able to urinate properly.  He felt like his bladder was becoming more distended and he had discomfort in his suprapubic region.  Patient denies any fevers or chills.  No vomiting or diarrhea. Past Medical History:  Diagnosis Date  . Fracture of femoral neck, right ([place]) 07/10/2015   s/p mechanical fall  . Hyperlipidemia   . Hypertensive heart disease   . Lumbar disc disease   . Osteoarthritis   . Permanent atrial fibrillation    a. 07/2012 Echo: EF 55-60%, no rwma, mildly dil RA/LA.  Marland Kitchen Syncope    a. 07/2012 - unclear etiology-->resulted in right tib/fib fx req ORIF/intramedullary nail of tibia.    Patient Active Problem List   Diagnosis Date Noted  . Multiple myeloma ([place]) 07/19/2018  . Counseling regarding advance care planning and goals of care 07/19/2018  . MGUS (monoclonal gammopathy of unknown significance) 05/17/2018  . Dementia ([place]) 04/20/2018  . Memory loss 04/17/2017  . S/P total hip arthroplasty 07/30/2015  . Complete heart block ([place]) 05/13/2015  . Hypertensive heart disease   . Chronic atrial fibrillation   . Hyperlipidemia   . Advanced directives, counseling/discussion 02/03/2014  . Routine general medical examination at a health care facility 01/30/2012  .  MITRAL REGURGITATION 06/05/2009  . Osteoarthrosis, generalized, involving multiple sites 01/20/2008  . DEGENERATIVE DISC DISEASE, LUMBAR SPINE 08/09/2007  . Essential hypertension, benign 05/26/2007    Past Surgical History:  Procedure Laterality Date  . ANKLE SURGERY     right ankle  . TIBIA IM NAIL INSERTION Right 07/21/2012   Procedure: INTRAMEDULLARY (IM) NAIL TIBIAL;  Surgeon: Johnny Bridge, MD;  Location: [facility];  Service: Orthopedics;  Laterality: Right;  . TOTAL HIP ARTHROPLASTY     left  . TOTAL HIP ARTHROPLASTY Right 07/11/2015   Procedure: TOTAL HIP ARTHROPLASTY;  Surgeon: Marchia Bond, MD;  Location: [facility];  Service: Orthopedics;  Laterality: Right;        Home Medications    Prior to Admission medications   Medication Sig Start Date End Date Taking? Authorizing Provider  Acetaminophen (TYLENOL PO) Take 500 mg by mouth daily as needed (pain). Pt unsure of strength     [provider]  acyclovir (ZOVIRAX) 400 MG tablet Take 1 tablet (400 mg total) by mouth 2 (two) times daily. 07/19/18   Brunetta Genera, MD  apixaban (ELIQUIS) 2.5 MG TABS tablet Take 1 tablet (2.5 mg total) by mouth 2 (two) times daily. Patient not taking: Reported on 09/07/2018 04/20/18   Venia Carbon, MD  losartan (COZAAR) 50 MG tablet TAKE 1 TABLET TWICE A DAY Patient taking differently: Take 50 mg by mouth 2 (two) times a day.  07/15/18  Venia Carbon, MD  ondansetron (ZOFRAN) 8 MG tablet Take 1 tablet (8 mg total) by mouth 2 (two) times daily as needed (Nausea or vomiting). 07/19/18   Brunetta Genera, MD  sennosides-docusate sodium (SENOKOT-S) 8.6-50 MG tablet Take 2 tablets by mouth daily. Patient taking differently: Take 2 tablets by mouth as needed for constipation.  07/11/15   Marchia Bond, MD  tamsulosin (FLOMAX) 0.4 MG CAPS capsule Take 1 capsule (0.4 mg total) by mouth daily after supper. 09/12/18   Dorie Rank, MD    Family History Family History  Problem Relation  Age of Onset  . Cancer Mother   . Hyperlipidemia Sister   . Hypertension Sister     Social History Social History   Tobacco Use  . Smoking status: Never Smoker  . Smokeless tobacco: Never Used  Substance Use Topics  . Alcohol use: Yes    Alcohol/week: 12.0 standard drinks    Types: 12 Cans of beer per week    Comment: 2 beers several x/wk and occas cocktail on top of it.  . Drug use: No     Allergies   Patient has no known allergies.   Review of Systems Review of Systems  All other systems reviewed and are negative.    Physical Exam Updated Vital Signs BP (!) 128/95 (BP Location: Left Arm)   Pulse (!) 106   Temp 98.4 F (36.9 C) (Oral)   Resp 16   SpO2 94%   Physical Exam Vitals signs and nursing note reviewed.  Constitutional:      General: He is not in acute distress.    Appearance: He is well-developed.  HENT:     Head: Normocephalic and atraumatic.     Right Ear: External ear normal.     Left Ear: External ear normal.  Eyes:     General: No scleral icterus.       Right eye: No discharge.        Left eye: No discharge.     Conjunctiva/sclera: Conjunctivae normal.  Neck:     Musculoskeletal: Neck supple.     Trachea: No tracheal deviation.  Cardiovascular:     Rate and Rhythm: Normal rate.  Pulmonary:     Effort: Pulmonary effort is normal. No respiratory distress.     Breath sounds: No stridor.  Abdominal:     General: There is no distension.     Palpations: There is no mass.     Tenderness: There is no abdominal tenderness.  Genitourinary:    Comments: Foley catheter placed by nursing staff, draining amber-colored urine Musculoskeletal:        General: No swelling or deformity.  Skin:    General: Skin is warm and dry.     Findings: No rash.  Neurological:     Mental Status: He is alert.     Cranial Nerves: Cranial nerve deficit: no gross deficits.      ED Treatments / Results  Labs (all labs ordered are listed, but only abnormal  results are displayed) Labs Reviewed  URINALYSIS, ROUTINE W REFLEX MICROSCOPIC - Abnormal; Notable for the following components:      Result Value   Hgb urine dipstick SMALL (*)    All other components within normal limits  BASIC METABOLIC PANEL - Abnormal; Notable for the following components:   CO2 20 (*)    Glucose, Bld 104 (*)    Creatinine, Ser 1.28 (*)    GFR calc non Af Amer 50 (*)  GFR calc Af Amer 58 (*)    Anion gap 17 (*)    All other components within normal limits    EKG None  Radiology No results found.  Procedures Procedures (including critical care time)  Medications Ordered in ED Medications  lidocaine (XYLOCAINE) 2 % jelly 1 application (1 application Urethral Given 09/12/18 2030)     Initial Impression / Assessment and Plan / ED Course  I have reviewed the triage vital signs and the nursing notes.  Pertinent labs & imaging results that were available during my care of the patient were reviewed by me and considered in my medical decision making (see chart for details).  Clinical Course as of Sep 11 2129  Sun Sep 12, 2018  2102 Prior to my eval the nurses placed a Foley catheter.  Patient had significant relief.   [JK]  2103 Bladder scan showed 668 cc of urine prior to catheter placement   [JK]    Clinical Course User Index [JK] Dorie Rank, MD     Labs reviewed.  Urinalysis without signs of infection.  Renal function stable.  Responded to Foley catheter insertion.  Plan on discharge home with outpatient urology follow-up.  I will give him a prescription for Flomax for presumed prostatic hypertrophy.  Final Clinical Impressions(s) / ED Diagnoses   Final diagnoses:  Urinary retention    ED Discharge Orders         Ordered    tamsulosin (FLOMAX) 0.4 MG CAPS capsule  Daily after supper     09/12/18 2103           Dorie Rank, MD 09/12/18 2131  Pt is having some bladder spasms.  Will give a dose of ditropan and tylenol    Dorie Rank,  MD 09/12/18 2134

## 2018-09-12 NOTE — ED Triage Notes (Signed)
Pt reports not being able to urinate since 09/11/2018. Pt was seen on Wed for same and had In and Out catheter placed.

## 2018-09-13 ENCOUNTER — Inpatient Hospital Stay: Payer: Medicare Other

## 2018-09-13 ENCOUNTER — Inpatient Hospital Stay (HOSPITAL_BASED_OUTPATIENT_CLINIC_OR_DEPARTMENT_OTHER): Payer: Medicare Other | Admitting: Hematology

## 2018-09-13 ENCOUNTER — Other Ambulatory Visit: Payer: Self-pay

## 2018-09-13 ENCOUNTER — Other Ambulatory Visit: Payer: Medicare Other

## 2018-09-13 ENCOUNTER — Telehealth: Payer: Self-pay | Admitting: Hematology

## 2018-09-13 VITALS — BP 127/86 | HR 100 | Temp 99.0°F | Resp 18 | Ht 69.0 in | Wt 168.3 lb

## 2018-09-13 DIAGNOSIS — R918 Other nonspecific abnormal finding of lung field: Secondary | ICD-10-CM

## 2018-09-13 DIAGNOSIS — E041 Nontoxic single thyroid nodule: Secondary | ICD-10-CM | POA: Diagnosis not present

## 2018-09-13 DIAGNOSIS — C9000 Multiple myeloma not having achieved remission: Secondary | ICD-10-CM | POA: Diagnosis not present

## 2018-09-13 DIAGNOSIS — I700 Atherosclerosis of aorta: Secondary | ICD-10-CM | POA: Diagnosis not present

## 2018-09-13 DIAGNOSIS — Z5112 Encounter for antineoplastic immunotherapy: Secondary | ICD-10-CM | POA: Diagnosis not present

## 2018-09-13 LAB — CBC WITH DIFFERENTIAL/PLATELET
Abs Immature Granulocytes: 0.01 10*3/uL (ref 0.00–0.07)
Basophils Absolute: 0 10*3/uL (ref 0.0–0.1)
Basophils Relative: 0 %
Eosinophils Absolute: 0 10*3/uL (ref 0.0–0.5)
Eosinophils Relative: 0 %
HCT: 34.6 % — ABNORMAL LOW (ref 39.0–52.0)
Hemoglobin: 12 g/dL — ABNORMAL LOW (ref 13.0–17.0)
Immature Granulocytes: 0 %
Lymphocytes Relative: 36 %
Lymphs Abs: 1.8 10*3/uL (ref 0.7–4.0)
MCH: 34.1 pg — ABNORMAL HIGH (ref 26.0–34.0)
MCHC: 34.7 g/dL (ref 30.0–36.0)
MCV: 98.3 fL (ref 80.0–100.0)
Monocytes Absolute: 0.2 10*3/uL (ref 0.1–1.0)
Monocytes Relative: 4 %
Neutro Abs: 2.9 10*3/uL (ref 1.7–7.7)
Neutrophils Relative %: 60 %
Platelets: 88 10*3/uL — ABNORMAL LOW (ref 150–400)
RBC: 3.52 MIL/uL — ABNORMAL LOW (ref 4.22–5.81)
RDW: 15.9 % — ABNORMAL HIGH (ref 11.5–15.5)
WBC: 5 10*3/uL (ref 4.0–10.5)
nRBC: 0.4 % — ABNORMAL HIGH (ref 0.0–0.2)

## 2018-09-13 LAB — CMP (CANCER CENTER ONLY)
ALT: 17 U/L (ref 0–44)
AST: 19 U/L (ref 15–41)
Albumin: 3 g/dL — ABNORMAL LOW (ref 3.5–5.0)
Alkaline Phosphatase: 46 U/L (ref 38–126)
Anion gap: 13 (ref 5–15)
BUN: 22 mg/dL (ref 8–23)
CO2: 24 mmol/L (ref 22–32)
Calcium: 9.4 mg/dL (ref 8.9–10.3)
Chloride: 99 mmol/L (ref 98–111)
Creatinine: 1.4 mg/dL — ABNORMAL HIGH (ref 0.61–1.24)
GFR, Est AFR Am: 52 mL/min — ABNORMAL LOW (ref >60)
GFR, Estimated: 45 mL/min — ABNORMAL LOW (ref >60)
Glucose, Bld: 92 mg/dL (ref 70–99)
Potassium: 4.5 mmol/L (ref 3.5–5.1)
Sodium: 136 mmol/L (ref 135–145)
Total Bilirubin: 0.9 mg/dL (ref 0.3–1.2)
Total Protein: 8.2 g/dL — ABNORMAL HIGH (ref 6.5–8.1)

## 2018-09-13 NOTE — Telephone Encounter (Signed)
Cancelled appts per 6/22 los.

## 2018-09-14 LAB — MULTIPLE MYELOMA PANEL, SERUM
Albumin SerPl Elph-Mcnc: 3.2 g/dL (ref 2.9–4.4)
Albumin/Glob SerPl: 0.8 (ref 0.7–1.7)
Alpha 1: 0.3 g/dL (ref 0.0–0.4)
Alpha2 Glob SerPl Elph-Mcnc: 1 g/dL (ref 0.4–1.0)
B-Globulin SerPl Elph-Mcnc: 0.6 g/dL — ABNORMAL LOW (ref 0.7–1.3)
Gamma Glob SerPl Elph-Mcnc: 2.4 g/dL — ABNORMAL HIGH (ref 0.4–1.8)
Globulin, Total: 4.3 g/dL — ABNORMAL HIGH (ref 2.2–3.9)
IgA: 2157 mg/dL — ABNORMAL HIGH (ref 61–437)
IgG (Immunoglobin G), Serum: 458 mg/dL — ABNORMAL LOW (ref 603–1613)
IgM (Immunoglobulin M), Srm: 15 mg/dL (ref 15–143)
M Protein SerPl Elph-Mcnc: 1.9 g/dL — ABNORMAL HIGH
Total Protein ELP: 7.5 g/dL (ref 6.0–8.5)

## 2018-09-17 ENCOUNTER — Telehealth: Payer: Self-pay | Admitting: *Deleted

## 2018-09-17 ENCOUNTER — Other Ambulatory Visit: Payer: Self-pay | Admitting: *Deleted

## 2018-09-17 ENCOUNTER — Other Ambulatory Visit: Payer: Self-pay | Admitting: Hematology

## 2018-09-17 DIAGNOSIS — C9000 Multiple myeloma not having achieved remission: Secondary | ICD-10-CM

## 2018-09-17 DIAGNOSIS — R6251 Failure to thrive (child): Secondary | ICD-10-CM

## 2018-09-17 DIAGNOSIS — R627 Adult failure to thrive: Secondary | ICD-10-CM

## 2018-09-17 NOTE — Telephone Encounter (Signed)
Patient's daughter Ria Comment) called to ask when they can expect a call from [facility].  Contacted [place] w/[facility] who states she will follow up on referral placed in Epic by Dr.Kale.  Contacted daughter to inform that [facility] was contacted and that they will be in touch with patient. Daughter verbalized understanding.

## 2018-09-17 NOTE — Telephone Encounter (Signed)
[facility] notified office that they are not currently accepting patient's insurance.  [place] [phone]. They requested the referral be faxed to [phone] and they would contact office. Referral faxed, fax confirmation received.

## 2018-09-20 ENCOUNTER — Other Ambulatory Visit: Payer: Medicare Other

## 2018-09-20 ENCOUNTER — Ambulatory Visit: Payer: Medicare Other

## 2018-09-21 ENCOUNTER — Telehealth: Payer: Self-pay | Admitting: Internal Medicine

## 2018-09-21 ENCOUNTER — Telehealth: Payer: Self-pay | Admitting: *Deleted

## 2018-09-21 NOTE — Telephone Encounter (Signed)
Daughter Ria Comment called- they have not heard from home health and father is much worse. He was not out of bed from Wednesday through Friday. Is in a lot of pain - groaning and and calling out. Poor intake of food and fluids. urinary Catheter seems to be causing discomfort (appt with urologist on Thursday). Daughter does not know how she will get him out of bed to go  appt, but she thinks he should go and she'll try. She is wondering if he should be referred to Hospice at this time. Informed her that Dr. Irene Limbo will be given the information and that will f/u with [facility] health to ask them to contact patient.    Contacted Raquel Sarna at [facility], [phone] in ref. to faxed referral sent Friday 6/26 , fax [phone]. Raquel Sarna did not have have record of fax received, stating computers were down. Referral re-faxed, fax confirmation received.

## 2018-09-21 NOTE — Telephone Encounter (Signed)
Faxed demographics and Dr. Grier Mitts last [place] note to [facility] at [phone]. Fax confirmation received.

## 2018-09-21 NOTE — Telephone Encounter (Signed)
Got the note from the oncologist about holding therapy--so called and spoke to wife He is in the bed and trouble even getting up 2 ER visits due to urinary retention and now has Foley Has urology appt on Thursday---daughter will help his wife get him there Going back to oncology in 3 weeks to decide about whether to restart any Rx If he decides against Rx, can consider hospice (wife seemed amenable). I also mentioned that I could then start home visits

## 2018-09-23 ENCOUNTER — Emergency Department (HOSPITAL_COMMUNITY): Payer: Medicare Other

## 2018-09-23 ENCOUNTER — Other Ambulatory Visit: Payer: Self-pay

## 2018-09-23 ENCOUNTER — Inpatient Hospital Stay (HOSPITAL_COMMUNITY)
Admission: EM | Admit: 2018-09-23 | Discharge: 2018-09-26 | DRG: 871 | Disposition: A | Discharge status: Home Health | Payer: Medicare Other | Attending: Internal Medicine | Admitting: Internal Medicine

## 2018-09-23 ENCOUNTER — Telehealth: Payer: Self-pay | Admitting: *Deleted

## 2018-09-23 DIAGNOSIS — E785 Hyperlipidemia, unspecified: Secondary | ICD-10-CM | POA: Diagnosis present

## 2018-09-23 DIAGNOSIS — G92 Toxic encephalopathy: Secondary | ICD-10-CM | POA: Diagnosis present

## 2018-09-23 DIAGNOSIS — E869 Volume depletion, unspecified: Secondary | ICD-10-CM | POA: Diagnosis not present

## 2018-09-23 DIAGNOSIS — D472 Monoclonal gammopathy: Secondary | ICD-10-CM | POA: Diagnosis present

## 2018-09-23 DIAGNOSIS — A411 Sepsis due to other specified staphylococcus: Principal | ICD-10-CM | POA: Diagnosis present

## 2018-09-23 DIAGNOSIS — Z96643 Presence of artificial hip joint, bilateral: Secondary | ICD-10-CM | POA: Diagnosis present

## 2018-09-23 DIAGNOSIS — I959 Hypotension, unspecified: Secondary | ICD-10-CM | POA: Diagnosis present

## 2018-09-23 DIAGNOSIS — Z809 Family history of malignant neoplasm, unspecified: Secondary | ICD-10-CM | POA: Diagnosis not present

## 2018-09-23 DIAGNOSIS — N179 Acute kidney failure, unspecified: Secondary | ICD-10-CM | POA: Diagnosis present

## 2018-09-23 DIAGNOSIS — A419 Sepsis, unspecified organism: Secondary | ICD-10-CM | POA: Diagnosis not present

## 2018-09-23 DIAGNOSIS — Z7901 Long term (current) use of anticoagulants: Secondary | ICD-10-CM | POA: Diagnosis not present

## 2018-09-23 DIAGNOSIS — M1990 Unspecified osteoarthritis, unspecified site: Secondary | ICD-10-CM | POA: Diagnosis present

## 2018-09-23 DIAGNOSIS — G9341 Metabolic encephalopathy: Secondary | ICD-10-CM

## 2018-09-23 DIAGNOSIS — C9000 Multiple myeloma not having achieved remission: Secondary | ICD-10-CM | POA: Diagnosis present

## 2018-09-23 DIAGNOSIS — I4821 Permanent atrial fibrillation: Secondary | ICD-10-CM | POA: Diagnosis present

## 2018-09-23 DIAGNOSIS — Z1159 Encounter for screening for other viral diseases: Secondary | ICD-10-CM | POA: Diagnosis not present

## 2018-09-23 DIAGNOSIS — Z8249 Family history of ischemic heart disease and other diseases of the circulatory system: Secondary | ICD-10-CM | POA: Diagnosis not present

## 2018-09-23 DIAGNOSIS — I1310 Hypertensive heart and chronic kidney disease without heart failure, with stage 1 through stage 4 chronic kidney disease, or unspecified chronic kidney disease: Secondary | ICD-10-CM | POA: Diagnosis present

## 2018-09-23 DIAGNOSIS — Z8349 Family history of other endocrine, nutritional and metabolic diseases: Secondary | ICD-10-CM

## 2018-09-23 DIAGNOSIS — R4182 Altered mental status, unspecified: Secondary | ICD-10-CM | POA: Diagnosis present

## 2018-09-23 DIAGNOSIS — N183 Chronic kidney disease, stage 3 (moderate): Secondary | ICD-10-CM

## 2018-09-23 DIAGNOSIS — I482 Chronic atrial fibrillation, unspecified: Secondary | ICD-10-CM | POA: Diagnosis present

## 2018-09-23 DIAGNOSIS — N390 Urinary tract infection, site not specified: Secondary | ICD-10-CM | POA: Diagnosis not present

## 2018-09-23 DIAGNOSIS — F0390 Unspecified dementia without behavioral disturbance: Secondary | ICD-10-CM | POA: Diagnosis present

## 2018-09-23 LAB — CBC WITH DIFFERENTIAL/PLATELET
Abs Immature Granulocytes: 0.03 10*3/uL (ref 0.00–0.07)
Basophils Absolute: 0 10*3/uL (ref 0.0–0.1)
Basophils Relative: 0 %
Eosinophils Absolute: 0 10*3/uL (ref 0.0–0.5)
Eosinophils Relative: 0 %
HCT: 36.6 % — ABNORMAL LOW (ref 39.0–52.0)
Hemoglobin: 12.1 g/dL — ABNORMAL LOW (ref 13.0–17.0)
Immature Granulocytes: 0 %
Lymphocytes Relative: 17 %
Lymphs Abs: 1.4 10*3/uL (ref 0.7–4.0)
MCH: 33.4 pg (ref 26.0–34.0)
MCHC: 33.1 g/dL (ref 30.0–36.0)
MCV: 101.1 fL — ABNORMAL HIGH (ref 80.0–100.0)
Monocytes Absolute: 0.3 10*3/uL (ref 0.1–1.0)
Monocytes Relative: 4 %
Neutro Abs: 6.3 10*3/uL (ref 1.7–7.7)
Neutrophils Relative %: 79 %
Platelets: 219 10*3/uL (ref 150–400)
RBC: 3.62 MIL/uL — ABNORMAL LOW (ref 4.22–5.81)
RDW: 15.8 % — ABNORMAL HIGH (ref 11.5–15.5)
WBC: 8 10*3/uL (ref 4.0–10.5)
nRBC: 0 % (ref 0.0–0.2)

## 2018-09-23 LAB — COMPREHENSIVE METABOLIC PANEL
ALT: 15 U/L (ref 0–44)
AST: 18 U/L (ref 15–41)
Albumin: 3.1 g/dL — ABNORMAL LOW (ref 3.5–5.0)
Alkaline Phosphatase: 46 U/L (ref 38–126)
Anion gap: 12 (ref 5–15)
BUN: 26 mg/dL — ABNORMAL HIGH (ref 8–23)
CO2: 23 mmol/L (ref 22–32)
Calcium: 9.6 mg/dL (ref 8.9–10.3)
Chloride: 101 mmol/L (ref 98–111)
Creatinine, Ser: 1.39 mg/dL — ABNORMAL HIGH (ref 0.61–1.24)
GFR calc Af Amer: 52 mL/min — ABNORMAL LOW (ref >60)
GFR calc non Af Amer: 45 mL/min — ABNORMAL LOW (ref >60)
Glucose, Bld: 120 mg/dL — ABNORMAL HIGH (ref 70–99)
Potassium: 4.9 mmol/L (ref 3.5–5.1)
Sodium: 136 mmol/L (ref 135–145)
Total Bilirubin: 0.9 mg/dL (ref 0.3–1.2)
Total Protein: 8.5 g/dL — ABNORMAL HIGH (ref 6.5–8.1)

## 2018-09-23 LAB — URINALYSIS, ROUTINE W REFLEX MICROSCOPIC
Bilirubin Urine: NEGATIVE
Glucose, UA: NEGATIVE mg/dL
Ketones, ur: NEGATIVE mg/dL
Nitrite: NEGATIVE
Protein, ur: 30 mg/dL — AB
RBC / HPF: >50 RBC/hpf — ABNORMAL HIGH (ref 0–5)
Specific Gravity, Urine: 1.023 (ref 1.005–1.030)
pH: 6 (ref 5.0–8.0)

## 2018-09-23 LAB — LACTIC ACID, PLASMA: Lactic Acid, Venous: 2.1 mmol/L (ref 0.5–1.9)

## 2018-09-23 LAB — SARS CORONAVIRUS 2 BY RT PCR (HOSPITAL ORDER, PERFORMED IN ~~LOC~~ HOSPITAL LAB): SARS Coronavirus 2: NEGATIVE

## 2018-09-23 MED ORDER — SODIUM CHLORIDE 0.9 % IV SOLN
1.0000 g | Freq: Once | INTRAVENOUS | Status: AC
  Administered 2018-09-23: 1 g via INTRAVENOUS
  Filled 2018-09-23: qty 10

## 2018-09-23 MED ORDER — ONDANSETRON HCL 4 MG/2ML IJ SOLN
4.0000 mg | Freq: Once | INTRAMUSCULAR | Status: AC
  Administered 2018-09-23: 4 mg via INTRAVENOUS
  Filled 2018-09-23: qty 2

## 2018-09-23 MED ORDER — SODIUM CHLORIDE 0.9 % IV BOLUS
1000.0000 mL | Freq: Once | INTRAVENOUS | Status: AC
  Administered 2018-09-23: 1000 mL via INTRAVENOUS

## 2018-09-23 MED ORDER — SODIUM CHLORIDE 0.9 % IV SOLN
INTRAVENOUS | Status: DC
  Administered 2018-09-23: 23:00:00 via INTRAVENOUS

## 2018-09-23 MED ORDER — ENOXAPARIN SODIUM 40 MG/0.4ML ~~LOC~~ SOLN
40.0000 mg | Freq: Every day | SUBCUTANEOUS | Status: DC
  Administered 2018-09-23 – 2018-09-25 (×3): 40 mg via SUBCUTANEOUS
  Filled 2018-09-23 (×3): qty 0.4

## 2018-09-23 MED ORDER — SODIUM CHLORIDE 0.9 % IV SOLN
1.0000 g | INTRAVENOUS | Status: DC
  Administered 2018-09-24 – 2018-09-25 (×2): 1 g via INTRAVENOUS
  Filled 2018-09-23 (×2): qty 1

## 2018-09-23 MED ORDER — MICONAZOLE NITRATE POWD
Freq: Two times a day (BID) | Status: DC
  Administered 2018-09-23 – 2018-09-26 (×6): via TOPICAL

## 2018-09-23 NOTE — ED Notes (Signed)
Holding pt in ED until COVID test results

## 2018-09-23 NOTE — ED Notes (Signed)
New urinary catheter bag placed to collect clean urine sample.

## 2018-09-23 NOTE — ED Triage Notes (Signed)
Pt BIBA from urologist d/t hypotension and weakness.  At MD office, bp was 80/40, pt was alert to baseline, hx of dementia.    20 g l FA, received 250 mL NaCl- BP post fluids 99/72.

## 2018-09-23 NOTE — ED Provider Notes (Signed)
[facility] [place] DEPT Provider Note   CSN: [id number] Arrival date & time: 09/23/18  1554    History   Chief Complaint Chief Complaint  Patient presents with  . Altered Mental Status    HPI Martin Conner is a 85 y.o. male.     The history is provided by the patient and a caregiver.  Altered Mental Status Presenting symptoms: disorientation   Severity:  Mild Most recent episode:  Yesterday Episode history:  Multiple Timing:  Constant Progression:  Unchanged Chronicity:  New Context comment:  Hx of myeloma with current pause of chemotherapy, hypotension at urology follow up today, has indwelling foley. Increasingly altered over the last day with confusion, poor appetite.  Associated symptoms: nausea and weakness   Associated symptoms: no abdominal pain, no fever, no palpitations, no rash, no seizures and no vomiting     Past Medical History:  Diagnosis Date  . Fracture of femoral neck, right ([place]) 07/10/2015   s/p mechanical fall  . Hyperlipidemia   . Hypertensive heart disease   . Lumbar disc disease   . Osteoarthritis   . Permanent atrial fibrillation    a. 07/2012 Echo: EF 55-60%, no rwma, mildly dil RA/LA.  Marland Kitchen Syncope    a. 07/2012 - unclear etiology-->resulted in right tib/fib fx req ORIF/intramedullary nail of tibia.    Patient Active Problem List   Diagnosis Date Noted  . Multiple myeloma ([place]) 07/19/2018  . Counseling regarding advance care planning and goals of care 07/19/2018  . MGUS (monoclonal gammopathy of unknown significance) 05/17/2018  . Dementia ([place]) 04/20/2018  . Memory loss 04/17/2017  . S/P total hip arthroplasty 07/30/2015  . Complete heart block ([place]) 05/13/2015  . Hypertensive heart disease   . Chronic atrial fibrillation   . Hyperlipidemia   . Advanced directives, counseling/discussion 02/03/2014  . Routine general medical examination at a health care facility 01/30/2012  . MITRAL REGURGITATION 06/05/2009  .  Osteoarthrosis, generalized, involving multiple sites 01/20/2008  . DEGENERATIVE DISC DISEASE, LUMBAR SPINE 08/09/2007  . Essential hypertension, benign 05/26/2007    Past Surgical History:  Procedure Laterality Date  . ANKLE SURGERY     right ankle  . TIBIA IM NAIL INSERTION Right 07/21/2012   Procedure: INTRAMEDULLARY (IM) NAIL TIBIAL;  Surgeon: Johnny Bridge, MD;  Location: [place];  Service: Orthopedics;  Laterality: Right;  . TOTAL HIP ARTHROPLASTY     left  . TOTAL HIP ARTHROPLASTY Right 07/11/2015   Procedure: TOTAL HIP ARTHROPLASTY;  Surgeon: Marchia Bond, MD;  Location: [place];  Service: Orthopedics;  Laterality: Right;        Home Medications    Prior to Admission medications   Medication Sig Start Date End Date Taking? Authorizing Provider  Acetaminophen (TYLENOL PO) Take 500 mg by mouth daily as needed (pain). Pt unsure of strength     [provider]  acyclovir (ZOVIRAX) 400 MG tablet Take 1 tablet (400 mg total) by mouth 2 (two) times daily. 07/19/18   Brunetta Genera, MD  apixaban (ELIQUIS) 2.5 MG TABS tablet Take 1 tablet (2.5 mg total) by mouth 2 (two) times daily. Patient not taking: Reported on 09/07/2018 04/20/18   Venia Carbon, MD  losartan (COZAAR) 50 MG tablet TAKE 1 TABLET TWICE A DAY Patient taking differently: Take 50 mg by mouth 2 (two) times a day.  07/15/18   Venia Carbon, MD  ondansetron (ZOFRAN) 8 MG tablet Take 1 tablet (8 mg total) by mouth 2 (two)  times daily as needed (Nausea or vomiting). 07/19/18   Brunetta Genera, MD  sennosides-docusate sodium (SENOKOT-S) 8.6-50 MG tablet Take 2 tablets by mouth daily. Patient taking differently: Take 2 tablets by mouth as needed for constipation.  07/11/15   Marchia Bond, MD  tamsulosin (FLOMAX) 0.4 MG CAPS capsule Take 1 capsule (0.4 mg total) by mouth daily after supper. 09/12/18   Dorie Rank, MD    Family History Family History  Problem Relation Age of Onset  . Cancer Mother   .  Hyperlipidemia Sister   . Hypertension Sister     Social History Social History   Tobacco Use  . Smoking status: Never Smoker  . Smokeless tobacco: Never Used  Substance Use Topics  . Alcohol use: Yes    Alcohol/week: 12.0 standard drinks    Types: 12 Cans of beer per week    Comment: 2 beers several x/wk and occas cocktail on top of it.  . Drug use: No     Allergies   Patient has no known allergies.   Review of Systems Review of Systems  Constitutional: Negative for chills and fever.  HENT: Negative for ear pain and sore throat.   Eyes: Negative for pain and visual disturbance.  Respiratory: Negative for cough and shortness of breath.   Cardiovascular: Negative for chest pain and palpitations.  Gastrointestinal: Positive for nausea. Negative for abdominal pain and vomiting.  Genitourinary: Negative for dysuria and hematuria.  Musculoskeletal: Negative for arthralgias and back pain.  Skin: Negative for color change and rash.  Neurological: Positive for weakness. Negative for seizures and syncope.  All other systems reviewed and are negative.    Physical Exam Updated Vital Signs BP (!) 141/79   Pulse 89   Temp 99.8 F (37.7 C) (Rectal)   Resp 20   SpO2 100%   Physical Exam Vitals signs and nursing note reviewed.  Constitutional:      Appearance: He is well-developed.  HENT:     Head: Normocephalic and atraumatic.     Mouth/Throat:     Mouth: Mucous membranes are moist.  Eyes:     Extraocular Movements: Extraocular movements intact.     Conjunctiva/sclera: Conjunctivae normal.     Pupils: Pupils are equal, round, and reactive to light.  Neck:     Musculoskeletal: Normal range of motion and neck supple.  Cardiovascular:     Rate and Rhythm: Normal rate and regular rhythm.     Pulses: Normal pulses.     Heart sounds: Normal heart sounds. No murmur.  Pulmonary:     Effort: Pulmonary effort is normal. No respiratory distress.     Breath sounds: Normal  breath sounds.  Abdominal:     General: There is no distension.     Palpations: Abdomen is soft.     Tenderness: There is no abdominal tenderness.  Genitourinary:    Penis: Normal.      Comments: Breakdown of skin around shaft of penis and scrotum, stool on urinary foley Skin:    General: Skin is warm and dry.     Capillary Refill: Capillary refill takes less than 2 seconds.  Neurological:     General: No focal deficit present.     Mental Status: He is alert.  Psychiatric:        Mood and Affect: Mood normal.      ED Treatments / Results  Labs (all labs ordered are listed, but only abnormal results are displayed) Labs Reviewed  CBC WITH DIFFERENTIAL/PLATELET -  Abnormal; Notable for the following components:      Result Value   RBC 3.62 (*)    Hemoglobin 12.1 (*)    HCT 36.6 (*)    MCV 101.1 (*)    RDW 15.8 (*)    All other components within normal limits  COMPREHENSIVE METABOLIC PANEL - Abnormal; Notable for the following components:   Glucose, Bld 120 (*)    BUN 26 (*)    Creatinine, Ser 1.39 (*)    Total Protein 8.5 (*)    Albumin 3.1 (*)    GFR calc non Af Amer 45 (*)    GFR calc Af Amer 52 (*)    All other components within normal limits  URINALYSIS, ROUTINE W REFLEX MICROSCOPIC - Abnormal; Notable for the following components:   Color, Urine AMBER (*)    APPearance HAZY (*)    Hgb urine dipstick MODERATE (*)    Protein, ur 30 (*)    Leukocytes,Ua LARGE (*)    RBC / HPF >50 (*)    Bacteria, UA RARE (*)    All other components within normal limits  LACTIC ACID, PLASMA - Abnormal; Notable for the following components:   Lactic Acid, Venous 2.1 (*)    All other components within normal limits  URINE CULTURE  CULTURE, BLOOD (ROUTINE X 2)  CULTURE, BLOOD (ROUTINE X 2)  SARS CORONAVIRUS 2 (HOSPITAL ORDER, [facility])    EKG None  Radiology Ct Head Wo Contrast  Result Date: 09/23/2018 CLINICAL DATA:  85 year old male with altered  mental status and weakness. Multiple myeloma. EXAM: CT HEAD WITHOUT CONTRAST TECHNIQUE: Contiguous axial images were obtained from the base of the skull through the vertex without intravenous contrast. COMPARISON:  09/07/2018 head CT, brain MRI 04/29/2018. FINDINGS: Brain: Chronic encephalomalacia in the posterior right MCA and the right PCA territories. Stable cerebral volume. Small area of cortical encephalomalacia in the more anterior right superior frontal gyrus on series 3, image 25. No midline shift, ventriculomegaly, mass effect, evidence of mass lesion, intracranial hemorrhage or evidence of cortically based acute infarction. Vascular: Extensive Calcified atherosclerosis at the skull base. No suspicious intracranial vascular hyperdensity. Skull: Stable, no lytic osseous lesion identified. Sinuses/Orbits: Visualized paranasal sinuses and mastoids are stable and well pneumatized. Other: Stable orbit and scalp soft tissues. 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 Electronically Signed   By: Genevie Ann M.D.   On: 09/23/2018 18:31    Procedures Procedures (including critical care time)  Medications Ordered in ED Medications  sodium chloride 0.9 % bolus 1,000 mL (0 mLs Intravenous Stopped 09/23/18 1747)  ondansetron (ZOFRAN) injection 4 mg (4 mg Intravenous Given 09/23/18 1651)  cefTRIAXone (ROCEPHIN) 1 g in sodium chloride 0.9 % 100 mL IVPB (1 g Intravenous New Bag/Given 09/23/18 1836)     Initial Impression / Assessment and Plan / ED Course  I have reviewed the triage vital signs and the nursing notes.  Pertinent labs & imaging results that were available during my care of the patient were reviewed by me and considered in my medical decision making (see chart for details).     Martin Conner is an 85 year old male with history of multiple myeloma, urinary retention now status post the Foley who presents TO the ED with  hypotension.  Patient with blood pressure 80/40 at urology follow-up today.  Patient with normal vitals upon arrival here except for mild tachycardia.  No fever.  No white count.  Mild elevation in lactic acid at 2.1.  Urinalysis concerning for infection.  Given IV Rocephin.  Blood cultures have been collected.  Patient given normal saline bolus.  Given IV Zofran.  Patient does have some breakdown around the scrotum and shaft of his penis likely consistent with chronic irritation possibly yeast.  No signs of cellulitis or Fournier's.  Patient appears dehydrated on exam.  According to his daughter his mentation has diminished over the last day.  CT of his head was unremarkable.  Neurologically he appears at baseline.  Concern for possible sepsis although does not meet criteria at this time.  Will hydrate, give IV fluids, will admit for further care.  Patient remained hemodynamically stable throughout my care.  This chart was dictated using voice recognition software.  Despite best efforts to proofread,  errors can occur which can change the documentation meaning.    Final Clinical Impressions(s) / ED Diagnoses   Final diagnoses:  Lower urinary tract infectious disease  Altered mental status, unspecified altered mental status type    ED Discharge Orders    None       Lennice Sites, DO 09/23/18 1909

## 2018-09-23 NOTE — ED Notes (Signed)
Bed: AS60 Expected date:  Expected time:  Means of arrival:  Comments: EMS 85yo 70/40

## 2018-09-23 NOTE — H&P (Signed)
History and Physical  Martin Conner BDZ:[id number] DOB: 1928-01-08 DOA: 09/23/2018  Referring physician: ER provider PCP: Venia Carbon, MD  Outpatient Specialists: Urology Patient coming from: Home  Chief Complaint: Altered mental status and hypotension (blood pressure of 70/40 mmHg)  HPI:  Patient is an 85 year old Caucasian male with past medical history significant for multiple myeloma, kidney disease stage III, permanent atrial fibrillation, hypertension and hyperlipidemia.  Patient is currently on break from chemotherapy for the multiple myeloma as per the ER provider.  Patient has indwelling Foley catheter, and has had such for about 2 weeks.  Patient could not give any significant history due to confusion.  [place]  Apparently, as per collateral information, patient was seen earlier today at the urologist office, found to be more confused with blood pressure of 70/40 mmHg that responded to fluid resuscitation.  Patient was also feeling weak.  Patient was transported to the hospital for further assessment and management.  On presentation, mild tachycardia is noted, and UA revealed large leukocyte esterase, urine WBC of 21-50 with urine specific gravity of 1.023.  CT scan of the head was nonrevealing.  Lactic acid was 2.1.  Hospitalist service was asked to admit patient for further assessment and management of complicated UTI with possible early sepsis.  ED Course: On presentation to the hospital, temperature of 99.8 was documented, blood pressure had improved to 110/78 mmHg, heart rate of 83 to 105 bpm and O2 sat of 96 to 100% on room air.  Chemistry revealed sodium of 136, potassium of 4.8, chloride 101, CO2 23, BUN of 26, serum creatinine of 1.39 (at baseline), blood sugar of 120.  Lactic acid was 2.1.  CBC revealed WBC of 8, hemoglobin of 12.1, hematocrit 36.1, MCV of 101.1, and platelet count of 219.  Patient's urine and blood have been sent for cultures.   ER provider has prescribed IV Rocephin 1 g.    Pertinent labs: As documented above.  Imaging: independently reviewed.   Review of Systems:  Unobtainable.   Past Medical History:  Diagnosis Date  . Fracture of femoral neck, right ([place]) 07/10/2015   s/p mechanical fall  . Hyperlipidemia   . Hypertensive heart disease   . Lumbar disc disease   . Osteoarthritis   . Permanent atrial fibrillation    a. 07/2012 Echo: EF 55-60%, no rwma, mildly dil RA/LA.  Marland Kitchen Syncope    a. 07/2012 - unclear etiology-->resulted in right tib/fib fx req ORIF/intramedullary nail of tibia.    Past Surgical History:  Procedure Laterality Date  . ANKLE SURGERY     right ankle  . TIBIA IM NAIL INSERTION Right 07/21/2012   Procedure: INTRAMEDULLARY (IM) NAIL TIBIAL;  Surgeon: Johnny Bridge, MD;  Location: [facility];  Service: Orthopedics;  Laterality: Right;  . TOTAL HIP ARTHROPLASTY     left  . TOTAL HIP ARTHROPLASTY Right 07/11/2015   Procedure: TOTAL HIP ARTHROPLASTY;  Surgeon: Marchia Bond, MD;  Location: [facility];  Service: Orthopedics;  Laterality: Right;     reports that he has never smoked. He has never used smokeless tobacco. He reports current alcohol use of about 12.0 standard drinks of alcohol per week. He reports that he does not use drugs.  No Known Allergies  Family History  Problem Relation Age of Onset  . Cancer Mother   . Hyperlipidemia Sister   . Hypertension Sister      Prior to Admission medications   Medication Sig Start  Date End Date Taking? Authorizing Provider  Acetaminophen (TYLENOL PO) Take 500 mg by mouth daily as needed (pain). Pt unsure of strength     [provider]  acyclovir (ZOVIRAX) 400 MG tablet Take 1 tablet (400 mg total) by mouth 2 (two) times daily. 07/19/18   Brunetta Genera, MD  apixaban (ELIQUIS) 2.5 MG TABS tablet Take 1 tablet (2.5 mg total) by mouth 2 (two) times daily. Patient not taking: Reported on 09/07/2018 04/20/18   Venia Carbon, MD   losartan (COZAAR) 50 MG tablet TAKE 1 TABLET TWICE A DAY Patient taking differently: Take 50 mg by mouth 2 (two) times a day.  07/15/18   Venia Carbon, MD  ondansetron (ZOFRAN) 8 MG tablet Take 1 tablet (8 mg total) by mouth 2 (two) times daily as needed (Nausea or vomiting). 07/19/18   Brunetta Genera, MD  sennosides-docusate sodium (SENOKOT-S) 8.6-50 MG tablet Take 2 tablets by mouth daily. Patient taking differently: Take 2 tablets by mouth as needed for constipation.  07/11/15   Marchia Bond, MD  tamsulosin (FLOMAX) 0.4 MG CAPS capsule Take 1 capsule (0.4 mg total) by mouth daily after supper. 09/12/18   Dorie Rank, MD    Physical Exam: Vitals:   09/23/18 1630 09/23/18 1657 09/23/18 1658 09/23/18 1800  BP:    (!) 141/79  Pulse:  96 83 89  Resp:  (!) 23 (!) 25 20  Temp: 99.8 F (37.7 C)     TempSrc: Rectal     SpO2:  97% 96% 100%    Constitutional:  . Appears calm and comfortable.  Patient has indwelling Foley catheter. Eyes:  . No pallor. No jaundice.  ENMT:  . external ears, nose appear normal.  Dry buccal mucosa is noted. Neck:  . Neck is supple. No JVD Respiratory:  . CTA bilaterally, no w/r/r.  . Respiratory effort normal. No retractions or accessory muscle use Cardiovascular:  . S1S2 . No LE extremity edema   Abdomen:  . Abdomen is soft and non tender. Organs are difficult to assess. Neurologic:  . Awake and alert. . Moves all limbs.  Wt Readings from Last 3 Encounters:  09/13/18 76.3 kg  09/07/18 80.7 kg  08/23/18 77.5 kg    I have personally reviewed following labs and imaging studies  Labs on Admission:  CBC: Recent Labs  Lab 09/23/18 1654  WBC 8.0  NEUTROABS 6.3  HGB 12.1*  HCT 36.6*  MCV 101.1*  PLT 622   Basic Metabolic Panel: Recent Labs  Lab 09/23/18 1654  NA 136  K 4.9  CL 101  CO2 23  GLUCOSE 120*  BUN 26*  CREATININE 1.39*  CALCIUM 9.6   Liver Function Tests: Recent Labs  Lab 09/23/18 1654  AST 18  ALT 15   ALKPHOS 46  BILITOT 0.9  PROT 8.5*  ALBUMIN 3.1*   No results for input(s): LIPASE, AMYLASE in the last 168 hours. No results for input(s): AMMONIA in the last 168 hours. Coagulation Profile: No results for input(s): INR, PROTIME in the last 168 hours. Cardiac Enzymes: No results for input(s): CKTOTAL, CKMB, CKMBINDEX, TROPONINI in the last 168 hours. BNP (last 3 results) No results for input(s): PROBNP in the last 8760 hours. HbA1C: No results for input(s): HGBA1C in the last 72 hours. CBG: No results for input(s): GLUCAP in the last 168 hours. Lipid Profile: No results for input(s): CHOL, HDL, LDLCALC, TRIG, CHOLHDL, LDLDIRECT in the last 72 hours. Thyroid Function Tests: No results for  input(s): TSH, T4TOTAL, FREET4, T3FREE, THYROIDAB in the last 72 hours. Anemia Panel: No results for input(s): VITAMINB12, FOLATE, FERRITIN, TIBC, IRON, RETICCTPCT in the last 72 hours. Urine analysis:    Component Value Date/Time   COLORURINE AMBER (A) 09/23/2018 1802   APPEARANCEUR HAZY (A) 09/23/2018 1802   LABSPEC 1.023 09/23/2018 1802   PHURINE 6.0 09/23/2018 1802   GLUCOSEU NEGATIVE 09/23/2018 1802   HGBUR MODERATE (A) 09/23/2018 1802   BILIRUBINUR NEGATIVE 09/23/2018 1802   KETONESUR NEGATIVE 09/23/2018 1802   PROTEINUR 30 (A) 09/23/2018 1802   UROBILINOGEN 2.0 (H) 07/21/2012 1145   NITRITE NEGATIVE 09/23/2018 1802   LEUKOCYTESUR LARGE (A) 09/23/2018 1802   Sepsis Labs: @LABRCNTIP (procalcitonin:4,lacticidven:4) )No results found for this or any previous visit (from the past 240 hour(s)).    Radiological Exams on Admission: Ct Head Wo Contrast  Result Date: 09/23/2018 CLINICAL DATA:  85 year old male with altered mental status and weakness. Multiple myeloma. EXAM: CT HEAD WITHOUT CONTRAST TECHNIQUE: Contiguous axial images were obtained from the base of the skull through the vertex without intravenous contrast. COMPARISON:  09/07/2018 head CT, brain MRI 04/29/2018. FINDINGS:  Brain: Chronic encephalomalacia in the posterior right MCA and the right PCA territories. Stable cerebral volume. Small area of cortical encephalomalacia in the more anterior right superior frontal gyrus on series 3, image 25. No midline shift, ventriculomegaly, mass effect, evidence of mass lesion, intracranial hemorrhage or evidence of cortically based acute infarction. Vascular: Extensive Calcified atherosclerosis at the skull base. No suspicious intracranial vascular hyperdensity. Skull: Stable, no lytic osseous lesion identified. Sinuses/Orbits: Visualized paranasal sinuses and mastoids are stable and well pneumatized. Other: Stable orbit and scalp soft tissues. 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 Electronically Signed   By: Genevie Ann M.D.   On: 09/23/2018 18:31    Active Problems:   Sepsis secondary to UTI [facility])   Assessment/Plan Acute encephalopathy, likely combined metabolic and toxic, possibly secondary to complicated UTI/early sepsis: -Admit patient for further assessment and management. -Continue volume resuscitation. -Continue IV Rocephin -Follow urine culture and blood culture results -Management will depend on hospital course -Patient may have have underlying dementing illness not previously documented.   Complicated UTI/early sepsis: Follow urine cultures Follow blood cultures Fluid resuscitation Continue IV antibiotic Further management will depend on above.  Volume depletion: Continue volume resuscitation. Continue to assess closely.  Chronic kidney disease stage III: This is at baseline. Monitor renal function and electrolytes during the hospital stay.  History of multiple myeloma: Monitor for now. Consult oncology if deemed necessary.   DVT prophylaxis: Subacute Lovenox Code Status: Full code.  Continue to address with family. Family Communication:  Disposition Plan: This  will depend on hospital course Consults called: None for now.  Low threshold to consult oncology. Admission status: Inpatient  Time spent: 65 minutes  Dana Allan, MD  [facility] Pager #: [phone] 7PM-7AM contact night coverage as above  09/23/2018, 7:17 PM

## 2018-09-23 NOTE — ED Notes (Signed)
Date and time results received: 09/23/18 5:44 PM  (use smartphrase ".now" to insert current time)  Test: Lactic Critical Value: 2.1  Name of Provider Notified: [place]  Orders Received? Or Actions Taken?: awaiting orders

## 2018-09-23 NOTE — ED Notes (Signed)
ED TO INPATIENT HANDOFF REPORT  Name/Age/Gender Martin Conner 85 y.o. male  Code Status Code Status History    Date Active Date Inactive Code Status Order ID Comments User Context   07/11/2015 0054 07/13/2015 1706 Full Code [id number]  Vianne Bulls, MD ED   05/13/2015 1804 05/15/2015 1810 Full Code [id number]  Rogelia Mire, NP Inpatient   07/20/2012 1754 07/23/2012 1754 Full Code [id number]  Annita Brod, MD ED   Advance Care Planning Activity      Home/SNF/Other Home  Chief Complaint hypotension; weakness  Level of Care/Admitting Diagnosis ED Disposition    ED Disposition Condition [facility] Area: [facility] [[id number]]  Level of Care: Telemetry [5]  Admit to tele based on following criteria: Complex arrhythmia (Bradycardia/Tachycardia)  Covid Evaluation: Asymptomatic Screening Protocol (No Symptoms)  Diagnosis: Sepsis secondary to UTI [facility]) [[id number]]  Admitting Physician: Shirlean Mylar  Attending Physician: Dana Allan I [[id number]]  Estimated length of stay: past midnight tomorrow  Certification:: I certify this patient will need inpatient services for at least 2 midnights  PT Class (Do Not Modify): Inpatient [101]  PT Acc Code (Do Not Modify): Private [1]       Medical History Past Medical History:  Diagnosis Date  . Fracture of femoral neck, right ([place]) 07/10/2015   s/p mechanical fall  . Hyperlipidemia   . Hypertensive heart disease   . Lumbar disc disease   . Osteoarthritis   . Permanent atrial fibrillation    a. 07/2012 Echo: EF 55-60%, no rwma, mildly dil RA/LA.  Marland Kitchen Syncope    a. 07/2012 - unclear etiology-->resulted in right tib/fib fx req ORIF/intramedullary nail of tibia.    Allergies No Known Allergies  IV Location/Drains/Wounds Patient Lines/Drains/Airways Status   Active Line/Drains/Airways    Name:   Placement date:   Placement time:   Site:   Days:   Peripheral IV 09/23/18 Right;Posterior  Forearm   09/23/18    1650    Forearm   less than 1   Urethral Catheter Ilene H. RN Straight-tip;Latex 16 Fr.   09/12/18    2031    Straight-tip;Latex   11   Incision 07/21/12 Leg Right   07/21/12    2035     2255   Incision (Closed) 07/11/15 Hip Right   07/11/15    1713     1170          Labs/Imaging Results for orders placed or performed during the hospital encounter of 09/23/18 (from the past 48 hour(s))  CBC with Differential     Status: Abnormal   Collection Time: 09/23/18  4:54 PM  Result Value Ref Range   WBC 8.0 4.0 - 10.5 K/uL   RBC 3.62 (L) 4.22 - 5.81 MIL/uL   Hemoglobin 12.1 (L) 13.0 - 17.0 g/dL   HCT 36.6 (L) 39.0 - 52.0 %   MCV 101.1 (H) 80.0 - 100.0 fL   MCH 33.4 26.0 - 34.0 pg   MCHC 33.1 30.0 - 36.0 g/dL   RDW 15.8 (H) 11.5 - 15.5 %   Platelets 219 150 - 400 K/uL   nRBC 0.0 0.0 - 0.2 %   Neutrophils Relative % 79 %   Neutro Abs 6.3 1.7 - 7.7 K/uL   Lymphocytes Relative 17 %   Lymphs Abs 1.4 0.7 - 4.0 K/uL   Monocytes Relative 4 %   Monocytes Absolute 0.3 0.1 - 1.0 K/uL   Eosinophils Relative 0 %  Eosinophils Absolute 0.0 0.0 - 0.5 K/uL   Basophils Relative 0 %   Basophils Absolute 0.0 0.0 - 0.1 K/uL   Immature Granulocytes 0 %   Abs Immature Granulocytes 0.03 0.00 - 0.07 K/uL    Comment: Performed at [facility], [place] [address] [place], [place] [address]  Comprehensive metabolic panel     Status: Abnormal   Collection Time: 09/23/18  4:54 PM  Result Value Ref Range   Sodium 136 135 - 145 mmol/L   Potassium 4.9 3.5 - 5.1 mmol/L   Chloride 101 98 - 111 mmol/L   CO2 23 22 - 32 mmol/L   Glucose, Bld 120 (H) 70 - 99 mg/dL   BUN 26 (H) 8 - 23 mg/dL   Creatinine, Ser 1.39 (H) 0.61 - 1.24 mg/dL   Calcium 9.6 8.9 - 10.3 mg/dL   Total Protein 8.5 (H) 6.5 - 8.1 g/dL   Albumin 3.1 (L) 3.5 - 5.0 g/dL   AST 18 15 - 41 U/L   ALT 15 0 - 44 U/L   Alkaline Phosphatase 46 38 - 126 U/L   Total Bilirubin 0.9 0.3 - 1.2 mg/dL   GFR calc non Af  Amer 45 (L) >60 mL/min   GFR calc Af Amer 52 (L) >60 mL/min   Anion gap 12 5 - 15    Comment: Performed at [facility], [place] [address] [place], [place] [address]  Lactic acid, plasma     Status: Abnormal   Collection Time: 09/23/18  4:54 PM  Result Value Ref Range   Lactic Acid, Venous 2.1 (HH) 0.5 - 1.9 mmol/L    Comment: CRITICAL RESULT CALLED TO, READ BACK BY AND VERIFIED WITH: BRILL,M. RN _0  ON 07.02.2020 BY COHEN,K Performed at [facility], [place] [address] [place], [place] [address]   Urinalysis, Routine w reflex microscopic     Status: Abnormal   Collection Time: 09/23/18  6:02 PM  Result Value Ref Range   Color, Urine AMBER (A) YELLOW    Comment: BIOCHEMICALS MAY BE AFFECTED BY COLOR   APPearance HAZY (A) CLEAR   Specific Gravity, Urine 1.023 1.005 - 1.030   pH 6.0 5.0 - 8.0   Glucose, UA NEGATIVE NEGATIVE mg/dL   Hgb urine dipstick MODERATE (A) NEGATIVE   Bilirubin Urine NEGATIVE NEGATIVE   Ketones, ur NEGATIVE NEGATIVE mg/dL   Protein, ur 30 (A) NEGATIVE mg/dL   Nitrite NEGATIVE NEGATIVE   Leukocytes,Ua LARGE (A) NEGATIVE   RBC / HPF >50 (H) 0 - 5 RBC/hpf   WBC, UA 21-50 0 - 5 WBC/hpf   Bacteria, UA RARE (A) NONE SEEN   Squamous Epithelial / LPF 0-5 0 - 5   Mucus PRESENT    Hyaline Casts, UA PRESENT     Comment: Performed at [facility], [place] [address], [place], [place] [address]   Ct Head Wo Contrast  Result Date: 09/23/2018 CLINICAL DATA:  85 year old male with altered mental status and weakness. Multiple myeloma. EXAM: CT HEAD WITHOUT CONTRAST TECHNIQUE: Contiguous axial images were obtained from the base of the skull through the vertex without intravenous contrast. COMPARISON:  09/07/2018 head CT, brain MRI 04/29/2018. FINDINGS: Brain: Chronic encephalomalacia in the posterior right MCA and the right PCA territories. Stable cerebral volume. Small area of cortical encephalomalacia in the more anterior  right superior frontal gyrus on series 3, image 25. No midline shift, ventriculomegaly, mass effect, evidence of mass lesion, intracranial hemorrhage or evidence of cortically  based acute infarction. Vascular: Extensive Calcified atherosclerosis at the skull base. No suspicious intracranial vascular hyperdensity. Skull: Stable, no lytic osseous lesion identified. Sinuses/Orbits: Visualized paranasal sinuses and mastoids are stable and well pneumatized. Other: Stable orbit and scalp soft tissues. 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 Electronically Signed   By: Genevie Ann M.D.   On: 09/23/2018 18:31    Pending Labs Unresulted Labs (From admission, onward)    Start     Ordered   09/23/18 1907  Blood culture (routine x 2)  BLOOD CULTURE X 2,   STAT    Question:  Patient immune status  Answer:  Normal   09/23/18 1906   09/23/18 1907  SARS Coronavirus 2 (CEPHEID- Performed in [place] hospital lab), Hosp Order  (Symptomatic Patients Labs with Precautions )  Once,   STAT    Question:  Patient immune status  Answer:  Normal   09/23/18 1906   09/23/18 1617  Urine culture  ONCE - STAT,   STAT     09/23/18 1616   Signed and Held  CBC  (enoxaparin (LOVENOX)    CrCl >/= 30 ml/min)  Once,   R    Comments: Baseline for enoxaparin therapy IF NOT ALREADY DRAWN.  Notify MD if PLT < 100 K.    Signed and Held   Signed and Held  Creatinine, serum  (enoxaparin (LOVENOX)    CrCl >/= 30 ml/min)  Once,   R    Comments: Baseline for enoxaparin therapy IF NOT ALREADY DRAWN.    Signed and Held   Signed and Held  Creatinine, serum  (enoxaparin (LOVENOX)    CrCl >/= 30 ml/min)  Weekly,   R    Comments: while on enoxaparin therapy    Signed and Held   Signed and Held  Magnesium  Once,   R     Signed and Held   Signed and Held  Phosphorus  Once,   R     Signed and Held   Signed and Held  Basic metabolic panel  Tomorrow morning,    R     Signed and Held   Signed and Held  CBC  Tomorrow morning,   R     Signed and Held          Vitals/Pain Today's Vitals   09/23/18 2000 09/23/18 2030 09/23/18 2100 09/23/18 2130  BP: (!) 124/104 111/76 93/63 128/68  Pulse: 87 70 93 89  Resp: (!) 23 (!) 25 (!) 21 20  Temp:      TempSrc:      SpO2: 99% 97% 100% 99%    Isolation Precautions Airborne and Contact precautions  Medications Medications  sodium chloride 0.9 % bolus 1,000 mL (0 mLs Intravenous Stopped 09/23/18 1747)  ondansetron (ZOFRAN) injection 4 mg (4 mg Intravenous Given 09/23/18 1651)  cefTRIAXone (ROCEPHIN) 1 g in sodium chloride 0.9 % 100 mL IVPB (0 g Intravenous Stopped 09/23/18 2039)    Mobility walks

## 2018-09-23 NOTE — Telephone Encounter (Signed)
Contacted by Nira Conn, nurse with [facility]. They are asking for verbal order for Social Work support and Occupational Therapy support. Dr. Irene Limbo gave verbal order. Contacted Heather and informed her that verbal order is given by MD. She will inform office. They will fax signable order to office for MD signature

## 2018-09-24 DIAGNOSIS — G9341 Metabolic encephalopathy: Secondary | ICD-10-CM

## 2018-09-24 LAB — MAGNESIUM: Magnesium: 2 mg/dL (ref 1.7–2.4)

## 2018-09-24 LAB — BASIC METABOLIC PANEL
Anion gap: 11 (ref 5–15)
BUN: 23 mg/dL (ref 8–23)
CO2: 22 mmol/L (ref 22–32)
Calcium: 9.3 mg/dL (ref 8.9–10.3)
Chloride: 104 mmol/L (ref 98–111)
Creatinine, Ser: 1.06 mg/dL (ref 0.61–1.24)
GFR calc Af Amer: >60 mL/min (ref >60)
GFR calc non Af Amer: >60 mL/min (ref >60)
Glucose, Bld: 90 mg/dL (ref 70–99)
Potassium: 5.1 mmol/L (ref 3.5–5.1)
Sodium: 137 mmol/L (ref 135–145)

## 2018-09-24 LAB — CBC
HCT: 32.2 % — ABNORMAL LOW (ref 39.0–52.0)
Hemoglobin: 10.8 g/dL — ABNORMAL LOW (ref 13.0–17.0)
MCH: 34.1 pg — ABNORMAL HIGH (ref 26.0–34.0)
MCHC: 33.5 g/dL (ref 30.0–36.0)
MCV: 101.6 fL — ABNORMAL HIGH (ref 80.0–100.0)
Platelets: 176 10*3/uL (ref 150–400)
RBC: 3.17 MIL/uL — ABNORMAL LOW (ref 4.22–5.81)
RDW: 15.9 % — ABNORMAL HIGH (ref 11.5–15.5)
WBC: 7.5 10*3/uL (ref 4.0–10.5)
nRBC: 0 % (ref 0.0–0.2)

## 2018-09-24 LAB — CREATININE, SERUM
Creatinine, Ser: 1.06 mg/dL (ref 0.61–1.24)
GFR calc Af Amer: >60 mL/min (ref >60)
GFR calc non Af Amer: >60 mL/min (ref >60)

## 2018-09-24 LAB — PHOSPHORUS: Phosphorus: 3.7 mg/dL (ref 2.5–4.6)

## 2018-09-24 NOTE — Progress Notes (Signed)
PROGRESS NOTE    Martin Conner  OMV:[id number] DOB: [birth year] DOA: 09/23/2018 PCP: Venia Carbon, MD     Brief Narrative:  Martin Conner is 85 year old Caucasian male with past medical history significant for multiple myeloma, kidney disease stage III, permanent atrial fibrillation, hypertension and hyperlipidemia.  Patient is currently on break from chemotherapy for the multiple myeloma as per the ER provider.  Patient has indwelling Foley catheter, and has had such for about 2 weeks.  Patient could not give any significant history due to confusion.  Apparently, as per collateral information, patient was seen earlier today at the urologist office, found to be more confused with blood pressure of 70/40 that responded to fluid resuscitation.  Patient was also feeling weak.  Patient was transported to the hospital for further assessment and management. Hospitalist service was asked to admit patient for further assessment and management of complicated UTI with possible early sepsis.  New events last 24 hours / Subjective: Patient remains intermittently confused.  He is alert and oriented to self and place only but not to year.  No complaints this morning otherwise.  Spoke with wife over the phone, patient does have some underlying dementia.  At baseline, he has intermittent confusion, typically oriented to place but not to the current president or the year.  She is not surprised that patient has been asking for her, patient has been confused because she is out of town currently.  Assessment & Plan:   Principal Problem:   Complicated UTI (urinary tract infection) Active Problems:   Dementia ([place])   Multiple myeloma (H[facility])   Acute metabolic encephalopathy    Acute encephalopathy, likely combined metabolic and toxic, possibly secondary to complicated UTI/early sepsis -Patient has underlying dementia -CT head negative for acute intracranial pathology -Monitor, delirium precaution -Mentation seems  back to his baseline  Complicated UTI, present on admission, related with chronic Foley catheter use -Sepsis ruled out -Urine culture pending -Blood culture pending -Continue empiric Rocephin  AKI -Creatinine improved to 1.06  History of multiple myeloma -Follow as outpatient    DVT prophylaxis: Lovenox Code Status: Full Family Communication: Spoke with wife over the phone Disposition Plan: Pending further treatment of UTI   Consultants:   None  Procedures:   None   Antimicrobials:  Anti-infectives (From admission, onward)   Start     Dose/Rate Route Frequency Ordered Stop   09/24/18 1800  cefTRIAXone (ROCEPHIN) 1 g in sodium chloride 0.9 % 100 mL IVPB     1 g 200 mL/hr over 30 Minutes Intravenous Every 24 hours 09/23/18 2256     09/23/18 1830  cefTRIAXone (ROCEPHIN) 1 g in sodium chloride 0.9 % 100 mL IVPB     1 g 200 mL/hr over 30 Minutes Intravenous  Once 09/23/18 1821 09/23/18 2039       Objective: Vitals:   09/23/18 2100 09/23/18 2130 09/23/18 2248 09/24/18 0511  BP: 93/63 128/68 (!) 146/75 (!) 155/93  Pulse: 93 89 75 72  Resp: (!) _0 Temp:   98.3 F (36.8 C) 98.2 F (36.8 C)  TempSrc:   Oral Oral  SpO2: 100% 99% 100% 100%    Intake/Output Summary (Last 24 hours) at 09/24/2018 1103 Last data filed at 09/24/2018 1003 Gross per 24 hour  Intake 1549.1 ml  Output 450 ml  Net 1099.1 ml   There were no vitals filed for this visit.  Examination:  General exam: Appears calm and comfortable  Respiratory system: Clear  to auscultation. Respiratory effort normal. Cardiovascular system: S1 & S2 heard, RRR. No JVD, murmurs, rubs, gallops or clicks. No pedal edema. Gastrointestinal system: Abdomen is nondistended, soft and nontender. No organomegaly or masses felt. Normal bowel sounds heard. Central nervous system: Alert and oriented to self and place only  Extremities: Symmetric 5 x 5 power. Skin: No rashes, lesions or ulcers Psychiatry:  Dementia  Data Reviewed: I have personally reviewed following labs and imaging studies  CBC: Recent Labs  Lab 09/23/18 1654 09/24/18 0201  WBC 8.0 7.5  NEUTROABS 6.3  --   HGB 12.1* 10.8*  HCT 36.6* 32.2*  MCV 101.1* 101.6*  PLT 219 323   Basic Metabolic Panel: Recent Labs  Lab 09/23/18 1654 09/24/18 0201  NA 136 137  K 4.9 5.1  CL 101 104  CO2 23 22  GLUCOSE 120* 90  BUN 26* 23  CREATININE 1.39* 1.06  1.06  CALCIUM 9.6 9.3  MG  --  2.0  PHOS  --  3.7   GFR: Estimated Creatinine Clearance: 48.2 mL/min (by C-G formula based on SCr of 1.06 mg/dL). Liver Function Tests: Recent Labs  Lab 09/23/18 1654  AST 18  ALT 15  ALKPHOS 46  BILITOT 0.9  PROT 8.5*  ALBUMIN 3.1*   No results for input(s): LIPASE, AMYLASE in the last 168 hours. No results for input(s): AMMONIA in the last 168 hours. Coagulation Profile: No results for input(s): INR, PROTIME in the last 168 hours. Cardiac Enzymes: No results for input(s): CKTOTAL, CKMB, CKMBINDEX, TROPONINI in the last 168 hours. BNP (last 3 results) No results for input(s): PROBNP in the last 8760 hours. HbA1C: No results for input(s): HGBA1C in the last 72 hours. CBG: No results for input(s): GLUCAP in the last 168 hours. Lipid Profile: No results for input(s): CHOL, HDL, LDLCALC, TRIG, CHOLHDL, LDLDIRECT in the last 72 hours. Thyroid Function Tests: No results for input(s): TSH, T4TOTAL, FREET4, T3FREE, THYROIDAB in the last 72 hours. Anemia Panel: No results for input(s): VITAMINB12, FOLATE, FERRITIN, TIBC, IRON, RETICCTPCT in the last 72 hours. Sepsis Labs: Recent Labs  Lab 09/23/18 1654  LATICACIDVEN 2.1*    Recent Results (from the past 240 hour(s))  Blood culture (routine x 2)     Status: None (Preliminary result)   Collection Time: 09/23/18  8:39 PM   Specimen: BLOOD  Result Value Ref Range Status   Specimen Description   Final    BLOOD BLOOD RIGHT HAND Performed at [facility],  [place] [address], [place], [place] [address]    Special Requests   Final    BOTTLES DRAWN AEROBIC ONLY Blood Culture results may not be optimal due to an inadequate volume of blood received in culture bottles Performed at [place] [address] [place], [place] [address]    Culture   Final    NO GROWTH < 12 HOURS Performed at [place] [address] [place], [place] [address]    Report Status PENDING  Incomplete  Blood culture (routine x 2)     Status: None (Preliminary result)   Collection Time: 09/23/18  8:39 PM   Specimen: BLOOD  Result Value Ref Range Status   Specimen Description   Final    BLOOD BLOOD RIGHT HAND Performed at [place] [address], [place], [place] [address]    Special Requests   Final    BOTTLES DRAWN AEROBIC AND ANAEROBIC Blood Culture adequate volume Performed at [facility]  [place] [address], [place], [place] [address]    Culture   Final    NO GROWTH < 12 HOURS Performed at [place] [address] [place] [address], [place], [place] [address]    Report Status PENDING  Incomplete  SARS Coronavirus 2 (CEPHEID- Performed in [place] hospital lab), Hosp Order     Status: None   Collection Time: 09/23/18  8:39 PM   Specimen: Nasopharyngeal Swab  Result Value Ref Range Status   SARS Coronavirus 2 NEGATIVE NEGATIVE Final    Comment: (NOTE) If result is NEGATIVE SARS-CoV-2 target nucleic acids are NOT DETECTED. The SARS-CoV-2 RNA is generally detectable in upper and lower  respiratory specimens during the acute phase of infection. The lowest  concentration of SARS-CoV-2 viral copies this assay can detect is 250  copies / mL. A negative result does not preclude SARS-CoV-2 infection  and should not be used as the sole basis for treatment or other  patient management decisions.  A negative result may occur with  improper specimen collection / handling, submission of specimen other   than nasopharyngeal swab, presence of viral mutation(s) within the  areas targeted by this assay, and inadequate number of viral copies  (<250 copies / mL). A negative result must be combined with clinical  observations, patient history, and epidemiological information. If result is POSITIVE SARS-CoV-2 target nucleic acids are DETECTED. The SARS-CoV-2 RNA is generally detectable in upper and lower  respiratory specimens dur ing the acute phase of infection.  Positive  results are indicative of active infection with SARS-CoV-2.  Clinical  correlation with patient history and other diagnostic information is  necessary to determine patient infection status.  Positive results do  not rule out bacterial infection or co-infection with other viruses. If result is PRESUMPTIVE POSTIVE SARS-CoV-2 nucleic acids MAY BE PRESENT.   A presumptive positive result was obtained on the submitted specimen  and confirmed on repeat testing.  While 2019 novel coronavirus  (SARS-CoV-2) nucleic acids may be present in the submitted sample  additional confirmatory testing may be necessary for epidemiological  and / or clinical management purposes  to differentiate between  SARS-CoV-2 and other Sarbecovirus currently known to infect humans.  If clinically indicated additional testing with an alternate test  methodology [phone]) is advised. The SARS-CoV-2 RNA is generally  detectable in upper and lower respiratory sp ecimens during the acute  phase of infection. The expected result is Negative. Fact Sheet for Patients:  StrictlyIdeas.no Fact Sheet for Healthcare Providers: BankingDealers.co.za This test is not yet approved or cleared by the [place] FDA and has been authorized for detection and/or diagnosis of SARS-CoV-2 by FDA under an Emergency Use Authorization (EUA).  This EUA will remain in effect (meaning this test can be used) for the duration of the  COVID-19 declaration under Section 564(b)(1) of the Act, 21 U.S.C. section 360bbb-3(b)(1), unless the authorization is terminated or revoked sooner. Performed at [facility], [place] [address], [place], [place] [address]        Radiology Studies: Ct Head Wo Contrast  Result Date: 09/23/2018 CLINICAL DATA:  85 year old male with altered mental status and weakness. Multiple myeloma. EXAM: CT HEAD WITHOUT CONTRAST TECHNIQUE: Contiguous axial images were obtained from the base of the skull through the vertex without intravenous contrast. COMPARISON:  09/07/2018 head CT, brain MRI 04/29/2018. FINDINGS: Brain: Chronic encephalomalacia in the posterior right MCA and the right PCA territories. Stable cerebral volume. Small area of cortical encephalomalacia  in the more anterior right superior frontal gyrus on series 3, image 25. No midline shift, ventriculomegaly, mass effect, evidence of mass lesion, intracranial hemorrhage or evidence of cortically based acute infarction. Vascular: Extensive Calcified atherosclerosis at the skull base. No suspicious intracranial vascular hyperdensity. Skull: Stable, no lytic osseous lesion identified. Sinuses/Orbits: Visualized paranasal sinuses and mastoids are stable and well pneumatized. Other: Stable orbit and scalp soft tissues. 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 Electronically Signed   By: Genevie Ann M.D.   On: 09/23/2018 18:31      Scheduled Meds: . enoxaparin (LOVENOX) injection  40 mg Subcutaneous QHS  . miconazole nitrate   Topical BID   Continuous Infusions: . cefTRIAXone (ROCEPHIN)  IV       LOS: 1 day    Time spent: 35 minutes   Dessa Phi, DO [facility] [email or web address] 09/24/2018, 11:03 AM

## 2018-09-24 NOTE — Evaluation (Signed)
Occupational Therapy Evaluation Patient Details Name: Martin Conner MRN: [id number] DOB: 1927/12/29 Today's Date: 09/24/2018    History of Present Illness Martin Conner is 85 year old Caucasian male with past medical history significant for multiple myeloma, kidney disease stage III, permanent atrial fibrillation, hypertension and hyperlipidemia. Pt admitted with UTI   Clinical Impression   Pt admitted with UTI/sepsis. Pt currently with functional limitations due to the deficits listed below (see OT Problem List).  Pt will benefit from skilled OT to increase their safety and independence with ADL and functional mobility for ADL to facilitate discharge to venue listed below.      Follow Up Recommendations  SNF          Precautions / Restrictions Precautions Precautions: Fall      Mobility Bed Mobility Overal bed mobility: Needs Assistance Bed Mobility: Supine to Sit;Sit to Supine     Supine to sit: Mod assist Sit to supine: Mod assist      Transfers Overall transfer level: Needs assistance Equipment used: Rolling walker (2 wheeled) Transfers: Sit to/from Stand Sit to Stand: Max assist;+2 physical assistance;+2 safety/equipment              Balance Overall balance assessment: Needs assistance Sitting-balance support: Bilateral upper extremity supported Sitting balance-Leahy Scale: Poor     Standing balance support: Bilateral upper extremity supported Standing balance-Leahy Scale: Zero                             ADL either performed or assessed with clinical judgement   ADL Overall ADL's : Needs assistance/impaired Eating/Feeding: Minimal assistance;Sitting   Grooming: Minimal assistance;Sitting   Upper Body Bathing: Minimal assistance;Sitting   Lower Body Bathing: Maximal assistance;+2 for physical assistance;+2 for safety/equipment;Cueing for safety;Sit to/from stand   Upper Body Dressing : Minimal assistance;Sitting   Lower Body Dressing: +2  for physical assistance;+2 for safety/equipment;Maximal assistance;Sit to/from stand                       Vision Baseline Vision/History: No visual deficits Patient Visual Report: No change from baseline        Extremity/Trunk Assessment Upper Extremity Assessment Upper Extremity Assessment: Generalized weakness           Communication Communication Communication: No difficulties   Cognition Arousal/Alertness: Awake/alert Behavior During Therapy: WFL for tasks assessed/performed Overall Cognitive Status: Impaired/Different from baseline Area of Impairment: Safety/judgement                         Safety/Judgement: Decreased awareness of safety                    Home Living Family/patient expects to be discharged to:: Private residence Living Arrangements: Spouse/significant other Available Help at Discharge: Family Type of Home: House       Home Layout: One level     Bathroom Shower/Tub: Occupational psychologist: Standard     Home Equipment: Environmental consultant - 2 wheels;Bedside commode          Prior Functioning/Environment Level of Independence: Needs assistance    ADL's / Homemaking Assistance Needed: spoke with pts wife, Martin Conner.  She stated pt had min A with ADL activity . Currently pt needs more A            OT Problem List: Decreased strength;Decreased activity tolerance;Impaired balance (sitting and/or standing);Decreased safety awareness  OT Treatment/Interventions: Self-care/ADL training;Therapeutic exercise;Patient/family education    OT Goals(Current goals can be found in the care plan section) Acute Rehab OT Goals Patient Stated Goal: not have a set back OT Goal Formulation: With patient Time For Goal Achievement: 10/08/18 Potential to Achieve Goals: Good  OT Frequency: Min 2X/week   Barriers to D/C:               AM-PAC OT "6 Clicks" Daily Activity     Outcome Measure Help from another person  eating meals?: A Little Help from another person taking care of personal grooming?: A Little Help from another person toileting, which includes using toliet, bedpan, or urinal?: Total Help from another person bathing (including washing, rinsing, drying)?: A Lot Help from another person to put on and taking off regular upper body clothing?: A Little Help from another person to put on and taking off regular lower body clothing?: Total 6 Click Score: 13   End of Session Equipment Utilized During Treatment: Rolling walker Nurse Communication: Mobility status  Activity Tolerance: Patient tolerated treatment well Patient left: in bed;with call bell/phone within reach;with bed alarm set  OT Visit Diagnosis: Unsteadiness on feet (R26.81);Other abnormalities of gait and mobility (R26.89);Repeated falls (R29.6);Muscle weakness (generalized) (M62.81);History of falling (Z91.81)                Time: 2761-8485 OT Time Calculation (min): 20 min Charges:  OT General Charges $OT Visit: 1 Visit OT Evaluation $OT Eval Moderate Complexity: 1 Mod  Martin Conner, [place] [phone] Office- [phone]     Martin Conner, Edwena Felty D 09/24/2018, 1:03 PM

## 2018-09-25 LAB — BASIC METABOLIC PANEL
Anion gap: 11 (ref 5–15)
BUN: 17 mg/dL (ref 8–23)
CO2: 22 mmol/L (ref 22–32)
Calcium: 9 mg/dL (ref 8.9–10.3)
Chloride: 103 mmol/L (ref 98–111)
Creatinine, Ser: 0.87 mg/dL (ref 0.61–1.24)
GFR calc Af Amer: >60 mL/min (ref >60)
GFR calc non Af Amer: >60 mL/min (ref >60)
Glucose, Bld: 78 mg/dL (ref 70–99)
Potassium: 4.3 mmol/L (ref 3.5–5.1)
Sodium: 136 mmol/L (ref 135–145)

## 2018-09-25 LAB — CBC
HCT: 30.4 % — ABNORMAL LOW (ref 39.0–52.0)
Hemoglobin: 10.5 g/dL — ABNORMAL LOW (ref 13.0–17.0)
MCH: 35 pg — ABNORMAL HIGH (ref 26.0–34.0)
MCHC: 34.5 g/dL (ref 30.0–36.0)
MCV: 101.3 fL — ABNORMAL HIGH (ref 80.0–100.0)
Platelets: 176 10*3/uL (ref 150–400)
RBC: 3 MIL/uL — ABNORMAL LOW (ref 4.22–5.81)
RDW: 15.9 % — ABNORMAL HIGH (ref 11.5–15.5)
WBC: 4.4 10*3/uL (ref 4.0–10.5)
nRBC: 0 % (ref 0.0–0.2)

## 2018-09-25 LAB — URINE CULTURE: Culture: >=100000 — AB

## 2018-09-25 NOTE — Progress Notes (Signed)
PROGRESS NOTE    Martin Conner  YTW:[id number] DOB: 1927-07-19 DOA: 09/23/2018 PCP: Venia Carbon, MD     Brief Narrative:  Martin Conner is 85 year old Caucasian male with past medical history significant for multiple myeloma, kidney disease stage III, permanent atrial fibrillation, hypertension and hyperlipidemia.  Patient is currently on break from chemotherapy for the multiple myeloma as per the ER provider.  Patient has indwelling Foley catheter, and has had such for about 2 weeks.  Patient could not give any significant history due to confusion.  Apparently, as per collateral information, patient was seen earlier today at the urologist office, found to be more confused with blood pressure of 70/40 that responded to fluid resuscitation.  Patient was also feeling weak.  Patient was transported to the hospital for further assessment and management. Hospitalist service was asked to admit patient for further assessment and management of complicated UTI with possible early sepsis.  New events last 24 hours / Subjective: No new issues overnight. Pleasantly confused.   Assessment & Plan:   Principal Problem:   Complicated UTI (urinary tract infection) Active Problems:   Dementia (HCC)   Multiple myeloma (HCC)   Acute metabolic encephalopathy    Acute encephalopathy, likely combined metabolic and toxic, possibly secondary to complicated UTI/early sepsis -Patient has underlying dementia -CT head negative for acute intracranial pathology -Monitor, delirium precaution -Mentation seems back to his baseline  Complicated UTI, present on admission, related with chronic Foley catheter use -Sepsis ruled out -Urine culture showed >100,000 GPC, identification and sensitivity pending  -Blood culture pending -Continue empiric Rocephin  AKI -Creatinine improved to 0.87   History of multiple myeloma -Follow as outpatient  Weakness -OT recommending SNF     DVT prophylaxis: Lovenox Code  Status: Full Family Communication: Spoke with daughter over the phone Disposition Plan: Pending further treatment of UTI, culture pending. SNF placement pending, daughter is agreeable to placement    Consultants:   None  Procedures:   None   Antimicrobials:  Anti-infectives (From admission, onward)   Start     Dose/Rate Route Frequency Ordered Stop   09/24/18 1800  cefTRIAXone (ROCEPHIN) 1 g in sodium chloride 0.9 % 100 mL IVPB     1 g 200 mL/hr over 30 Minutes Intravenous Every 24 hours 09/23/18 2256     09/23/18 1830  cefTRIAXone (ROCEPHIN) 1 g in sodium chloride 0.9 % 100 mL IVPB     1 g 200 mL/hr over 30 Minutes Intravenous  Once 09/23/18 1821 09/23/18 2039       Objective: Vitals:   09/24/18 0511 09/24/18 1526 09/24/18 1951 09/25/18 0548  BP: (!) 155/93 (!) 150/87 124/67 123/75  Pulse: 72 74 81 77  Resp: _0 Temp: 98.2 F (36.8 C) (!) 97.5 F (36.4 C) 97.9 F (36.6 C) 97.9 F (36.6 C)  TempSrc: Oral Oral Oral Oral  SpO2: 100% 100% 99% 95%    Intake/Output Summary (Last 24 hours) at 09/25/2018 1052 Last data filed at 09/25/2018 0551 Gross per 24 hour  Intake 475 ml  Output 700 ml  Net -225 ml   There were no vitals filed for this visit.  Examination: General exam: Appears calm and comfortable  Respiratory system: Clear to auscultation. Respiratory effort normal. Cardiovascular system: S1 & S2 heard, RRR. No JVD, murmurs, rubs, gallops or clicks. No pedal edema. Gastrointestinal system: Abdomen is nondistended, soft and nontender. No organomegaly or masses felt. Normal bowel sounds heard. Central nervous system: Alert to  voice. Extremities: Symmetric 5 x 5 power. Skin: No rashes, lesions or ulcers Psychiatry: Dementia   Data Reviewed: I have personally reviewed following labs and imaging studies  CBC: Recent Labs  Lab 09/23/18 1654 09/24/18 0201 09/25/18 0537  WBC 8.0 7.5 4.4  NEUTROABS 6.3  --   --   HGB 12.1* 10.8* 10.5*  HCT 36.6*  32.2* 30.4*  MCV 101.1* 101.6* 101.3*  PLT 219 176 390   Basic Metabolic Panel: Recent Labs  Lab 09/23/18 1654 09/24/18 0201 09/25/18 0537  NA 136 137 136  K 4.9 5.1 4.3  CL 101 104 103  CO2 _0 GLUCOSE 120* 90 78  BUN 26* 23 17  CREATININE 1.39* 1.06  1.06 0.87  CALCIUM 9.6 9.3 9.0  MG  --  2.0  --   PHOS  --  3.7  --    GFR: Estimated Creatinine Clearance: 58.7 mL/min (by C-G formula based on SCr of 0.87 mg/dL). Liver Function Tests: Recent Labs  Lab 09/23/18 1654  AST 18  ALT 15  ALKPHOS 46  BILITOT 0.9  PROT 8.5*  ALBUMIN 3.1*   No results for input(s): LIPASE, AMYLASE in the last 168 hours. No results for input(s): AMMONIA in the last 168 hours. Coagulation Profile: No results for input(s): INR, PROTIME in the last 168 hours. Cardiac Enzymes: No results for input(s): CKTOTAL, CKMB, CKMBINDEX, TROPONINI in the last 168 hours. BNP (last 3 results) No results for input(s): PROBNP in the last 8760 hours. HbA1C: No results for input(s): HGBA1C in the last 72 hours. CBG: No results for input(s): GLUCAP in the last 168 hours. Lipid Profile: No results for input(s): CHOL, HDL, LDLCALC, TRIG, CHOLHDL, LDLDIRECT in the last 72 hours. Thyroid Function Tests: No results for input(s): TSH, T4TOTAL, FREET4, T3FREE, THYROIDAB in the last 72 hours. Anemia Panel: No results for input(s): VITAMINB12, FOLATE, FERRITIN, TIBC, IRON, RETICCTPCT in the last 72 hours. Sepsis Labs: Recent Labs  Lab 09/23/18 1654  LATICACIDVEN 2.1*    Recent Results (from the past 240 hour(s))  Urine culture     Status: Abnormal (Preliminary result)   Collection Time: 09/23/18  6:02 PM   Specimen: Urine, Random  Result Value Ref Range Status   Specimen Description   Final    URINE, RANDOM Performed at [facility] [address], [place], [place] [address]    Special Requests   Final    NONE Performed at [facility], [place] [address],  [place], [place] [address]    Culture (A)  Final    >=100,000 COLONIES/mL GRAM POSITIVE COCCI IDENTIFICATION AND SUSCEPTIBILITIES TO FOLLOW Performed at [facility], [place] [address], [place], [place] [address]    Report Status PENDING  Incomplete  Blood culture (routine x 2)     Status: None (Preliminary result)   Collection Time: 09/23/18  8:39 PM   Specimen: BLOOD  Result Value Ref Range Status   Specimen Description   Final    BLOOD BLOOD RIGHT HAND Performed at [facility] [address], [place], [place] [address]    Special Requests   Final    BOTTLES DRAWN AEROBIC ONLY Blood Culture results may not be optimal due to an inadequate volume of blood received in culture bottles Performed at [facility] [address], [place], [place] [address]    Culture   Final    NO GROWTH < 12 HOURS Performed at [facility]  Lab, [address], [place], [place] [address]    Report Status PENDING  Incomplete  Blood culture (routine x 2)     Status: None (Preliminary result)   Collection Time: 09/23/18  8:39 PM   Specimen: BLOOD  Result Value Ref Range Status   Specimen Description   Final    BLOOD BLOOD RIGHT HAND Performed at [facility] [address] [place], [place] [address]    Special Requests   Final    BOTTLES DRAWN AEROBIC AND ANAEROBIC Blood Culture adequate volume Performed at [facility] [address] [place], [place] [address]    Culture   Final    NO GROWTH < 12 HOURS Performed at [facility] [address] [place],  [address]    Report Status PENDING  Incomplete  SARS Coronavirus 2 (CEPHEID- Performed in [facility] hospital lab), Hosp Order     Status: None   Collection Time: 09/23/18  8:39 PM   Specimen: Nasopharyngeal Swab  Result Value Ref Range Status   SARS Coronavirus 2 NEGATIVE NEGATIVE Final    Comment: (NOTE) If result is NEGATIVE SARS-CoV-2 target  nucleic acids are NOT DETECTED. The SARS-CoV-2 RNA is generally detectable in upper and lower  respiratory specimens during the acute phase of infection. The lowest  concentration of SARS-CoV-2 viral copies this assay can detect is 250  copies / mL. A negative result does not preclude SARS-CoV-2 infection  and should not be used as the sole basis for treatment or other  patient management decisions.  A negative result may occur with  improper specimen collection / handling, submission of specimen other  than nasopharyngeal swab, presence of viral mutation(s) within the  areas targeted by this assay, and inadequate number of viral copies  (<250 copies / mL). A negative result must be combined with clinical  observations, patient history, and epidemiological information. If result is POSITIVE SARS-CoV-2 target nucleic acids are DETECTED. The SARS-CoV-2 RNA is generally detectable in upper and lower  respiratory specimens dur ing the acute phase of infection.  Positive  results are indicative of active infection with SARS-CoV-2.  Clinical  correlation with patient history and other diagnostic information is  necessary to determine patient infection status.  Positive results do  not rule out bacterial infection or co-infection with other viruses. If result is PRESUMPTIVE POSTIVE SARS-CoV-2 nucleic acids MAY BE PRESENT.   A presumptive positive result was obtained on the submitted specimen  and confirmed on repeat testing.  While 2019 novel coronavirus  (SARS-CoV-2) nucleic acids may be present in the submitted sample  additional confirmatory testing may be necessary for epidemiological  and / or clinical management purposes  to differentiate between  SARS-CoV-2 and other Sarbecovirus currently known to infect humans.  If clinically indicated additional testing with an alternate test  methodology [phone]) is advised. The SARS-CoV-2 RNA is generally  detectable in upper and lower  respiratory sp ecimens during the acute  phase of infection. The expected result is Negative. Fact Sheet for Patients:  StrictlyIdeas.no Fact Sheet for Healthcare Providers: BankingDealers.co.za This test is not yet approved or cleared by the [place] FDA and has been authorized for detection and/or diagnosis of SARS-CoV-2 by FDA under an Emergency Use Authorization (EUA).  This EUA will remain in effect (meaning this test can be used) for the duration of the COVID-19 declaration under Section 564(b)(1) of the Act, 21 U.S.C. section 360bbb-3(b)(1), unless the authorization is terminated or  revoked sooner. Performed at [facility], [place] [address] [place],  [address]        Radiology Studies: Ct Head Wo Contrast  Result Date: 09/23/2018 CLINICAL DATA:  85 year old male with altered mental status and weakness. Multiple myeloma. EXAM: CT HEAD WITHOUT CONTRAST TECHNIQUE: Contiguous axial images were obtained from the base of the skull through the vertex without intravenous contrast. COMPARISON:  09/07/2018 head CT, brain MRI 04/29/2018. FINDINGS: Brain: Chronic encephalomalacia in the posterior right MCA and the right PCA territories. Stable cerebral volume. Small area of cortical encephalomalacia in the more anterior right superior frontal gyrus on series 3, image 25. No midline shift, ventriculomegaly, mass effect, evidence of mass lesion, intracranial hemorrhage or evidence of cortically based acute infarction. Vascular: Extensive Calcified atherosclerosis at the skull base. No suspicious intracranial vascular hyperdensity. Skull: Stable, no lytic osseous lesion identified. Sinuses/Orbits: Visualized paranasal sinuses and mastoids are stable and well pneumatized. Other: Stable orbit and scalp soft tissues. 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 Electronically Signed   By: Genevie Ann M.D.   On: 09/23/2018 18:31      Scheduled Meds: . enoxaparin (LOVENOX) injection  40 mg Subcutaneous QHS  . miconazole nitrate   Topical BID   Continuous Infusions: . cefTRIAXone (ROCEPHIN)  IV 1 g (09/24/18 1814)     LOS: 2 days    Time spent: 25 minutes   Dessa Phi, DO [facility] [email or web address] 09/25/2018, 10:52 AM

## 2018-09-25 NOTE — Evaluation (Signed)
Physical Therapy Evaluation Patient Details Name: Martin Conner MRN: [id number] DOB: [birth year] Today's Date: 09/25/2018   History of Present Illness  Martin Conner is 85 year old Caucasian male with past medical history significant for multiple myeloma, kidney disease stage III, permanent atrial fibrillation, hypertension and hyperlipidemia. Pt admitted with UTI/sepsis  Clinical Impression  Pt admitted as above and presenting with functional mobility limitations 2* generalized weakness, balance deficits and questionable safety awareness.  Pt hopes to progress to dc home with family assist.    Follow Up Recommendations Home health PT    Equipment Recommendations  None recommended by PT    Recommendations for Other Services       Precautions / Restrictions Precautions Precautions: Fall Restrictions Weight Bearing Restrictions: No      Mobility  Bed Mobility Overal bed mobility: Needs Assistance Bed Mobility: Supine to Sit     Supine to sit: Min assist     General bed mobility comments: Increased time with min assist to complete transition to EOB  Transfers Overall transfer level: Needs assistance Equipment used: Rolling walker (2 wheeled) Transfers: Sit to/from Stand Sit to Stand: Min assist;From elevated surface         General transfer comment: cues for safety and use of UEs to self assist.  Min physical assist to bring wt up and fwd and balance in initial standing  Ambulation/Gait Ambulation/Gait assistance: Min assist;Min guard Gait Distance (Feet): 75 Feet Assistive device: Rolling walker (2 wheeled) Gait Pattern/deviations: Step-to pattern;Step-through pattern;Decreased step length - right;Decreased step length - left;Shuffle;Trunk flexed Gait velocity: decr   General Gait Details: cues for posture, position from RW, and safety awareness  Stairs            Wheelchair Mobility    Modified Rankin (Stroke Patients Only)       Balance Overall balance  assessment: Needs assistance Sitting-balance support: No upper extremity supported;Feet supported Sitting balance-Leahy Scale: Good     Standing balance support: Bilateral upper extremity supported Standing balance-Leahy Scale: Poor                               Pertinent Vitals/Pain      Home Living Family/patient expects to be discharged to:: Private residence Living Arrangements: Spouse/significant other Available Help at Discharge: Family Type of Home: House Home Access: Stairs to enter Entrance Stairs-Rails: None Entrance Stairs-Number of Steps: 2 Home Layout: One level Home Equipment: Environmental consultant - 2 wheels;Bedside commode Additional Comments: Pt poor historian, some details of home situation taken from previous notes    Prior Function Level of Independence: Needs assistance      ADL's / Homemaking Assistance Needed: OT spoke with pt's wife who reports pt requiring min assist with basic ADL        Hand Dominance   Dominant Hand: Right    Extremity/Trunk Assessment   Upper Extremity Assessment Upper Extremity Assessment: Generalized weakness    Lower Extremity Assessment Lower Extremity Assessment: Generalized weakness       Communication   Communication: No difficulties  Cognition Arousal/Alertness: Awake/alert Behavior During Therapy: WFL for tasks assessed/performed Overall Cognitive Status: No family/caregiver present to determine baseline cognitive functioning Area of Impairment: Safety/judgement;Problem solving                         Safety/Judgement: Decreased awareness of safety   Problem Solving: Slow processing General Comments: Pt initially following cues and conversing  on current pandemic but after standing becoming easily confused with simple cues - RN advises pt with fluctuating cognition as well      General Comments      Exercises     Assessment/Plan    PT Assessment Patient needs continued PT services  PT  Problem List Decreased strength;Decreased range of motion;Decreased activity tolerance;Decreased mobility;Decreased knowledge of use of DME;Decreased safety awareness;Decreased cognition;Decreased balance       PT Treatment Interventions DME instruction;Gait training;Stair training;Functional mobility training;Therapeutic activities;Therapeutic exercise;Balance training;Patient/family education    PT Goals (Current goals can be found in the Care Plan section)  Acute Rehab PT Goals Patient Stated Goal: HOME PT Goal Formulation: With patient Time For Goal Achievement: 10/09/18 Potential to Achieve Goals: Fair    Frequency Min 3X/week   Barriers to discharge        Co-evaluation               AM-PAC PT "6 Clicks" Mobility  Outcome Measure Help needed turning from your back to your side while in a flat bed without using bedrails?: None Help needed moving from lying on your back to sitting on the side of a flat bed without using bedrails?: A Little Help needed moving to and from a bed to a chair (including a wheelchair)?: A Little Help needed standing up from a chair using your arms (e.g., wheelchair or bedside chair)?: A Little Help needed to walk in hospital room?: A Little Help needed climbing 3-5 steps with a railing? : A Little 6 Click Score: 19    End of Session Equipment Utilized During Treatment: Gait belt Activity Tolerance: Patient tolerated treatment well Patient left: in chair;with call bell/phone within reach;with chair alarm set Nurse Communication: Mobility status PT Visit Diagnosis: Difficulty in walking, not elsewhere classified (R26.2);Muscle weakness (generalized) (M62.81)    Time: 1004-1020 PT Time Calculation (min) (ACUTE ONLY): 16 min   Charges:   PT Evaluation $PT Eval Low Complexity: 1 Low          [place] Pager [phone] Office [phone]   Martin Conner 09/25/2018, 11:58 AM

## 2018-09-26 MED ORDER — DOXYCYCLINE HYCLATE 100 MG PO TABS: 100.0000 mg | ORAL_TABLET | Freq: Two times a day (BID) | ORAL | 0 refills | Status: AC

## 2018-09-26 MED ORDER — DOXYCYCLINE HYCLATE 100 MG PO TABS
100.0000 mg | ORAL_TABLET | Freq: Two times a day (BID) | ORAL | Status: DC
  Administered 2018-09-26: 100 mg via ORAL
  Filled 2018-09-26: qty 1

## 2018-09-26 MED ORDER — LOSARTAN POTASSIUM 50 MG PO TABS
50.0000 mg | ORAL_TABLET | Freq: Two times a day (BID) | ORAL | Status: DC
  Administered 2018-09-26: 50 mg via ORAL
  Filled 2018-09-26: qty 1

## 2018-09-26 NOTE — Progress Notes (Signed)
Physical Therapy Treatment Patient Details Name: Martin Conner MRN: [id number] DOB: 1928/01/08 Today's Date: 09/26/2018    History of Present Illness Martin Conner is 85 year old Caucasian male with past medical history significant for multiple myeloma, kidney disease stage III, permanent atrial fibrillation, hypertension and hyperlipidemia. Pt admitted with UTI/sepsis    PT Comments    Pt progressing steadily with mobility with noted improvement in stability and activity tolerance.  Pt hopeful for dc home this date.   Follow Up Recommendations  Home health PT     Equipment Recommendations  None recommended by PT    Recommendations for Other Services       Precautions / Restrictions Precautions Precautions: Fall Restrictions Weight Bearing Restrictions: No    Mobility  Bed Mobility Overal bed mobility: Needs Assistance Bed Mobility: Supine to Sit     Supine to sit: Supervision     General bed mobility comments: Increased time but no physical assist  Transfers Overall transfer level: Needs assistance Equipment used: Rolling walker (2 wheeled) Transfers: Sit to/from Stand Sit to Stand: Min guard         General transfer comment: cues for safety and use of UEs to self assist  Ambulation/Gait Ambulation/Gait assistance: Min guard;Supervision Gait Distance (Feet): 175 Feet Assistive device: Rolling walker (2 wheeled) Gait Pattern/deviations: Step-to pattern;Step-through pattern;Decreased step length - right;Decreased step length - left;Shuffle;Trunk flexed Gait velocity: decr   General Gait Details: cues for posture, position from RW, and safety awareness   Stairs             Wheelchair Mobility    Modified Rankin (Stroke Patients Only)       Balance Overall balance assessment: Needs assistance Sitting-balance support: No upper extremity supported;Feet supported Sitting balance-Leahy Scale: Good     Standing balance support: No upper extremity  supported Standing balance-Leahy Scale: Fair                              Cognition Arousal/Alertness: Awake/alert Behavior During Therapy: WFL for tasks assessed/performed Overall Cognitive Status: No family/caregiver present to determine baseline cognitive functioning Area of Impairment: Safety/judgement;Problem solving                         Safety/Judgement: Decreased awareness of safety   Problem Solving: Slow processing General Comments: Pt conversing normally about Dr being in and going home but then looked at breakfast tray and asked "am I supposed to eat this?"      Exercises      General Comments        Pertinent Vitals/Pain      Home Living                      Prior Function            PT Goals (current goals can now be found in the care plan section) Acute Rehab PT Goals Patient Stated Goal: HOME PT Goal Formulation: With patient Time For Goal Achievement: 10/09/18 Potential to Achieve Goals: Fair Progress towards PT goals: Progressing toward goals    Frequency    Min 3X/week      PT Plan Current plan remains appropriate    Co-evaluation              AM-PAC PT "6 Clicks" Mobility   Outcome Measure  Help needed turning from your back to your side while in a  flat bed without using bedrails?: None Help needed moving from lying on your back to sitting on the side of a flat bed without using bedrails?: None Help needed moving to and from a bed to a chair (including a wheelchair)?: A Little Help needed standing up from a chair using your arms (e.g., wheelchair or bedside chair)?: A Little Help needed to walk in hospital room?: A Little Help needed climbing 3-5 steps with a railing? : A Little 6 Click Score: 20    End of Session Equipment Utilized During Treatment: Gait belt Activity Tolerance: Patient tolerated treatment well Patient left: in chair;with call bell/phone within reach;with chair alarm  set Nurse Communication: Mobility status PT Visit Diagnosis: Difficulty in walking, not elsewhere classified (R26.2);Muscle weakness (generalized) (M62.81)     Time: 0712-1975 PT Time Calculation (min) (ACUTE ONLY): 19 min  Charges:  $Gait Training: 8-22 mins                        Shanetra Blumenstock 09/26/2018, 12:41 PM

## 2018-09-26 NOTE — Progress Notes (Signed)
    Home health agencies that serve [phone].        [place] Quality of Patient Care Rating Patient Survey Summary Rating  [facility] [phone] 3 out of 5 stars 5 out of [place] [phone] 3 out of 5 stars 4 out of [place] [phone] 4  out of 5 stars 3 out of [place] [phone] 4 out of 5 stars 4 out of [place] [phone] 4 out of 5 stars 4 out of 5 stars  [facility] [phone] 3  out of 5 stars 4 out of [place] [phone] 3 out of 5 stars 4 out of [place] [phone] 3 out of 5 stars 4 out of [place] [phone] 4  out of 5 stars 3 out of [place] number Footnote as displayed on [place]  1 This agency provides services under a federal waiver program to non-traditional, chronic long term population.  2 This agency provides services to a special needs population.  3 Not Available.  4 The number of patient episodes for this measure is too small to report.  5 This measure currently does not have data or provider has been certified/recertified for less than 6 months.  6 The national average for this measure is not provided because of state-to-state differences in data collection.  7 Medicare is not displaying rates for this measure for any home health agency, because of an issue with the data.  8 There were problems with the data and they are being corrected.  9 Zero, or very few, patients met the survey's rules for inclusion. The scores shown, if any, reflect a very small number of surveys and may not accurately tell how an agency is doing.  10 Survey results are based on less than 12 months of  data.  11 Fewer than 70 patients completed the survey. Use the scores shown, if any, with caution as the number of surveys may be too low to accurately tell how an agency is doing.  12 No survey results are available for this period.  13 Data suppressed by CMS for one or more quarters.

## 2018-09-26 NOTE — TOC Initial Note (Signed)
Transition of Care [facility]) - Initial/Assessment Note    Patient Details  Name: Martin Conner MRN: [id number] Date of Birth: [birth year]  Transition of Care (TOC) CM/SW Contact:    Joaquin Courts, RN Phone Number: 09/26/2018, 12:39 PM  Clinical Narrative:      CM spoke with patient at bedside who requests CM communicate with his spouse and daughter. CM called spouse and daughter who report that patient is active with [place].  [facility] rep contacted and confirmed. [facility] to continue pt services at d/c. Pt set up with HHRN, PT, OT.               Expected Discharge Plan: [place] Barriers to Discharge: No Barriers Identified   Patient Goals and CMS Choice Patient states their goals for this hospitalization and ongoing recovery are:: to go home CMS Medicare.gov Compare Post Acute Care list provided to:: Patient Choice offered to / list presented to : Patient  Expected Discharge Plan and Services Expected Discharge Plan: [place]   Discharge Planning Services: CM Consult Post Acute Care Choice: [place] arrangements for the past 2 months: Single Family Home Expected Discharge Date: 09/26/18               DME Arranged: N/A DME Agency: NA       HH Arranged: PT, RN, OT HH Agency: [facility] Agency Contacted: 09/26/18 Time HH Agency Contacted: 1238 Representative spoke with at [place]: Tommi Rumps  Prior Living Arrangements/Services Living arrangements for the past 2 months: [place] with:: Spouse, Adult Children Patient language and need for interpreter reviewed:: Yes Do you feel safe going back to the place where you live?: Yes      Need for Family Participation in Patient Care: Yes (Comment) Care giver support system in place?: Yes (comment)   Criminal Activity/Legal Involvement Pertinent to Current Situation/Hospitalization: No - Comment as needed  Activities of Daily Living      Permission  Sought/Granted   Permission granted to share information with : Yes, Verbal Permission Granted  Share Information with NAME: [name] and Ria Comment     Permission granted to share info w Relationship: spouse and daughter     Emotional Assessment Appearance:: Appears stated age Attitude/Demeanor/Rapport: Engaged Affect (typically observed): Accepting Orientation: : Oriented to Place, Oriented to  Time, Oriented to Situation, Oriented to Self   Psych Involvement: No (comment)  Admission diagnosis:  Lower urinary tract infectious disease [N39.0] Altered mental status, unspecified altered mental status type [R41.82] Patient Active Problem List   Diagnosis Date Noted  . Acute metabolic encephalopathy 88/91/6945  . Complicated UTI (urinary tract infection) 09/23/2018  . Multiple myeloma ([place]) 07/19/2018  . Counseling regarding advance care planning and goals of care 07/19/2018  . MGUS (monoclonal gammopathy of unknown significance) 05/17/2018  . Dementia ([place]) 04/20/2018  . Memory loss 04/17/2017  . S/P total hip arthroplasty 07/30/2015  . Complete heart block ([place]) 05/13/2015  . Hypertensive heart disease   . Chronic atrial fibrillation   . Hyperlipidemia   . Advanced directives, counseling/discussion 02/03/2014  . Routine general medical examination at a health care facility 01/30/2012  . MITRAL REGURGITATION 06/05/2009  . Osteoarthrosis, generalized, involving multiple sites 01/20/2008  . DEGENERATIVE DISC DISEASE, LUMBAR SPINE 08/09/2007  . Essential hypertension, benign 05/26/2007   PCP:  Venia Carbon, MD Pharmacy:   [facility] [place]  [place] [address] Phone: [phone] Fax: [phone]  [facility] [address] [place]), [place] [address] [place] [address] Phone: [phone] Fax: [phone]     Social Determinants of Health (SDOH) Interventions     Readmission Risk Interventions No flowsheet data found.

## 2018-09-26 NOTE — Discharge Summary (Signed)
Physician Discharge Summary  Martin Conner PIR:[id number] DOB: 07-Apr-1927 DOA: 09/23/2018  PCP: Venia Carbon, MD  Admit date: 09/23/2018 Discharge date: 09/26/2018  Admitted From: Home Disposition:  Home  Recommendations for Outpatient Follow-up:  1. Follow up with PCP in 1 week 2. Follow up with Urology Dr. Gloriann Loan  3. Please follow up on the following pending results: Final blood culture result, negative at day of discharge  Home Health: PT OT RN Equipment/Devices: None    Discharge Condition: Stable CODE STATUS: Full  Diet recommendation: Heart healthy  Brief/Interim Summary: Martin Conner is 85 year old Caucasian male with past medical history significant for multiple myeloma, kidney disease stage III, permanent atrial fibrillation, hypertension and hyperlipidemia. Patient is currently on break from chemotherapy for the multiple myeloma as per the ER provider. Patient has indwelling Foley catheter, and has had such for about 2 weeks. Patient could not give any significant history due to confusion. Apparently, as per collateral information, patient was seen earlier today at the urologist office, found to be more confused with blood pressure of 70/40 that responded to fluid resuscitation. Patient was also feeling weak. Patient was transported to the hospital for further assessment and management. Hospitalist service was asked to admit patient for further assessment and management of complicated UTI with possible early sepsis.   Discharge Diagnoses:  Principal Problem:   Complicated UTI (urinary tract infection) Active Problems:   Dementia (HCC)   Multiple myeloma (HCC)   Acute metabolic encephalopathy   Acute encephalopathy, likely combined metabolic and toxic, possibly secondary to complicated UTI/early sepsis -Patient has underlying dementia -CT head negative for acute intracranial pathology -Monitor, delirium precaution -Mentation seems back to his baseline  Complicated UTI,  present on admission, related with chronic Foley catheter use -Sepsis ruled out -Urine culture showed coag negative staph -Sensitive to doxycycline, continue to finish treatment for 5 more days  AKI -Creatinine improved to 0.87  -Resume cozaar   History of multiple myeloma -Follow as outpatient  Weakness -PT eval and patient has progressed and will go home with home health service    Discharge Instructions  Discharge Instructions    Call MD for:  difficulty breathing, headache or visual disturbances   Complete by: As directed    Call MD for:  extreme fatigue   Complete by: As directed    Call MD for:  hives   Complete by: As directed    Call MD for:  persistant dizziness or light-headedness   Complete by: As directed    Call MD for:  persistant nausea and vomiting   Complete by: As directed    Call MD for:  severe uncontrolled pain   Complete by: As directed    Call MD for:  temperature >100.4   Complete by: As directed    Diet - low sodium heart healthy   Complete by: As directed    Discharge instructions   Complete by: As directed    You were cared for by a hospitalist during your hospital stay. If you have any questions about your discharge medications or the care you received while you were in the hospital after you are discharged, you can call the unit and ask to speak with the hospitalist on call if the hospitalist that took care of you is not available. Once you are discharged, your primary care physician will handle any further medical issues. Please note that NO REFILLS for any discharge medications will be authorized once you are discharged, as it is  imperative that you return to your primary care physician (or establish a relationship with a primary care physician if you do not have one) for your aftercare needs so that they can reassess your need for medications and monitor your lab values.   Increase activity slowly   Complete by: As directed      Allergies  as of 09/26/2018   No Known Allergies     Medication List    STOP taking these medications   acyclovir 400 MG tablet Commonly known as: ZOVIRAX   apixaban 2.5 MG Tabs tablet Commonly known as: ELIQUIS     TAKE these medications   doxycycline 100 MG tablet Commonly known as: VIBRA-TABS Take 1 tablet (100 mg total) by mouth every 12 (twelve) hours for 5 days.   losartan 50 MG tablet Commonly known as: COZAAR TAKE 1 TABLET TWICE A DAY What changed: when to take this   ondansetron 8 MG tablet Commonly known as: Zofran Take 1 tablet (8 mg total) by mouth 2 (two) times daily as needed (Nausea or vomiting).   sennosides-docusate sodium 8.6-50 MG tablet Commonly known as: SENOKOT-S Take 2 tablets by mouth daily. What changed:   when to take this  reasons to take this   tamsulosin 0.4 MG Caps capsule Commonly known as: Flomax Take 1 capsule (0.4 mg total) by mouth daily after supper.   TYLENOL PO Take 500 mg by mouth daily as needed (pain). Pt unsure of strength      Follow-up Information    Viviana Simpler I, MD. Schedule an appointment as soon as possible for a visit in 1 week(s).   Specialties: Internal Medicine, Pediatrics Contact information: [place] [address] [phone]        Lucas Mallow, MD Follow up.   Specialty: Urology Contact information: [place] [address] [phone]          No Known Allergies  Consultations:  None    Procedures/Studies:  Ct Head Wo Contrast  Result Date: 09/23/2018 CLINICAL DATA:  85 year old male with altered mental status and weakness. Multiple myeloma. EXAM: CT HEAD WITHOUT CONTRAST TECHNIQUE: Contiguous axial images were obtained from the base of the skull through the vertex without intravenous contrast. COMPARISON:  09/07/2018 head CT, brain MRI 04/29/2018. FINDINGS: Brain: Chronic encephalomalacia in the posterior right MCA and the right PCA territories.  Stable cerebral volume. Small area of cortical encephalomalacia in the more anterior right superior frontal gyrus on series 3, image 25. No midline shift, ventriculomegaly, mass effect, evidence of mass lesion, intracranial hemorrhage or evidence of cortically based acute infarction. Vascular: Extensive Calcified atherosclerosis at the skull base. No suspicious intracranial vascular hyperdensity. Skull: Stable, no lytic osseous lesion identified. Sinuses/Orbits: Visualized paranasal sinuses and mastoids are stable and well pneumatized. Other: Stable orbit and scalp soft tissues. 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 Electronically Signed   By: Genevie Ann M.D.   On: 09/23/2018 18:31     Discharge Exam: Vitals:   09/25/18 2208 09/26/18 0505  BP: 139/80 (!) 145/91  Pulse: 71 81  Resp: 18 20  Temp: 98 F (36.7 C) (!) 97.4 F (36.3 C)  SpO2: 99% 99%     General: Pt is alert, awake, not in acute distress Cardiovascular: RRR, S1/S2 +, no rubs, no gallops Respiratory: CTA bilaterally, no wheezing, no rhonchi Abdominal: Soft, NT, ND, bowel sounds + Extremities:  no edema, no cyanosis    The results of significant diagnostics from this hospitalization (including imaging, microbiology, ancillary and laboratory) are listed below for reference.     Microbiology: Recent Results (from the past 240 hour(s))  Urine culture     Status: Abnormal   Collection Time: 09/23/18  6:02 PM   Specimen: Urine, Random  Result Value Ref Range Status   Specimen Description URINE, RANDOM  Final   Special Requests NONE  Final   Culture (A)  Final    >=100,000 COLONIES/mL STAPHYLOCOCCUS SPECIES (COAGULASE NEGATIVE)   Report Status 09/25/2018 FINAL  Final   Organism ID, Bacteria STAPHYLOCOCCUS SPECIES (COAGULASE NEGATIVE) (A)  Final      Susceptibility   Staphylococcus species (coagulase negative) - MIC*    CIPROFLOXACIN  INTERMEDIATE Intermediate     GENTAMICIN <=0.5 SENSITIVE Sensitive     NITROFURANTOIN 32 SENSITIVE Sensitive     OXACILLIN SENSITIVE Sensitive     TETRACYCLINE <=1 SENSITIVE Sensitive     VANCOMYCIN 1 SENSITIVE Sensitive     TRIMETH/SULFA <=10 SENSITIVE Sensitive     CLINDAMYCIN <=0.25 SENSITIVE Sensitive     RIFAMPIN <=0.5 SENSITIVE Sensitive     Inducible Clindamycin NEGATIVE Sensitive     * >=100,000 COLONIES/mL STAPHYLOCOCCUS SPECIES (COAGULASE NEGATIVE)  Blood culture (routine x 2)     Status: None (Preliminary result)   Collection Time: 09/23/18  8:39 PM   Specimen: BLOOD  Result Value Ref Range Status   Specimen Description   Final    BLOOD BLOOD RIGHT HAND Performed at [name], [address], [place], [place] [address]    Special Requests   Final    BOTTLES DRAWN AEROBIC ONLY Blood Culture results may not be optimal due to an inadequate volume of blood received in culture bottles Performed at [facility] [address], [place], [place] [address]    Culture   Final    NO GROWTH 2 DAYS Performed at [facility] [address], [place], [place] [address]    Report Status PENDING  Incomplete  Blood culture (routine x 2)     Status: None (Preliminary result)   Collection Time: 09/23/18  8:39 PM   Specimen: BLOOD  Result Value Ref Range Status   Specimen Description   Final    BLOOD BLOOD RIGHT HAND Performed at [facility] [address], [place], [place] [address]    Special Requests   Final    BOTTLES DRAWN AEROBIC AND ANAEROBIC Blood Culture adequate volume Performed at [facility] [address], [place], [place] [address]    Culture   Final    NO GROWTH 2 DAYS Performed at [facility] [address], [place],  [address]    Report Status PENDING  Incomplete  SARS Coronavirus 2 (CEPHEID- Performed in [facility] hospital lab), Hosp Order     Status: None    Collection Time: 09/23/18  8:39 PM   Specimen: Nasopharyngeal Swab  Result Value Ref Range Status   SARS Coronavirus 2 NEGATIVE NEGATIVE Final    Comment: (NOTE) If result is NEGATIVE SARS-CoV-2 target nucleic acids are NOT DETECTED. The SARS-CoV-2 RNA is generally detectable in upper and lower  respiratory specimens during the acute phase of infection. The lowest  concentration of SARS-CoV-2 viral copies this assay can detect is 250  copies / mL. A negative result does not preclude SARS-CoV-2 infection  and should not be used  as the sole basis for treatment or other  patient management decisions.  A negative result may occur with  improper specimen collection / handling, submission of specimen other  than nasopharyngeal swab, presence of viral mutation(s) within the  areas targeted by this assay, and inadequate number of viral copies  (<250 copies / mL). A negative result must be combined with clinical  observations, patient history, and epidemiological information. If result is POSITIVE SARS-CoV-2 target nucleic acids are DETECTED. The SARS-CoV-2 RNA is generally detectable in upper and lower  respiratory specimens dur ing the acute phase of infection.  Positive  results are indicative of active infection with SARS-CoV-2.  Clinical  correlation with patient history and other diagnostic information is  necessary to determine patient infection status.  Positive results do  not rule out bacterial infection or co-infection with other viruses. If result is PRESUMPTIVE POSTIVE SARS-CoV-2 nucleic acids MAY BE PRESENT.   A presumptive positive result was obtained on the submitted specimen  and confirmed on repeat testing.  While 2019 novel coronavirus  (SARS-CoV-2) nucleic acids may be present in the submitted sample  additional confirmatory testing may be necessary for epidemiological  and / or clinical management purposes  to differentiate between  SARS-CoV-2 and other Sarbecovirus  currently known to infect humans.  If clinically indicated additional testing with an alternate test  methodology [phone]) is advised. The SARS-CoV-2 RNA is generally  detectable in upper and lower respiratory sp ecimens during the acute  phase of infection. The expected result is Negative. Fact Sheet for Patients:  StrictlyIdeas.no Fact Sheet for Healthcare Providers: BankingDealers.co.za This test is not yet approved or cleared by the [place] FDA and has been authorized for detection and/or diagnosis of SARS-CoV-2 by FDA under an Emergency Use Authorization (EUA).  This EUA will remain in effect (meaning this test can be used) for the duration of the COVID-19 declaration under Section 564(b)(1) of the Act, 21 U.S.C. section 360bbb-3(b)(1), unless the authorization is terminated or revoked sooner. Performed at [facility], [place] [address], [place], [place] [address]      Labs: BNP (last 3 results) No results for input(s): BNP in the last 8760 hours. Basic Metabolic Panel: Recent Labs  Lab 09/23/18 1654 09/24/18 0201 09/25/18 0537  NA 136 137 136  K 4.9 5.1 4.3  CL 101 104 103  CO2 23 22 22   GLUCOSE 120* 90 78  BUN 26* 23 17  CREATININE 1.39* 1.06  1.06 0.87  CALCIUM 9.6 9.3 9.0  MG  --  2.0  --   PHOS  --  3.7  --    Liver Function Tests: Recent Labs  Lab 09/23/18 1654  AST 18  ALT 15  ALKPHOS 46  BILITOT 0.9  PROT 8.5*  ALBUMIN 3.1*   No results for input(s): LIPASE, AMYLASE in the last 168 hours. No results for input(s): AMMONIA in the last 168 hours. CBC: Recent Labs  Lab 09/23/18 1654 09/24/18 0201 09/25/18 0537  WBC 8.0 7.5 4.4  NEUTROABS 6.3  --   --   HGB 12.1* 10.8* 10.5*  HCT 36.6* 32.2* 30.4*  MCV 101.1* 101.6* 101.3*  PLT 219 176 176   Cardiac Enzymes: No results for input(s): CKTOTAL, CKMB, CKMBINDEX, TROPONINI in the last 168 hours. BNP: Invalid input(s):  POCBNP CBG: No results for input(s): GLUCAP in the last 168 hours. D-Dimer No results for input(s): DDIMER in the last 72 hours. Hgb A1c No results for input(s): HGBA1C in the  last 72 hours. Lipid Profile No results for input(s): CHOL, HDL, LDLCALC, TRIG, CHOLHDL, LDLDIRECT in the last 72 hours. Thyroid function studies No results for input(s): TSH, T4TOTAL, T3FREE, THYROIDAB in the last 72 hours.  Invalid input(s): FREET3 Anemia work up No results for input(s): VITAMINB12, FOLATE, FERRITIN, TIBC, IRON, RETICCTPCT in the last 72 hours. Urinalysis    Component Value Date/Time   COLORURINE AMBER (A) 09/23/2018 1802   APPEARANCEUR HAZY (A) 09/23/2018 1802   LABSPEC 1.023 09/23/2018 1802   PHURINE 6.0 09/23/2018 1802   GLUCOSEU NEGATIVE 09/23/2018 1802   HGBUR MODERATE (A) 09/23/2018 1802   BILIRUBINUR NEGATIVE 09/23/2018 1802   KETONESUR NEGATIVE 09/23/2018 1802   PROTEINUR 30 (A) 09/23/2018 1802   UROBILINOGEN 2.0 (H) 07/21/2012 1145   NITRITE NEGATIVE 09/23/2018 1802   LEUKOCYTESUR LARGE (A) 09/23/2018 1802   Sepsis Labs Invalid input(s): PROCALCITONIN,  WBC,  LACTICIDVEN Microbiology Recent Results (from the past 240 hour(s))  Urine culture     Status: Abnormal   Collection Time: 09/23/18  6:02 PM   Specimen: Urine, Random  Result Value Ref Range Status   Specimen Description URINE, RANDOM  Final   Special Requests NONE  Final   Culture (A)  Final    >=100,000 COLONIES/mL STAPHYLOCOCCUS SPECIES (COAGULASE NEGATIVE)   Report Status 09/25/2018 FINAL  Final   Organism ID, Bacteria STAPHYLOCOCCUS SPECIES (COAGULASE NEGATIVE) (A)  Final      Susceptibility   Staphylococcus species (coagulase negative) - MIC*    CIPROFLOXACIN INTERMEDIATE Intermediate     GENTAMICIN <=0.5 SENSITIVE Sensitive     NITROFURANTOIN 32 SENSITIVE Sensitive     OXACILLIN SENSITIVE Sensitive     TETRACYCLINE <=1 SENSITIVE Sensitive     VANCOMYCIN 1 SENSITIVE Sensitive     TRIMETH/SULFA <=10  SENSITIVE Sensitive     CLINDAMYCIN <=0.25 SENSITIVE Sensitive     RIFAMPIN <=0.5 SENSITIVE Sensitive     Inducible Clindamycin NEGATIVE Sensitive     * >=100,000 COLONIES/mL STAPHYLOCOCCUS SPECIES (COAGULASE NEGATIVE)  Blood culture (routine x 2)     Status: None (Preliminary result)   Collection Time: 09/23/18  8:39 PM   Specimen: BLOOD  Result Value Ref Range Status   Specimen Description   Final    BLOOD BLOOD RIGHT HAND Performed at [facility], [address] [place], [place] [address]    Special Requests   Final    BOTTLES DRAWN AEROBIC ONLY Blood Culture results may not be optimal due to an inadequate volume of blood received in culture bottles Performed at [facility] [address] [place], [place] [address]    Culture   Final    NO GROWTH 2 DAYS Performed at [facility] [address] [place], [place] [address]    Report Status PENDING  Incomplete  Blood culture (routine x 2)     Status: None (Preliminary result)   Collection Time: 09/23/18  8:39 PM   Specimen: BLOOD  Result Value Ref Range Status   Specimen Description   Final    BLOOD BLOOD RIGHT HAND Performed at [facility] [address] [place], [place] [address]    Special Requests   Final    BOTTLES DRAWN AEROBIC AND ANAEROBIC Blood Culture adequate volume Performed at [facility] [address], [place], [place] [address]    Culture   Final    NO GROWTH 2 DAYS Performed at [facility] [place],  [place] [address]    Report Status PENDING  Incomplete  SARS Coronavirus 2 (CEPHEID- Performed in [facility] hospital lab), Hosp Order     Status: None   Collection Time: 09/23/18  8:39 PM   Specimen: Nasopharyngeal Swab  Result Value Ref Range Status   SARS Coronavirus 2 NEGATIVE NEGATIVE Final    Comment: (NOTE) If result is NEGATIVE SARS-CoV-2 target nucleic acids are NOT DETECTED. The  SARS-CoV-2 RNA is generally detectable in upper and lower  respiratory specimens during the acute phase of infection. The lowest  concentration of SARS-CoV-2 viral copies this assay can detect is 250  copies / mL. A negative result does not preclude SARS-CoV-2 infection  and should not be used as the sole basis for treatment or other  patient management decisions.  A negative result may occur with  improper specimen collection / handling, submission of specimen other  than nasopharyngeal swab, presence of viral mutation(s) within the  areas targeted by this assay, and inadequate number of viral copies  (<250 copies / mL). A negative result must be combined with clinical  observations, patient history, and epidemiological information. If result is POSITIVE SARS-CoV-2 target nucleic acids are DETECTED. The SARS-CoV-2 RNA is generally detectable in upper and lower  respiratory specimens dur ing the acute phase of infection.  Positive  results are indicative of active infection with SARS-CoV-2.  Clinical  correlation with patient history and other diagnostic information is  necessary to determine patient infection status.  Positive results do  not rule out bacterial infection or co-infection with other viruses. If result is PRESUMPTIVE POSTIVE SARS-CoV-2 nucleic acids MAY BE PRESENT.   A presumptive positive result was obtained on the submitted specimen  and confirmed on repeat testing.  While 2019 novel coronavirus  (SARS-CoV-2) nucleic acids may be present in the submitted sample  additional confirmatory testing may be necessary for epidemiological  and / or clinical management purposes  to differentiate between  SARS-CoV-2 and other Sarbecovirus currently known to infect humans.  If clinically indicated additional testing with an alternate test  methodology [phone]) is advised. The SARS-CoV-2 RNA is generally  detectable in upper and lower respiratory sp ecimens during the acute   phase of infection. The expected result is Negative. Fact Sheet for Patients:  StrictlyIdeas.no Fact Sheet for Healthcare Providers: BankingDealers.co.za This test is not yet approved or cleared by the [place] FDA and has been authorized for detection and/or diagnosis of SARS-CoV-2 by FDA under an Emergency Use Authorization (EUA).  This EUA will remain in effect (meaning this test can be used) for the duration of the COVID-19 declaration under Section 564(b)(1) of the Act, 21 U.S.C. section 360bbb-3(b)(1), unless the authorization is terminated or revoked sooner. Performed at [facility], [place] [address] [place] [address] [place], [place] [address]      Patient was seen and examined on the day of discharge and was found to be in stable condition. Time coordinating discharge: 35 minutes including assessment and coordination of care, as well as examination of the patient.   SIGNED:  Dessa Phi, DO [facility] [email or web address] 09/26/2018, 10:03 AM

## 2018-09-26 NOTE — TOC Progression Note (Signed)
Transition of Care [facility]) - Progression Note    Patient Details  Name: JONATAN WILSEY MRN: [id number] Date of Birth: 1927-08-28  Transition of Care [facility]) CM/SW [place], [place] Phone Number: 09/26/2018, 11:36 AM  Clinical Narrative:   LCSW received social work consult for SNF placement. LCSW completed call to patient's daughter on 09/26/2018. She reports that she received call from attending physician Dr. Maylene Roes this morning and was informed that patient's plans for discharge have changed from SNF to [facility]. Patient will need [facility] set up prior to discharge.    Expected Discharge Plan and Services- Patient needs [facility] setup  Expected Discharge Date: 09/26/18                 Readmission Risk Interventions No flowsheet data found.  Eula Fried, BSW, MSW, LCSW [facility] [email or web address] Phone: [phone]

## 2018-09-27 ENCOUNTER — Ambulatory Visit: Payer: Medicare Other | Admitting: Hematology

## 2018-09-27 ENCOUNTER — Other Ambulatory Visit: Payer: Medicare Other

## 2018-09-27 ENCOUNTER — Ambulatory Visit: Payer: Medicare Other

## 2018-09-28 ENCOUNTER — Telehealth: Payer: Self-pay

## 2018-09-28 LAB — CULTURE, BLOOD (ROUTINE X 2)
Culture: NO GROWTH
Culture: NO GROWTH
Special Requests: ADEQUATE

## 2018-09-28 NOTE — Telephone Encounter (Signed)
Attempted to contact patient to complete TCM and schedule hospital f/u.

## 2018-09-29 ENCOUNTER — Telehealth: Payer: Self-pay | Admitting: Internal Medicine

## 2018-09-29 NOTE — Telephone Encounter (Signed)
Left detailed message on VM for Martin Conner.

## 2018-09-29 NOTE — Telephone Encounter (Signed)
Okay for now I am going to see him next week and we will discuss considering hospice

## 2018-09-29 NOTE — Telephone Encounter (Signed)
Don,[facility],called with patient's plan of care.  OT 1 x a   week for 2 weeks for ADL and transfer and exercises.

## 2018-10-01 DIAGNOSIS — M5136 Other intervertebral disc degeneration, lumbar region: Secondary | ICD-10-CM

## 2018-10-01 DIAGNOSIS — F0390 Unspecified dementia without behavioral disturbance: Secondary | ICD-10-CM

## 2018-10-01 DIAGNOSIS — G9341 Metabolic encephalopathy: Secondary | ICD-10-CM

## 2018-10-01 DIAGNOSIS — I119 Hypertensive heart disease without heart failure: Secondary | ICD-10-CM

## 2018-10-01 DIAGNOSIS — D630 Anemia in neoplastic disease: Secondary | ICD-10-CM | POA: Diagnosis not present

## 2018-10-01 DIAGNOSIS — C9000 Multiple myeloma not having achieved remission: Secondary | ICD-10-CM

## 2018-10-01 DIAGNOSIS — I708 Atherosclerosis of other arteries: Secondary | ICD-10-CM

## 2018-10-01 DIAGNOSIS — D472 Monoclonal gammopathy: Secondary | ICD-10-CM

## 2018-10-01 DIAGNOSIS — R911 Solitary pulmonary nodule: Secondary | ICD-10-CM

## 2018-10-01 DIAGNOSIS — I4821 Permanent atrial fibrillation: Secondary | ICD-10-CM

## 2018-10-01 DIAGNOSIS — I442 Atrioventricular block, complete: Secondary | ICD-10-CM

## 2018-10-01 DIAGNOSIS — M150 Primary generalized (osteo)arthritis: Secondary | ICD-10-CM | POA: Diagnosis not present

## 2018-10-01 DIAGNOSIS — N390 Urinary tract infection, site not specified: Secondary | ICD-10-CM

## 2018-10-01 DIAGNOSIS — E785 Hyperlipidemia, unspecified: Secondary | ICD-10-CM

## 2018-10-01 DIAGNOSIS — Z8673 Personal history of transient ischemic attack (TIA), and cerebral infarction without residual deficits: Secondary | ICD-10-CM

## 2018-10-01 DIAGNOSIS — R339 Retention of urine, unspecified: Secondary | ICD-10-CM

## 2018-10-01 DIAGNOSIS — Z8781 Personal history of (healed) traumatic fracture: Secondary | ICD-10-CM

## 2018-10-01 DIAGNOSIS — E041 Nontoxic single thyroid nodule: Secondary | ICD-10-CM

## 2018-10-01 DIAGNOSIS — Z9181 History of falling: Secondary | ICD-10-CM

## 2018-10-01 DIAGNOSIS — Z96643 Presence of artificial hip joint, bilateral: Secondary | ICD-10-CM

## 2018-10-01 DIAGNOSIS — G9389 Other specified disorders of brain: Secondary | ICD-10-CM

## 2018-10-01 DIAGNOSIS — R627 Adult failure to thrive: Secondary | ICD-10-CM

## 2018-10-04 ENCOUNTER — Telehealth: Payer: Self-pay | Admitting: *Deleted

## 2018-10-04 ENCOUNTER — Inpatient Hospital Stay: Payer: Medicare Other

## 2018-10-04 ENCOUNTER — Inpatient Hospital Stay: Payer: Medicare Other | Admitting: Hematology

## 2018-10-04 NOTE — Telephone Encounter (Signed)
Patient did not arrive for lab appt at 9:15am this morning. Contacted patient - spoke with wife. She stated he was dizzy when he got up this morning, and that she cannot manage to get him to [facility] today for any of his appointments. She states he is often dizzy in the mornings. Verified with wife that patient's next schedule appts for lab/infusion are on 7/20. She verbalized understanding. Information given to Dr. Irene Limbo.

## 2018-10-06 ENCOUNTER — Encounter: Payer: Self-pay | Admitting: Internal Medicine

## 2018-10-06 ENCOUNTER — Ambulatory Visit: Payer: Medicare Other | Admitting: Internal Medicine

## 2018-10-06 DIAGNOSIS — I959 Hypotension, unspecified: Secondary | ICD-10-CM | POA: Insufficient documentation

## 2018-10-06 DIAGNOSIS — R339 Retention of urine, unspecified: Secondary | ICD-10-CM | POA: Diagnosis not present

## 2018-10-06 DIAGNOSIS — D689 Coagulation defect, unspecified: Secondary | ICD-10-CM

## 2018-10-06 DIAGNOSIS — F0390 Unspecified dementia without behavioral disturbance: Secondary | ICD-10-CM

## 2018-10-06 DIAGNOSIS — I9589 Other hypotension: Secondary | ICD-10-CM

## 2018-10-06 DIAGNOSIS — C9000 Multiple myeloma not having achieved remission: Secondary | ICD-10-CM | POA: Diagnosis not present

## 2018-10-06 DIAGNOSIS — K5900 Constipation, unspecified: Secondary | ICD-10-CM

## 2018-10-06 NOTE — Telephone Encounter (Signed)
Dr Silvio Pate saw him at home this afternoon and this should have been addressed.

## 2018-10-06 NOTE — Assessment & Plan Note (Signed)
Still mild but with functional decline Needs more help from wife now

## 2018-10-06 NOTE — Progress Notes (Signed)
Subjective:    Patient ID: Martin Conner, male    DOB: 05/18/1927, 85 y.o.   MRN: [id number]  HPI Home visit for follow up of multiple myeloma---probably done with Rx Wife and daughter are here  At a crossroads with the cancer treatment No obvious side effects but doesn't seem to be helping  Functional status has really declined Hard to wife to get him into car--or back into the house after the prolonged sessions Uses rolling walker Elevated toilet seat Afraid to get in shower Wife helps him get dressed  Trouble with bowels---prompted ER visits Found to be in urinary retention--now has the Foley catheter  (for about 3 weeks) Some better---but has still gone as long as a week without going Had such problems he would get really anxious when having to try  Using senna--but finally went with magnesium citrate--12 bottle  No chest pain No SOB No dizziness or syncope OT here today---BP down to 90/60 -----family felt he was "white as a sheet" No edema  Current Outpatient Medications on File Prior to Visit  Medication Sig Dispense Refill  . Acetaminophen (TYLENOL PO) Take 500 mg by mouth daily as needed (pain). Pt unsure of strength     . losartan (COZAAR) 50 MG tablet TAKE 1 TABLET TWICE A DAY (Patient taking differently: Take 50 mg by mouth 2 (two) times a day. ) 180 tablet 3  . ondansetron (ZOFRAN) 8 MG tablet Take 1 tablet (8 mg total) by mouth 2 (two) times daily as needed (Nausea or vomiting). 30 tablet 1  . sennosides-docusate sodium (SENOKOT-S) 8.6-50 MG tablet Take 2 tablets by mouth daily. (Patient taking differently: Take 2 tablets by mouth as needed for constipation. ) 30 tablet 1  . tamsulosin (FLOMAX) 0.4 MG CAPS capsule Take 1 capsule (0.4 mg total) by mouth daily after supper. 30 capsule 0   No current facility-administered medications on file prior to visit.     No Known Allergies  Past Medical History:  Diagnosis Date  . Fracture of femoral neck, right ([place])  07/10/2015   s/p mechanical fall  . Hyperlipidemia   . Hypertensive heart disease   . Lumbar disc disease   . Osteoarthritis   . Permanent atrial fibrillation    a. 07/2012 Echo: EF 55-60%, no rwma, mildly dil RA/LA.  Marland Kitchen Syncope    a. 07/2012 - unclear etiology-->resulted in right tib/fib fx req ORIF/intramedullary nail of tibia.    Past Surgical History:  Procedure Laterality Date  . ANKLE SURGERY     right ankle  . TIBIA IM NAIL INSERTION Right 07/21/2012   Procedure: INTRAMEDULLARY (IM) NAIL TIBIAL;  Surgeon: Johnny Bridge, MD;  Location: [facility];  Service: Orthopedics;  Laterality: Right;  . TOTAL HIP ARTHROPLASTY     left  . TOTAL HIP ARTHROPLASTY Right 07/11/2015   Procedure: TOTAL HIP ARTHROPLASTY;  Surgeon: Marchia Bond, MD;  Location: [place];  Service: Orthopedics;  Laterality: Right;    Family History  Problem Relation Age of Onset  . Cancer Mother   . Hyperlipidemia Sister   . Hypertension Sister     Social History   Socioeconomic History  . Marital status: Married    Spouse name: Not on file  . Number of children: 2  . Years of education: Not on file  . Highest education level: Not on file  Occupational History  . Occupation: retired     Fish farm manager: [employer]  . Financial resource strain:  Not on file  . Food insecurity    Worry: Not on file    Inability: Not on file  . Transportation needs    Medical: Not on file    Non-medical: Not on file  Tobacco Use  . Smoking status: Never Smoker  . Smokeless tobacco: Never Used  Substance and Sexual Activity  . Alcohol use: Yes    Alcohol/week: 12.0 standard drinks    Types: 12 Cans of beer per week    Comment: 2 beers several x/wk and occas cocktail on top of it.  . Drug use: No  . Sexual activity: Never  Lifestyle  . Physical activity    Days per week: Not on file    Minutes per session: Not on file  . Stress: Not on file  Relationships  . Social Herbalist on phone: Not on file     Gets together: Not on file    Attends religious service: Not on file    Active member of club or organization: Not on file    Attends meetings of clubs or organizations: Not on file    Relationship status: Not on file  . Intimate partner violence    Fear of current or ex partner: Not on file    Emotionally abused: Not on file    Physically abused: Not on file    Forced sexual activity: Not on file  Other Topics Concern  . Not on file  Social History Narrative   Not sure about living will   Requests that wife make decisions for him if needed   Would accept resuscitation but no prolonged artificial ventilation   Would not want tube feeds if cognitively unaware   Review of Systems No sig pain--rarely takes tylenol Appetite is not good---wife notes him eating very  Sleeping okay Has lost considerable weight--but not checking    Objective:   Physical Exam  Constitutional: No distress.  Neck: No thyromegaly present.  Cardiovascular: Normal rate and normal heart sounds. Exam reveals no gallop.  No murmur heard. irregular  Respiratory: Effort normal and breath sounds normal. No respiratory distress. He has no wheezes. He has no rales.  GI: Soft. There is no abdominal tenderness.  Musculoskeletal:        General: No edema.  Lymphadenopathy:    He has no cervical adenopathy.  Neurological:  Mild confusion---already forgot therapist came to see him, etc  Psychiatric: He has a normal mood and affect. His behavior is normal.           Assessment & Plan:

## 2018-10-06 NOTE — Telephone Encounter (Signed)
Don called regarding pt's BP and requesting orders for handling. BP- 80/40 HR- 82 97.4 temp  Pt took meds this morning, feels fine but when asked to standup was a little dizzy. Timmothy Sours is requesting a cb with direction asap. He is aware pt has a home visit sch today at 2:30pm. [phone]  Pt's phone if cannot reach Don's cell [phone]

## 2018-10-06 NOTE — Assessment & Plan Note (Signed)
Seems to have had some response to the infusions--but hard even getting there Discussed goals of care---he still wants to treat this They will proceed with the planned Rx on 7/20

## 2018-10-06 NOTE — Assessment & Plan Note (Signed)
Add daily miralax and senna Mag citrate prn

## 2018-10-06 NOTE — Telephone Encounter (Signed)
Spoke to Plato. Pt had already taken his losartan.

## 2018-10-06 NOTE — Assessment & Plan Note (Signed)
Probably multifactorial Will stop the losartan

## 2018-10-06 NOTE — Telephone Encounter (Signed)
Hold losartan if he hasn't already taken it. Great caution if he is going to get up I will see him this afternoon

## 2018-10-06 NOTE — Telephone Encounter (Signed)
It is discontinued now See my note about the home health nurse

## 2018-10-06 NOTE — Assessment & Plan Note (Signed)
Now with Foley Now on tamsulosin Failed voiding trial in past though

## 2018-10-06 NOTE — Telephone Encounter (Signed)
Timmothy Sours called asking if patient should hold on PM dose or if it's ok to take. He is aware that pt will see [place] this afternoon, he just needs the information for documentation

## 2018-10-07 ENCOUNTER — Telehealth: Payer: Self-pay | Admitting: *Deleted

## 2018-10-07 NOTE — Telephone Encounter (Signed)
Daughter called. Father has appt for labs/infusion on Monday 7/20, but had to miss appt with Dr. Irene Limbo last week. Does Dr. Irene Limbo need to see her father on Monday. Per Dr. Irene Limbo, after review of chart and recent hospitalization, patient can delay infusion for one week and Dr. Irene Limbo will see him before infusion then. Patient already scheduled for 7/27, sill send schedule message to add MD appt. Contacted daughter with this information, she verbalized understanding.

## 2018-10-11 ENCOUNTER — Ambulatory Visit: Payer: Medicare Other

## 2018-10-11 ENCOUNTER — Other Ambulatory Visit: Payer: Medicare Other

## 2018-10-12 ENCOUNTER — Telehealth: Payer: Self-pay | Admitting: Internal Medicine

## 2018-10-12 NOTE — Telephone Encounter (Signed)
Heather with [facility] called requesting orders to change patients catheter. It's due to be changed next week and they do not currently have orders to change it. She can be reached at [phone]. She is requesting the order to be for two weeks to give the patient time to get an appointment with the urologist.

## 2018-10-12 NOTE — Telephone Encounter (Signed)
Yes---please change the catheter Not sure he will be going back to the urologist given his overall status

## 2018-10-12 NOTE — Telephone Encounter (Signed)
Spoke to [employer]. She will do the change. The wife is really pushing to remove it with the Urologist. Trying to get him in sooner.

## 2018-10-13 ENCOUNTER — Telehealth: Payer: Self-pay | Admitting: *Deleted

## 2018-10-13 NOTE — Telephone Encounter (Signed)
That is fine 

## 2018-10-13 NOTE — Telephone Encounter (Signed)
Timmothy Sours, OT, called to request verbal orders. He is asking to extend OT for 1 visit to complete goals. [phone]

## 2018-10-13 NOTE — Telephone Encounter (Signed)
Emerson,Firefighter to seek transportation assistance for patient. Patient uses a wheelchair and it is difficult for family to provide his transportation to [facility] for appointments. Di Kindle requested information r/t patient appointments and states she will look into transportation assistance for patient.

## 2018-10-13 NOTE — Telephone Encounter (Signed)
Verbal orders left on VM 

## 2018-10-15 NOTE — Progress Notes (Signed)
HEMATOLOGY/ONCOLOGY CLINIC NOTE  Date of Service: 10/18/2018  Patient Care Team: Venia Carbon, MD as PCP - Bethena Roys, MD as Consulting Physician (Cardiology) Marchia Bond, MD as Consulting Physician (Orthopedic Surgery)  CHIEF COMPLAINTS/PURPOSE OF CONSULTATION:  Multiple Myeloma  HISTORY OF PRESENTING ILLNESS:   Martin Conner is a wonderful 85 y.o. male who has been referred to [place] by Dr. Nicholas Lose for evaluation and management of his newly diagnosed Multiple Myeloma. The pt reports that he is doing well overall.   The pt reports that he has not felt differently recently as compared to his baseline 6-12 months ago. The pt notes that he had a physical in late January 2020 with his PCP and routine labs which revealed a concern for high protein. This was evaluated further and the pt was referred to my colleague Dr. Lindi Adie who has completed initial work up.  The pt denies problems passing urine, bowel abnormalities, or neuropathy. He also denies back pains.  The pt notes that he walks very little, ambulates with a cane or a walker, but can get around in his home well. He lives with his wife in his own home. The pt denies having falls, and uses a cane/walker to prevent falls. The pt notes that he has two children, and notes that they are not very involved with his medical decision making.  The pt denies heart, liver, lung, thyroid problems. He does have Afib however, and takes Eliquis.  Most recent lab results (06/03/18) of CBC w/diff is as follows: all values are WNL except for WBC at 3.8k, RBC at 3.73, HGB at 12.5, HCT at 37.4, MCV at 100.3, PLT at 145k. 06/03/18 MMP revealed all values WNL except for IgA at 2646, Total Protein at 8.7, B-globulin at 3.6, M Protein at 2.6g, and Globulin Total at 5.2. 06/03/18 Beta-2 microglobulin at 3.1 06/03/18 SFLC revealed Kappa at 1390.2, and K:L ratio at 105.32  On review of systems, pt reports stable energy levels, and denies  urination abnormalities, back pain, changes in bowel habits, abdominal pains, and any other symptoms.   On PMHx the pt reports Afib. On Social Hx the pt reports infrequent single alcoholic beverage. Denies ever smoking cigarettes. 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 On Family Hx the pt denies significant concerns.  Interval History:    Martin Conner returns today for management and evaluation of his Multiple Myeloma. The patient's last visit with [place] was on 09/13/2018. The pt reports that she is doing well overall. He was accompanied by phone by his wife, [name].  The pt reports that he feels great and is functioning better at home. He has discussed his goals of care with his wife and they would like to continue with treatment for the time being.  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    MEDICAL HISTORY:  Past Medical History:  Diagnosis Date   Fracture of femoral neck, right ([place]) 07/10/2015   s/p mechanical fall   Hyperlipidemia    Hypertensive heart disease    Lumbar disc disease    Osteoarthritis    Permanent atrial fibrillation    a. 07/2012 Echo: EF 55-60%, no rwma, mildly dil RA/LA.   Syncope    a. 07/2012 - unclear etiology-->resulted in right tib/fib fx req ORIF/intramedullary nail of tibia.    SURGICAL HISTORY: Past Surgical History:  Procedure Laterality Date   ANKLE SURGERY      right ankle   TIBIA IM NAIL INSERTION Right 07/21/2012   Procedure: INTRAMEDULLARY (IM) NAIL TIBIAL;  Surgeon: Johnny Bridge, MD;  Location: [place];  Service: Orthopedics;  Laterality: Right;   TOTAL HIP ARTHROPLASTY     left   TOTAL HIP ARTHROPLASTY Right 07/11/2015   Procedure: TOTAL HIP ARTHROPLASTY;  Surgeon: Marchia Bond, MD;  Location: [place];  Service: Orthopedics;  Laterality: Right;    SOCIAL HISTORY: Social History   Socioeconomic History   Marital status: Married    Spouse name: Not on file   Number of children: 2   Years of education: Not on file   Highest education level: Not on file  Occupational History   Occupation: retired     Fish farm manager: [employer]  Scientist, product/process development strain: Not on file   Food insecurity    Worry: Not on file    Inability: Not on file   Transportation needs    Medical: Not on file    Non-medical: Not on file  Tobacco Use   Smoking status: Never Smoker   Smokeless tobacco: Never Used  Substance and Sexual Activity   Alcohol use: Yes    Alcohol/week: 12.0 standard drinks    Types: 12 Cans of beer per week    Comment: 2 beers several x/wk and occas cocktail on top of it.   Drug use: No   Sexual activity: Never  Lifestyle   Physical activity    Days per week: Not on file    Minutes per session: Not on file   Stress: Not on file  Relationships   Social connections    Talks on phone: Not on file    Gets together: Not on file    Attends religious service: Not on file    Active member of club or organization: Not on file    Attends meetings of clubs or organizations: Not on file    Relationship status: Not on file   Intimate partner violence    Fear of current or ex partner: Not on file    Emotionally abused: Not on file    Physically abused: Not on file    Forced sexual activity: Not on file  Other Topics Concern   Not on file  Social History Narrative   Not sure about living will   Requests  that wife make decisions for him if needed   Would accept resuscitation but no prolonged artificial ventilation   Would not want tube feeds if cognitively unaware    FAMILY HISTORY: Family History  Problem Relation Age of Onset   Cancer Mother    Hyperlipidemia Sister    Hypertension Sister     ALLERGIES:  has No Known Allergies.  MEDICATIONS:  Current Outpatient Medications  Medication Sig Dispense Refill   Acetaminophen (TYLENOL PO) Take 500 mg by mouth daily as needed (pain). Pt unsure of strength  ondansetron (ZOFRAN) 8 MG tablet Take 1 tablet (8 mg total) by mouth 2 (two) times daily as needed (Nausea or vomiting). 30 tablet 1   polyethylene glycol (MIRALAX / GLYCOLAX) 17 g packet Take 17 g by mouth daily.     sennosides-docusate sodium (SENOKOT-S) 8.6-50 MG tablet Take 2 tablets by mouth daily. (Patient taking differently: Take 2 tablets by mouth as needed for constipation. ) 30 tablet 1   tamsulosin (FLOMAX) 0.4 MG CAPS capsule Take 1 capsule (0.4 mg total) by mouth daily after supper. 30 capsule 0   No current facility-administered medications for this visit.     REVIEW OF SYSTEMS:    A 10+ POINT REVIEW OF SYSTEMS WAS OBTAINED including neurology, dermatology, psychiatry, cardiac, respiratory, lymph, extremities, GI, GU, Musculoskeletal, constitutional, breasts, reproductive, HEENT.  All pertinent positives are noted in the HPI.  All others are negative.   PHYSICAL EXAMINATION: ECOG PERFORMANCE STATUS: 3 - Symptomatic, >50% confined to bed  Vitals:   10/18/18 0926  BP: 111/62  Pulse: (!) 111  Resp: 16  Temp: 98.7 F (37.1 C)  SpO2: 98%   Filed Weights   10/18/18 0926  Weight: 160 lb 3.2 oz (72.7 kg)   .Body mass index is 23.66 kg/m.   GENERAL:alert, in no acute distress and comfortable SKIN: no acute rashes, no significant lesions EYES: conjunctiva are pink and non-injected, sclera anicteric OROPHARYNX: MMM, no exudates, no oropharyngeal  erythema or ulceration NECK: supple, no JVD LYMPH:  no palpable lymphadenopathy in the cervical, axillary or inguinal regions LUNGS: clear to auscultation b/l with normal respiratory effort HEART: regular rate & rhythm ABDOMEN:  normoactive bowel sounds , non tender, not distended. No palpable hepatosplenomegaly.  Extremity: no pedal edema PSYCH: alert & oriented x 3 with fluent speech NEURO: no focal motor/sensory deficits   LABORATORY DATA:  I have reviewed the data as listed  . CBC Latest Ref Rng & Units 10/18/2018 09/25/2018 09/24/2018  WBC 4.0 - 10.5 K/uL 4.9 4.4 7.5  Hemoglobin 13.0 - 17.0 g/dL 11.6(L) 10.5(L) 10.8(L)  Hematocrit 39.0 - 52.0 % 33.9(L) 30.4(L) 32.2(L)  Platelets 150 - 400 K/uL 210 176 176  ANC1.5k  . CMP Latest Ref Rng & Units 10/18/2018 09/25/2018 09/24/2018  Glucose 70 - 99 mg/dL 97 78 -  BUN 8 - 23 mg/dL 14 17 -  Creatinine 0.61 - 1.24 mg/dL 1.17 0.87 1.06  Sodium 135 - 145 mmol/L 137 136 -  Potassium 3.5 - 5.1 mmol/L 3.9 4.3 -  Chloride 98 - 111 mmol/L 100 103 -  CO2 22 - 32 mmol/L 24 22 -  Calcium 8.9 - 10.3 mg/dL 10.2 9.0 -  Total Protein 6.5 - 8.1 g/dL 9.5(H) - -  Total Bilirubin 0.3 - 1.2 mg/dL 0.5 - -  Alkaline Phos 38 - 126 U/L 45 - -  AST 15 - 41 U/L 14(L) - -  ALT 0 - 44 U/L 9 - -    06/03/18 BM Bx:    06/03/18 Cytogenetics:      RADIOGRAPHIC STUDIES: I have personally reviewed the radiological images as listed and agreed with the findings in the report. Ct Head Wo Contrast  Result Date: 09/23/2018 CLINICAL DATA:  85 year old male with altered mental status and weakness. Multiple myeloma. EXAM: CT HEAD WITHOUT CONTRAST TECHNIQUE: Contiguous axial images were obtained from the base of the skull through the vertex without intravenous contrast. COMPARISON:  09/07/2018 head CT, brain MRI 04/29/2018. FINDINGS: Brain: Chronic encephalomalacia in the posterior right MCA and the  right PCA territories. Stable cerebral volume. Small area of cortical  encephalomalacia in the more anterior right superior frontal gyrus on series 3, image 25. No midline shift, ventriculomegaly, mass effect, evidence of mass lesion, intracranial hemorrhage or evidence of cortically based acute infarction. Vascular: Extensive Calcified atherosclerosis at the skull base. No suspicious intracranial vascular hyperdensity. Skull: Stable, no lytic osseous lesion identified. Sinuses/Orbits: Visualized paranasal sinuses and mastoids are stable and well pneumatized. Other: Stable orbit and scalp soft tissues. 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 Electronically Signed   By: Genevie Ann M.D.   On: 09/23/2018 18:31    ASSESSMENT & PLAN:   85 y.o. male with  1. Multiple Myeloma 06/03/18 BM Biopsy revealed cellular marrow involved by plasma cell neoplasm (about 50%) 06/03/18 Cytogenetics revealed 54.5% of cells showed gain of ATM, and 51.5% of cells with loss of p53 06/03/18 Bone Survey revealed Possible 8 mm lytic lesion versus subarachnoid granulation within the parietal convexity of the calvarium. No other suspicious lytic lesions seen within the skeleton. Labs upon initial presentation from 06/03/18, M Protein at 2.6g with IgA kappa specificity. Some new mild anemia with HGB at 12.5.  06/30/18 M Protein at 3.1g  07/20/18 PET/CT revealed "No hypermetabolic bone disease evident. 2. 12 mm hypermetabolic right thyroid nodule, nonspecific. Thyroid ultrasound could be used to further evaluate as clinically warranted in this individual. No other unexpected hypermetabolic soft tissue disease identified on today's study. 3. Bilateral tiny pulmonary nodules measuring up to about 3 mm maximum size. Consider follow-up to ensure stability. 4.  Aortic Atherosclerosis."  09/23/2018 CT head wo contrast revealed "Stable non contrast CT appearance of the brain with chronic right posterior MCA and right PCA territory  infarcts."  PLAN: -Discussed pt labwork today, 10/18/2018; blood counts have improved, kidney function is stable -Pt and his wife would like to switch from Velcade shots to [facility] and steroid pill weekly to reduce burden on rpt clinic visits -Discussed again that the pt has a 17p mutation, which can be a predictor of treatment resistance and a possibly more aggressive course. -Continue Acyclovir BID -prescribed Ninlaro 20m weekly on D1,8,15 every 28 days with Dexamethasone 235mweekly.discussed risk vs benefits of this treatment. -further defined goals of care patient wants to proceed with Ninlaro. -Will see the pt back in 4 weeks   RTC with Dr KaIrene Limboith labs in 4 weeks   I discussed the assessment and treatment plan with the patient. The patient was provided an opportunity to ask questions and all were answered. The patient agreed with the plan and demonstrated an understanding of the instructions.    The total time spent in the appt was 25 minutes and more than 50% was on counseling and direct patient cares.    GaSullivan LoneD MS AAHIVMS SCNebraska Medical CenterTEncompass Health Rehabilitation Hospital Of Tinton Fallsematology/Oncology Physician CoFlagstaff Medical Center(Office):       [phone] cell):  [phone]):           [phone]/27/2020 10:20 AM  I, EmDe Burrsam acting as a scribe for Dr. KaIrene Limbo.I have reviewed the above documentation for accuracy and completeness, and I agree with the above. .GBrunetta GeneraD

## 2018-10-18 ENCOUNTER — Other Ambulatory Visit: Payer: Self-pay

## 2018-10-18 ENCOUNTER — Inpatient Hospital Stay: Payer: Medicare Other

## 2018-10-18 ENCOUNTER — Telehealth: Payer: Self-pay | Admitting: Hematology

## 2018-10-18 ENCOUNTER — Inpatient Hospital Stay: Payer: Medicare Other | Attending: Hematology and Oncology

## 2018-10-18 ENCOUNTER — Inpatient Hospital Stay (HOSPITAL_BASED_OUTPATIENT_CLINIC_OR_DEPARTMENT_OTHER): Payer: Medicare Other | Admitting: Hematology

## 2018-10-18 VITALS — HR 68

## 2018-10-18 VITALS — BP 111/62 | HR 111 | Temp 98.7°F | Resp 16 | Ht 69.0 in | Wt 160.2 lb

## 2018-10-18 DIAGNOSIS — Z7189 Other specified counseling: Secondary | ICD-10-CM

## 2018-10-18 DIAGNOSIS — C9000 Multiple myeloma not having achieved remission: Secondary | ICD-10-CM | POA: Diagnosis not present

## 2018-10-18 LAB — CMP (CANCER CENTER ONLY)
ALT: 9 U/L (ref 0–44)
AST: 14 U/L — ABNORMAL LOW (ref 15–41)
Albumin: 3 g/dL — ABNORMAL LOW (ref 3.5–5.0)
Alkaline Phosphatase: 45 U/L (ref 38–126)
Anion gap: 13 (ref 5–15)
BUN: 14 mg/dL (ref 8–23)
CO2: 24 mmol/L (ref 22–32)
Calcium: 10.2 mg/dL (ref 8.9–10.3)
Chloride: 100 mmol/L (ref 98–111)
Creatinine: 1.17 mg/dL (ref 0.61–1.24)
GFR, Est AFR Am: >60 mL/min (ref >60)
GFR, Estimated: 55 mL/min — ABNORMAL LOW (ref >60)
Glucose, Bld: 97 mg/dL (ref 70–99)
Potassium: 3.9 mmol/L (ref 3.5–5.1)
Sodium: 137 mmol/L (ref 135–145)
Total Bilirubin: 0.5 mg/dL (ref 0.3–1.2)
Total Protein: 9.5 g/dL — ABNORMAL HIGH (ref 6.5–8.1)

## 2018-10-18 LAB — CBC WITH DIFFERENTIAL/PLATELET
Abs Immature Granulocytes: 0 10*3/uL (ref 0.00–0.07)
Basophils Absolute: 0 10*3/uL (ref 0.0–0.1)
Basophils Relative: 1 %
Eosinophils Absolute: 0 10*3/uL (ref 0.0–0.5)
Eosinophils Relative: 1 %
HCT: 33.9 % — ABNORMAL LOW (ref 39.0–52.0)
Hemoglobin: 11.6 g/dL — ABNORMAL LOW (ref 13.0–17.0)
Immature Granulocytes: 0 %
Lymphocytes Relative: 55 %
Lymphs Abs: 2.7 10*3/uL (ref 0.7–4.0)
MCH: 33.6 pg (ref 26.0–34.0)
MCHC: 34.2 g/dL (ref 30.0–36.0)
MCV: 98.3 fL (ref 80.0–100.0)
Monocytes Absolute: 0.2 10*3/uL (ref 0.1–1.0)
Monocytes Relative: 4 %
Neutro Abs: 1.9 10*3/uL (ref 1.7–7.7)
Neutrophils Relative %: 39 %
Platelets: 210 10*3/uL (ref 150–400)
RBC: 3.45 MIL/uL — ABNORMAL LOW (ref 4.22–5.81)
RDW: 15.2 % (ref 11.5–15.5)
WBC: 4.9 10*3/uL (ref 4.0–10.5)
nRBC: 0 % (ref 0.0–0.2)

## 2018-10-18 MED ORDER — DEXAMETHASONE 4 MG PO TABS
20.0000 mg | ORAL_TABLET | Freq: Once | ORAL | Status: AC
  Administered 2018-10-18: 20 mg via ORAL

## 2018-10-18 MED ORDER — BORTEZOMIB CHEMO SQ INJECTION 3.5 MG (2.5MG/ML)
1.0000 mg/m2 | Freq: Once | INTRAMUSCULAR | Status: AC
  Administered 2018-10-18: 2 mg via SUBCUTANEOUS
  Filled 2018-10-18: qty 0.8

## 2018-10-18 MED ORDER — DEXAMETHASONE 4 MG PO TABS
ORAL_TABLET | ORAL | Status: AC
  Filled 2018-10-18: qty 5

## 2018-10-18 MED ORDER — PROCHLORPERAZINE MALEATE 10 MG PO TABS
ORAL_TABLET | ORAL | Status: AC
  Filled 2018-10-18: qty 1

## 2018-10-18 MED ORDER — PROCHLORPERAZINE MALEATE 10 MG PO TABS
10.0000 mg | ORAL_TABLET | Freq: Once | ORAL | Status: AC
  Administered 2018-10-18: 10 mg via ORAL

## 2018-10-18 NOTE — Telephone Encounter (Signed)
Scheduled appt per 7/27 los. ° °Printed and mailed appt calendar. °

## 2018-10-20 ENCOUNTER — Emergency Department (HOSPITAL_COMMUNITY)
Admission: EM | Admit: 2018-10-20 | Discharge: 2018-10-20 | Disposition: A | Discharge status: Home / Self Care | Payer: Medicare Other | Source: Home / Self Care | Attending: Emergency Medicine | Admitting: Emergency Medicine

## 2018-10-20 ENCOUNTER — Other Ambulatory Visit: Payer: Self-pay

## 2018-10-20 ENCOUNTER — Encounter (HOSPITAL_COMMUNITY): Payer: Self-pay | Admitting: Emergency Medicine

## 2018-10-20 DIAGNOSIS — Z960 Presence of urogenital implants: Secondary | ICD-10-CM | POA: Insufficient documentation

## 2018-10-20 DIAGNOSIS — F0390 Unspecified dementia without behavioral disturbance: Secondary | ICD-10-CM | POA: Insufficient documentation

## 2018-10-20 DIAGNOSIS — N390 Urinary tract infection, site not specified: Secondary | ICD-10-CM | POA: Insufficient documentation

## 2018-10-20 DIAGNOSIS — Z79899 Other long term (current) drug therapy: Secondary | ICD-10-CM | POA: Insufficient documentation

## 2018-10-20 DIAGNOSIS — Z96643 Presence of artificial hip joint, bilateral: Secondary | ICD-10-CM | POA: Insufficient documentation

## 2018-10-20 DIAGNOSIS — C9000 Multiple myeloma not having achieved remission: Secondary | ICD-10-CM | POA: Insufficient documentation

## 2018-10-20 LAB — URINALYSIS, ROUTINE W REFLEX MICROSCOPIC
Bilirubin Urine: NEGATIVE
Glucose, UA: NEGATIVE mg/dL
Ketones, ur: NEGATIVE mg/dL
Nitrite: NEGATIVE
Protein, ur: 100 mg/dL — AB
RBC / HPF: >50 RBC/hpf — ABNORMAL HIGH (ref 0–5)
Specific Gravity, Urine: 1.02 (ref 1.005–1.030)
WBC, UA: >50 WBC/hpf — ABNORMAL HIGH (ref 0–5)
pH: 5 (ref 5.0–8.0)

## 2018-10-20 LAB — CBC WITH DIFFERENTIAL/PLATELET
Abs Immature Granulocytes: 0.27 10*3/uL — ABNORMAL HIGH (ref 0.00–0.07)
Basophils Absolute: 0 10*3/uL (ref 0.0–0.1)
Basophils Relative: 0 %
Eosinophils Absolute: 0 10*3/uL (ref 0.0–0.5)
Eosinophils Relative: 0 %
HCT: 36.3 % — ABNORMAL LOW (ref 39.0–52.0)
Hemoglobin: 12 g/dL — ABNORMAL LOW (ref 13.0–17.0)
Immature Granulocytes: 1 %
Lymphocytes Relative: 4 %
Lymphs Abs: 0.7 10*3/uL (ref 0.7–4.0)
MCH: 34.2 pg — ABNORMAL HIGH (ref 26.0–34.0)
MCHC: 33.1 g/dL (ref 30.0–36.0)
MCV: 103.4 fL — ABNORMAL HIGH (ref 80.0–100.0)
Monocytes Absolute: 0.7 10*3/uL (ref 0.1–1.0)
Monocytes Relative: 4 %
Neutro Abs: 17.3 10*3/uL — ABNORMAL HIGH (ref 1.7–7.7)
Neutrophils Relative %: 91 %
Platelets: 203 10*3/uL (ref 150–400)
RBC: 3.51 MIL/uL — ABNORMAL LOW (ref 4.22–5.81)
RDW: 16.4 % — ABNORMAL HIGH (ref 11.5–15.5)
WBC: 19 10*3/uL — ABNORMAL HIGH (ref 4.0–10.5)
nRBC: 0.1 % (ref 0.0–0.2)

## 2018-10-20 LAB — MULTIPLE MYELOMA PANEL, SERUM
Albumin SerPl Elph-Mcnc: 3.4 g/dL (ref 2.9–4.4)
Albumin/Glob SerPl: 0.7 (ref 0.7–1.7)
Alpha 1: 0.3 g/dL (ref 0.0–0.4)
Alpha2 Glob SerPl Elph-Mcnc: 1 g/dL (ref 0.4–1.0)
B-Globulin SerPl Elph-Mcnc: 3.6 g/dL — ABNORMAL HIGH (ref 0.7–1.3)
Gamma Glob SerPl Elph-Mcnc: 0.3 g/dL — ABNORMAL LOW (ref 0.4–1.8)
Globulin, Total: 5.2 g/dL — ABNORMAL HIGH (ref 2.2–3.9)
IgA: 3066 mg/dL — ABNORMAL HIGH (ref 61–437)
IgG (Immunoglobin G), Serum: 491 mg/dL — ABNORMAL LOW (ref 603–1613)
IgM (Immunoglobulin M), Srm: 13 mg/dL — ABNORMAL LOW (ref 15–143)
M Protein SerPl Elph-Mcnc: 2.6 g/dL — ABNORMAL HIGH
Total Protein ELP: 8.6 g/dL — ABNORMAL HIGH (ref 6.0–8.5)

## 2018-10-20 LAB — BASIC METABOLIC PANEL
Anion gap: 12 (ref 5–15)
BUN: 29 mg/dL — ABNORMAL HIGH (ref 8–23)
CO2: 22 mmol/L (ref 22–32)
Calcium: 9.4 mg/dL (ref 8.9–10.3)
Chloride: 103 mmol/L (ref 98–111)
Creatinine, Ser: 1.35 mg/dL — ABNORMAL HIGH (ref 0.61–1.24)
GFR calc Af Amer: 54 mL/min — ABNORMAL LOW (ref >60)
GFR calc non Af Amer: 47 mL/min — ABNORMAL LOW (ref >60)
Glucose, Bld: 87 mg/dL (ref 70–99)
Potassium: 3.8 mmol/L (ref 3.5–5.1)
Sodium: 137 mmol/L (ref 135–145)

## 2018-10-20 MED ORDER — SODIUM CHLORIDE 0.9 % IV BOLUS
1000.0000 mL | Freq: Once | INTRAVENOUS | Status: AC
  Administered 2018-10-20: 1000 mL via INTRAVENOUS

## 2018-10-20 MED ORDER — CEPHALEXIN 500 MG PO CAPS
500.0000 mg | ORAL_CAPSULE | Freq: Once | ORAL | Status: AC
  Administered 2018-10-20: 500 mg via ORAL
  Filled 2018-10-20: qty 1

## 2018-10-20 MED ORDER — CEPHALEXIN 500 MG PO CAPS: 500.0000 mg | ORAL_CAPSULE | Freq: Two times a day (BID) | ORAL | 0 refills | Status: DC

## 2018-10-20 NOTE — ED Notes (Signed)
Pt, made aware for the need of urine specimen.

## 2018-10-20 NOTE — ED Notes (Signed)
Patient's wife at bedside.

## 2018-10-20 NOTE — ED Notes (Addendum)
New leg bag applied by Jonna NT.

## 2018-10-20 NOTE — ED Provider Notes (Signed)
[facility] DEPT Provider Note   CSN: [id number] Arrival date & time: 10/20/18  1104    History   Chief Complaint Chief Complaint  Patient presents with   Catheter problems    HPI Martin Conner is a 85 y.o. male.     HPI   85 year old male with history below including history of multiple myeloma, dementia and indwelling Foley catheter, presents with concern for concern for Foley catheter function, and suprapubic abdominal pain.  Wife reports that he does have a catheter change yesterday, and she changed out the bag this morning at 5 AM, and since then she feels that has been decreased urine output.  Reports that he was complaining of lower abdominal pain right above his bladder. Reports he feels like he needs to urinate but can't. Denies fevers, nausea, vomiting, flank pain.  No diarrhea.  Denies any other chest pain, shortness of breath or other symptoms.  Reports he seems to be eating and drinking normally, although he regularly does not drink enough water.  Past Medical History:  Diagnosis Date   Fracture of femoral neck, right ([place]) 07/10/2015   s/p mechanical fall   Hyperlipidemia    Hypertensive heart disease    Lumbar disc disease    Osteoarthritis    Permanent atrial fibrillation    a. 07/2012 Echo: EF 55-60%, no rwma, mildly dil RA/LA.   Syncope    a. 07/2012 - unclear etiology-->resulted in right tib/fib fx req ORIF/intramedullary nail of tibia.    Patient Active Problem List   Diagnosis Date Noted   Blood clotting disorder ([place]) 10/06/2018   Urinary retention 10/06/2018   Hypotension 32/20/2542   Acute metabolic encephalopathy 70/62/3762   Complicated UTI (urinary tract infection) 09/23/2018   Multiple myeloma ([place]) 07/19/2018   Counseling regarding advance care planning and goals of care 07/19/2018   MGUS (monoclonal gammopathy of unknown significance) 05/17/2018   Dementia ([place]) 04/20/2018   Memory loss  04/17/2017   S/P total hip arthroplasty 07/30/2015   Complete heart block ([place]) 05/13/2015   Hypertensive heart disease    Chronic atrial fibrillation    Hyperlipidemia    Advanced directives, counseling/discussion 02/03/2014   Constipation 08/10/2012   Routine general medical examination at a health care facility 01/30/2012   MITRAL REGURGITATION 06/05/2009   Osteoarthrosis, generalized, involving multiple sites 01/20/2008   [place] DISEASE, LUMBAR SPINE 08/09/2007   Essential hypertension, benign 05/26/2007    Past Surgical History:  Procedure Laterality Date   ANKLE SURGERY     right ankle   TIBIA IM NAIL INSERTION Right 07/21/2012   Procedure: INTRAMEDULLARY (IM) NAIL TIBIAL;  Surgeon: Johnny Bridge, MD;  Location: [facility];  Service: Orthopedics;  Laterality: Right;   TOTAL HIP ARTHROPLASTY     left   TOTAL HIP ARTHROPLASTY Right 07/11/2015   Procedure: TOTAL HIP ARTHROPLASTY;  Surgeon: Marchia Bond, MD;  Location: [facility];  Service: Orthopedics;  Laterality: Right;        Home Medications    Prior to Admission medications   Medication Sig Start Date End Date Taking? Authorizing Provider  ibuprofen (ADVIL) 200 MG tablet Take 400 mg by mouth every 6 (six) hours as needed for moderate pain.   Yes [provider]  polyethylene glycol (MIRALAX / GLYCOLAX) 17 g packet Take 17 g by mouth daily.   Yes [provider]  tamsulosin (FLOMAX) 0.4 MG CAPS capsule Take 1 capsule (0.4 mg total) by mouth daily after supper. 09/12/18  Yes Dorie Rank, MD  Acetaminophen (TYLENOL PO) Take 500 mg by mouth daily as needed (pain). Pt unsure of strength     [provider]  cephALEXin (KEFLEX) 500 MG capsule Take 1 capsule (500 mg total) by mouth 2 (two) times daily for 7 days. 10/20/18 10/27/18  Gareth Morgan, MD  ixazomib citrate (NINLARO) 3 MG capsule Take 1 capsule (3 mg) by mouth weekly, 3 weeks on, 1 week off, repeat every 4 weeks. Take on an  empty stomach 1hr before or 2hr after meals. 10/21/18   Brunetta Genera, MD  ondansetron (ZOFRAN) 8 MG tablet Take 1 tablet (8 mg total) by mouth 2 (two) times daily as needed (Nausea or vomiting). 07/19/18   Brunetta Genera, MD  sennosides-docusate sodium (SENOKOT-S) 8.6-50 MG tablet Take 2 tablets by mouth daily. Patient taking differently: Take 2 tablets by mouth as needed for constipation.  07/11/15   Marchia Bond, MD    Family History Family History  Problem Relation Age of Onset   Cancer Mother    Hyperlipidemia Sister    Hypertension Sister     Social History Social History   Tobacco Use   Smoking status: Never Smoker   Smokeless tobacco: Never Used  Substance Use Topics   Alcohol use: Yes    Alcohol/week: 12.0 standard drinks    Types: 12 Cans of beer per week    Comment: 2 beers several x/wk and occas cocktail on top of it.   Drug use: No     Allergies   Patient has no known allergies.   Review of Systems Review of Systems  Unable to perform ROS: Dementia  Constitutional: Negative for fever.  HENT: Negative for sore throat.   Eyes: Negative for visual disturbance.  Respiratory: Negative for shortness of breath.   Cardiovascular: Negative for chest pain.  Gastrointestinal: Positive for abdominal pain. Negative for nausea and vomiting.  Genitourinary: Positive for difficulty urinating.  Musculoskeletal: Negative for back pain and neck stiffness.  Skin: Negative for rash.  Neurological: Negative for syncope and headaches.     Physical Exam Updated Vital Signs BP (!) 119/98    Pulse 87    Temp (!) 97.4 F (36.3 C) (Oral)    Resp 18    SpO2 99%   Physical Exam Vitals signs and nursing note reviewed.  Constitutional:      General: He is not in acute distress.    Appearance: He is well-developed. He is not diaphoretic.  HENT:     Head: Normocephalic and atraumatic.  Eyes:     Conjunctiva/sclera: Conjunctivae normal.  Neck:      Musculoskeletal: Normal range of motion.  Cardiovascular:     Rate and Rhythm: Normal rate and regular rhythm.  Pulmonary:     Effort: Pulmonary effort is normal. No respiratory distress.  Abdominal:     General: There is no distension.     Palpations: Abdomen is soft.     Tenderness: There is no abdominal tenderness. There is no guarding.  Genitourinary:    Comments: Foley cathter in place, urine in foley bag Skin:    General: Skin is warm and dry.  Neurological:     Mental Status: He is alert and oriented to person, place, and time.      ED Treatments / Results  Labs (all labs ordered are listed, but only abnormal results are displayed) Labs Reviewed  CBC WITH DIFFERENTIAL/PLATELET - Abnormal; Notable for the following components:  Result Value   WBC 19.0 (*)    RBC 3.51 (*)    Hemoglobin 12.0 (*)    HCT 36.3 (*)    MCV 103.4 (*)    MCH 34.2 (*)    RDW 16.4 (*)    Neutro Abs 17.3 (*)    Abs Immature Granulocytes 0.27 (*)    All other components within normal limits  BASIC METABOLIC PANEL - Abnormal; Notable for the following components:   BUN 29 (*)    Creatinine, Ser 1.35 (*)    GFR calc non Af Amer 47 (*)    GFR calc Af Amer 54 (*)    All other components within normal limits  URINALYSIS, ROUTINE W REFLEX MICROSCOPIC - Abnormal; Notable for the following components:   Color, Urine AMBER (*)    APPearance CLOUDY (*)    Hgb urine dipstick LARGE (*)    Protein, ur 100 (*)    Leukocytes,Ua LARGE (*)    RBC / HPF >50 (*)    WBC, UA >50 (*)    Bacteria, UA RARE (*)    All other components within normal limits  URINE CULTURE    EKG None  Radiology Ct Renal Stone Study  Result Date: 10/21/2018 CLINICAL DATA:  Left abdominal and pelvis pain for 3 days EXAM: CT ABDOMEN AND PELVIS WITHOUT CONTRAST TECHNIQUE: Multidetector CT imaging of the abdomen and pelvis was performed following the standard protocol without IV contrast. COMPARISON:  PET-CT 07/20/2018  FINDINGS: Lower chest:  Coronary atherosclerosis. Hepatobiliary: No focal liver abnormality.Layering high-density in the gallbladder fundus, sludge versus calculi. No evidence of pericholecystic inflammation. Pancreas: Unremarkable. Spleen: Unremarkable. Adrenals/Urinary Tract: Negative adrenals. No hydronephrosis or ureteral stone. Bilateral renal cystic densities. 15 mm stone in the lower pole right kidney. Bladder is largely obscured by streak artifact from the hips. A Foley catheter drains the bladder. Stomach/Bowel: No obstruction. No appendicitis. Multiple colonic diverticula. Vascular/Lymphatic: No acute vascular abnormality. Extensive atherosclerotic calcification. No mass or adenopathy. Reproductive:Negative Other: No ascites or pneumoperitoneum. Musculoskeletal: Spondylosis.  Bilateral hip arthroplasty. IMPRESSION: 1. No acute finding. 2. Bilateral nephrolithiasis. 3. Extensive colonic diverticulosis. 4. Gallbladder sludge versus calculi without evidence of cholecystitis. Electronically Signed   By: Monte Fantasia M.D.   On: 10/21/2018 10:11    Procedures Procedures (including critical care time)  Medications Ordered in ED Medications  sodium chloride 0.9 % bolus 1,000 mL (0 mLs Intravenous Stopped 10/20/18 1926)  cephALEXin (KEFLEX) capsule 500 mg (500 mg Oral Given 10/20/18 2153)     Initial Impression / Assessment and Plan / ED Course  I have reviewed the triage vital signs and the nursing notes.  Pertinent labs & imaging results that were available during my care of the patient were reviewed by me and considered in my medical decision making (see chart for details).        85 year old male with history below including history of multiple myeloma, dementia and indwelling Foley catheter, presents with concern for concern for Foley catheter function, and suprapubic abdominal pain.  On my examination, patient is comfortable, with a benign abdominal exam, and has urine in his Foley  catheter bag. Doubt other etiologies of abdominal pain such as diverticulitis, appendicitis. Bladder scan was completed which showed 0 urine in the bladder.  Given patient has urine in the Foley catheter bag, and does not seem to be retaining, we do feel that the catheter is draining appropriately.  I do have concerns given leukocytosis on lab work, and urinary type symptoms  that his symptoms may be secondary to bladder irritation or cystitis.  Labs also show a mild acute kidney injury with a creatinine of 1.35.  Consider possibility of temporary obstruction prior to presentation of foley although I do not see sediment in the tubing or bag. Suspect dehydration contributing to decreased urine output noted by wife.   Patient hydrated in the emergency department with normal saline.  Urinalysis confirms urinary tract infection. .Culture in process.  Given prescription for keflex and one dose in the ED.  Again urine noted in catheter bag after emptying and hydration, and rechecked bladder scan and also looked with bedside US which confirmed balloon in bladder and no urinary retention.  Discussed that symptoms likely secondary to UTI. He has a leukocytosis but is otherwise at baseline and with normal vital signs and appropriate for outpatient treatment.  Given keflex and recommend hydration, PCP follow up and recheck of Cr.   Final Clinical Impressions(s) / ED Diagnoses   Final diagnoses:  Urinary tract infection without hematuria, site unspecified    ED Discharge Orders         Ordered    cephALEXin (KEFLEX) 500 MG capsule  2 times daily     10/20/18 2134           Gareth Morgan, MD 10/21/18 1441

## 2018-10-20 NOTE — ED Triage Notes (Signed)
Pt reports that his catheter not having urine in it for couple days. Reports his wife emptied his believes yesterday. Pt denies pain and urine noted in bag in triage.

## 2018-10-20 NOTE — ED Notes (Signed)
[facility] Bladder Scan

## 2018-10-20 NOTE — ED Notes (Signed)
Patient's daughter called for update. With patient permission, spoke with patient's daughter. Phone number of patient's daughter, Jan, provided to MD for update.

## 2018-10-21 ENCOUNTER — Inpatient Hospital Stay (HOSPITAL_COMMUNITY)
Admission: EM | Admit: 2018-10-21 | Discharge: 2018-10-23 | DRG: 698 | Disposition: A | Discharge status: Home Health | Payer: Medicare Other | Attending: Internal Medicine | Admitting: Internal Medicine

## 2018-10-21 ENCOUNTER — Other Ambulatory Visit: Payer: Self-pay | Admitting: Hematology

## 2018-10-21 ENCOUNTER — Emergency Department (HOSPITAL_COMMUNITY): Payer: Medicare Other

## 2018-10-21 ENCOUNTER — Other Ambulatory Visit: Payer: Self-pay

## 2018-10-21 ENCOUNTER — Telehealth: Payer: Self-pay | Admitting: Pharmacist

## 2018-10-21 ENCOUNTER — Encounter (HOSPITAL_COMMUNITY): Payer: Self-pay | Admitting: Emergency Medicine

## 2018-10-21 ENCOUNTER — Telehealth: Payer: Self-pay | Admitting: *Deleted

## 2018-10-21 DIAGNOSIS — I1310 Hypertensive heart and chronic kidney disease without heart failure, with stage 1 through stage 4 chronic kidney disease, or unspecified chronic kidney disease: Secondary | ICD-10-CM | POA: Diagnosis present

## 2018-10-21 DIAGNOSIS — Z20828 Contact with and (suspected) exposure to other viral communicable diseases: Secondary | ICD-10-CM | POA: Diagnosis present

## 2018-10-21 DIAGNOSIS — Z8249 Family history of ischemic heart disease and other diseases of the circulatory system: Secondary | ICD-10-CM | POA: Diagnosis not present

## 2018-10-21 DIAGNOSIS — N4889 Other specified disorders of penis: Secondary | ICD-10-CM | POA: Diagnosis not present

## 2018-10-21 DIAGNOSIS — C9000 Multiple myeloma not having achieved remission: Secondary | ICD-10-CM | POA: Diagnosis present

## 2018-10-21 DIAGNOSIS — Y846 Urinary catheterization as the cause of abnormal reaction of the patient, or of later complication, without mention of misadventure at the time of the procedure: Secondary | ICD-10-CM | POA: Diagnosis present

## 2018-10-21 DIAGNOSIS — N179 Acute kidney failure, unspecified: Secondary | ICD-10-CM | POA: Diagnosis present

## 2018-10-21 DIAGNOSIS — N183 Chronic kidney disease, stage 3 (moderate): Secondary | ICD-10-CM | POA: Diagnosis present

## 2018-10-21 DIAGNOSIS — D539 Nutritional anemia, unspecified: Secondary | ICD-10-CM | POA: Diagnosis present

## 2018-10-21 DIAGNOSIS — H9190 Unspecified hearing loss, unspecified ear: Secondary | ICD-10-CM | POA: Diagnosis present

## 2018-10-21 DIAGNOSIS — D72829 Elevated white blood cell count, unspecified: Secondary | ICD-10-CM | POA: Diagnosis not present

## 2018-10-21 DIAGNOSIS — Z96643 Presence of artificial hip joint, bilateral: Secondary | ICD-10-CM | POA: Diagnosis present

## 2018-10-21 DIAGNOSIS — D638 Anemia in other chronic diseases classified elsewhere: Secondary | ICD-10-CM | POA: Diagnosis present

## 2018-10-21 DIAGNOSIS — Z79899 Other long term (current) drug therapy: Secondary | ICD-10-CM | POA: Diagnosis not present

## 2018-10-21 DIAGNOSIS — T83511A Infection and inflammatory reaction due to indwelling urethral catheter, initial encounter: Secondary | ICD-10-CM | POA: Diagnosis not present

## 2018-10-21 DIAGNOSIS — N390 Urinary tract infection, site not specified: Secondary | ICD-10-CM | POA: Diagnosis not present

## 2018-10-21 DIAGNOSIS — A419 Sepsis, unspecified organism: Secondary | ICD-10-CM | POA: Diagnosis present

## 2018-10-21 DIAGNOSIS — Z8349 Family history of other endocrine, nutritional and metabolic diseases: Secondary | ICD-10-CM

## 2018-10-21 DIAGNOSIS — I482 Chronic atrial fibrillation, unspecified: Secondary | ICD-10-CM

## 2018-10-21 DIAGNOSIS — M1990 Unspecified osteoarthritis, unspecified site: Secondary | ICD-10-CM | POA: Diagnosis present

## 2018-10-21 DIAGNOSIS — F0390 Unspecified dementia without behavioral disturbance: Secondary | ICD-10-CM | POA: Diagnosis present

## 2018-10-21 DIAGNOSIS — I4821 Permanent atrial fibrillation: Secondary | ICD-10-CM | POA: Diagnosis present

## 2018-10-21 DIAGNOSIS — E785 Hyperlipidemia, unspecified: Secondary | ICD-10-CM | POA: Diagnosis present

## 2018-10-21 LAB — TYPE AND SCREEN
ABO/RH(D): A POS
Antibody Screen: NEGATIVE

## 2018-10-21 LAB — LACTIC ACID, PLASMA
Lactic Acid, Venous: 2.2 mmol/L (ref 0.5–1.9)
Lactic Acid, Venous: 2 mmol/L (ref 0.5–1.9)
Lactic Acid, Venous: 1.9 mmol/L (ref 0.5–1.9)
Lactic Acid, Venous: 2.6 mmol/L (ref 0.5–1.9)

## 2018-10-21 LAB — CBC WITH DIFFERENTIAL/PLATELET
Abs Immature Granulocytes: 0.58 10*3/uL — ABNORMAL HIGH (ref 0.00–0.07)
Basophils Absolute: 0 10*3/uL (ref 0.0–0.1)
Basophils Relative: 0 %
Eosinophils Absolute: 0 10*3/uL (ref 0.0–0.5)
Eosinophils Relative: 0 %
HCT: 29 % — ABNORMAL LOW (ref 39.0–52.0)
Hemoglobin: 9.9 g/dL — ABNORMAL LOW (ref 13.0–17.0)
Immature Granulocytes: 3 %
Lymphocytes Relative: 3 %
Lymphs Abs: 0.6 10*3/uL — ABNORMAL LOW (ref 0.7–4.0)
MCH: 34.3 pg — ABNORMAL HIGH (ref 26.0–34.0)
MCHC: 34.1 g/dL (ref 30.0–36.0)
MCV: 100.3 fL — ABNORMAL HIGH (ref 80.0–100.0)
Monocytes Absolute: 0.2 10*3/uL (ref 0.1–1.0)
Monocytes Relative: 1 %
Neutro Abs: 17.3 10*3/uL — ABNORMAL HIGH (ref 1.7–7.7)
Neutrophils Relative %: 93 %
Platelets: 190 10*3/uL (ref 150–400)
RBC: 2.89 MIL/uL — ABNORMAL LOW (ref 4.22–5.81)
RDW: 16 % — ABNORMAL HIGH (ref 11.5–15.5)
WBC: 18.6 10*3/uL — ABNORMAL HIGH (ref 4.0–10.5)
nRBC: 0.1 % (ref 0.0–0.2)

## 2018-10-21 LAB — COMPREHENSIVE METABOLIC PANEL
ALT: 13 U/L (ref 0–44)
AST: 18 U/L (ref 15–41)
Albumin: 2.3 g/dL — ABNORMAL LOW (ref 3.5–5.0)
Alkaline Phosphatase: 46 U/L (ref 38–126)
Anion gap: 10 (ref 5–15)
BUN: 28 mg/dL — ABNORMAL HIGH (ref 8–23)
CO2: 21 mmol/L — ABNORMAL LOW (ref 22–32)
Calcium: 9.3 mg/dL (ref 8.9–10.3)
Chloride: 104 mmol/L (ref 98–111)
Creatinine, Ser: 1.33 mg/dL — ABNORMAL HIGH (ref 0.61–1.24)
GFR calc Af Amer: 55 mL/min — ABNORMAL LOW (ref >60)
GFR calc non Af Amer: 47 mL/min — ABNORMAL LOW (ref >60)
Glucose, Bld: 76 mg/dL (ref 70–99)
Potassium: 4.1 mmol/L (ref 3.5–5.1)
Sodium: 135 mmol/L (ref 135–145)
Total Bilirubin: 0.8 mg/dL (ref 0.3–1.2)
Total Protein: 8.1 g/dL (ref 6.5–8.1)

## 2018-10-21 LAB — HEMOGLOBIN AND HEMATOCRIT, BLOOD
HCT: 28.3 % — ABNORMAL LOW (ref 39.0–52.0)
Hemoglobin: 9.5 g/dL — ABNORMAL LOW (ref 13.0–17.0)

## 2018-10-21 LAB — SARS CORONAVIRUS 2 BY RT PCR (HOSPITAL ORDER, PERFORMED IN ~~LOC~~ HOSPITAL LAB): SARS Coronavirus 2: NEGATIVE

## 2018-10-21 MED ORDER — SODIUM CHLORIDE 0.9 % IV BOLUS
750.0000 mL | Freq: Once | INTRAVENOUS | Status: AC
  Administered 2018-10-21: 750 mL via INTRAVENOUS

## 2018-10-21 MED ORDER — SODIUM CHLORIDE 0.9 % IV SOLN
1.0000 g | INTRAVENOUS | Status: DC
  Administered 2018-10-22 – 2018-10-23 (×2): 1 g via INTRAVENOUS
  Filled 2018-10-21 (×2): qty 10

## 2018-10-21 MED ORDER — TAMSULOSIN HCL 0.4 MG PO CAPS
0.4000 mg | ORAL_CAPSULE | Freq: Every day | ORAL | Status: DC
  Administered 2018-10-21 – 2018-10-22 (×2): 0.4 mg via ORAL
  Filled 2018-10-21 (×3): qty 1

## 2018-10-21 MED ORDER — POLYETHYLENE GLYCOL 3350 17 G PO PACK
17.0000 g | PACK | Freq: Every day | ORAL | Status: DC
  Administered 2018-10-22 – 2018-10-23 (×2): 17 g via ORAL
  Filled 2018-10-21 (×2): qty 1

## 2018-10-21 MED ORDER — POLYVINYL ALCOHOL 1.4 % OP SOLN
1.0000 [drp] | Freq: Two times a day (BID) | OPHTHALMIC | Status: DC
  Administered 2018-10-21 – 2018-10-23 (×4): 1 [drp] via OPHTHALMIC
  Filled 2018-10-21: qty 15

## 2018-10-21 MED ORDER — APIXABAN 5 MG PO TABS
5.0000 mg | ORAL_TABLET | Freq: Two times a day (BID) | ORAL | Status: DC
  Filled 2018-10-21: qty 1

## 2018-10-21 MED ORDER — SODIUM CHLORIDE 0.9 % IV SOLN: 1.0000 g | INTRAVENOUS | Status: DC

## 2018-10-21 MED ORDER — SODIUM CHLORIDE 0.9 % IV BOLUS
500.0000 mL | Freq: Once | INTRAVENOUS | Status: AC
  Administered 2018-10-21: 500 mL via INTRAVENOUS

## 2018-10-21 MED ORDER — SODIUM CHLORIDE 0.9 % IV BOLUS (SEPSIS)
1000.0000 mL | Freq: Once | INTRAVENOUS | Status: AC
  Administered 2018-10-21: 1000 mL via INTRAVENOUS

## 2018-10-21 MED ORDER — SODIUM CHLORIDE 0.9 % IV SOLN
INTRAVENOUS | Status: DC
  Administered 2018-10-21 – 2018-10-23 (×5): via INTRAVENOUS

## 2018-10-21 MED ORDER — IXAZOMIB CITRATE 3 MG PO CAPS: ORAL_CAPSULE | ORAL | 1 refills | Status: DC

## 2018-10-21 MED ORDER — SENNOSIDES-DOCUSATE SODIUM 8.6-50 MG PO TABS: 2.0000 | ORAL_TABLET | ORAL | Status: DC | PRN

## 2018-10-21 MED ORDER — SODIUM CHLORIDE 0.9 % IV SOLN
1.0000 g | INTRAVENOUS | Status: AC
  Administered 2018-10-21: 1 g via INTRAVENOUS
  Filled 2018-10-21: qty 10

## 2018-10-21 MED ORDER — SODIUM CHLORIDE 0.9 % IV SOLN
1.0000 g | Freq: Once | INTRAVENOUS | Status: AC
  Administered 2018-10-21: 1 g via INTRAVENOUS
  Filled 2018-10-21: qty 10

## 2018-10-21 MED ORDER — MORPHINE SULFATE (PF) 2 MG/ML IV SOLN
2.0000 mg | INTRAVENOUS | Status: AC
  Administered 2018-10-21: 2 mg via INTRAVENOUS
  Filled 2018-10-21: qty 1

## 2018-10-21 MED ORDER — HYDROCODONE-ACETAMINOPHEN 5-325 MG PO TABS: 1.0000 | ORAL_TABLET | Freq: Four times a day (QID) | ORAL | Status: DC | PRN

## 2018-10-21 NOTE — H&P (Signed)
History and Physical    Martin Conner GOT:[id number] DOB: 05-20-1927 DOA: 10/21/2018  Referring MD/NP/PA: Malvin Johns, MD PCP: Venia Carbon, MD  Patient coming from: Home  Chief Complaint: private pain  I have personally briefly reviewed patient's old medical records in [place]   HPI: Martin Conner is a 85 y.o. male with medical history significant of multiple myeloma, permanent atrial fibrillation, hypertension, hyperlipidemia, dementia, and chronic kidney disease stage III; who presented with complaints of penile and testicle pain for last 2 days.  He just had his catheter changed out 2 days ago by the home health nurse.  He has been taking ibuprofen without relief of pain symptoms at home.  Seen in the emergency department yesterday with similar symptoms.  At that time the catheter was evaluated and appeared to be draining properly.  He was noted to have a urinary tract infection, urine culture was sent, and he was prescribed oral antibiotics of Keflex.  However after getting home patient still felt weak with continued complaints of pain.  Other associated symptoms included decreased appetite, nausea, and insomnia.   ED Course: Upon admission into the emergency department patient was noted to have a temperature of 99.9 F, pulse 90-1 15, respirations 17-21, blood pressure 80 over 69-105/60, and O2 saturation maintained on room air.  Labs revealed WBCs 18.6, hemoglobin 9.9, BUN 28, creatinine 1.33, and lactic acid 2.2->2.  Urinalysis from yesterday revealed large leukocytes, nitrite negative, rare bacteria, greater than 50 WBCs, and greater than 50 RBCs.  COVID-19 screening negative Foley catheter was switched out in the ED.  CT scan of the abdomen and pelvis showed no acute findings.  Blood cultures are obtained as patient met sepsis criteria.  ED physician also ordered to receive Rocephin IV, and he was given 500 mL of normal saline IV fluids.  Review of Systems  Unable to perform  ROS: Dementia  Constitutional: Positive for malaise/fatigue. Negative for fever.  HENT: Negative for congestion and sinus pain.   Eyes: Positive for discharge. Negative for photophobia.  Respiratory: Negative for cough and shortness of breath.   Cardiovascular: Negative for chest pain and leg swelling.  Gastrointestinal: Positive for nausea. Negative for constipation and vomiting.  Genitourinary: Positive for dysuria.       Positive for penile pain  Musculoskeletal: Negative for back pain and falls.  Skin: Negative for rash.  Neurological: Positive for weakness. Negative for focal weakness and loss of consciousness.  Psychiatric/Behavioral: Positive for memory loss. Negative for substance abuse. The patient has insomnia.     Past Medical History:  Diagnosis Date  . Fracture of femoral neck, right ([place]) 07/10/2015   s/p mechanical fall  . Hyperlipidemia   . Hypertensive heart disease   . Lumbar disc disease   . Osteoarthritis   . Permanent atrial fibrillation    a. 07/2012 Echo: EF 55-60%, no rwma, mildly dil RA/LA.  Marland Kitchen Syncope    a. 07/2012 - unclear etiology-->resulted in right tib/fib fx req ORIF/intramedullary nail of tibia.    Past Surgical History:  Procedure Laterality Date  . ANKLE SURGERY     right ankle  . TIBIA IM NAIL INSERTION Right 07/21/2012   Procedure: INTRAMEDULLARY (IM) NAIL TIBIAL;  Surgeon: Johnny Bridge, MD;  Location: [facility];  Service: Orthopedics;  Laterality: Right;  . TOTAL HIP ARTHROPLASTY     left  . TOTAL HIP ARTHROPLASTY Right 07/11/2015   Procedure: TOTAL HIP ARTHROPLASTY;  Surgeon: Marchia Bond, MD;  Location:  [facility];  Service: Orthopedics;  Laterality: Right;     reports that he has never smoked. He has never used smokeless tobacco. He reports current alcohol use of about 12.0 standard drinks of alcohol per week. He reports that he does not use drugs.  No Known Allergies  Family History  Problem Relation Age of Onset  . Cancer Mother   .  Hyperlipidemia Sister   . Hypertension Sister     Prior to Admission medications   Medication Sig Start Date End Date Taking? Authorizing Provider  Acetaminophen (TYLENOL PO) Take 500 mg by mouth daily as needed (pain). Pt unsure of strength    Yes [provider]  cephALEXin (KEFLEX) 500 MG capsule Take 1 capsule (500 mg total) by mouth 2 (two) times daily for 7 days. 10/20/18 10/27/18 Yes Gareth Morgan, MD  ibuprofen (ADVIL) 200 MG tablet Take 400 mg by mouth every 6 (six) hours as needed for moderate pain.   Yes [provider]  ondansetron (ZOFRAN) 8 MG tablet Take 1 tablet (8 mg total) by mouth 2 (two) times daily as needed (Nausea or vomiting). 07/19/18  Yes Brunetta Genera, MD  polyethylene glycol (MIRALAX / GLYCOLAX) 17 g packet Take 17 g by mouth daily.   Yes [provider]  sennosides-docusate sodium (SENOKOT-S) 8.6-50 MG tablet Take 2 tablets by mouth daily. Patient taking differently: Take 2 tablets by mouth as needed for constipation.  07/11/15  Yes Marchia Bond, MD  tamsulosin (FLOMAX) 0.4 MG CAPS capsule Take 1 capsule (0.4 mg total) by mouth daily after supper. 09/12/18  Yes Dorie Rank, MD    Physical Exam:  Constitutional: Elderly male in NAD, calm, comfortable Vitals:   10/21/18 0819 10/21/18 0845 10/21/18 1000 10/21/18 1030  BP: 105/60 (!) 103/57 (!) 98/54 104/62  Pulse: (!) 108 (!) 101 90 91  Resp: (!) 21 (!) _0 Temp:      TempSrc:      SpO2: 96% 95% 95% 95%  Weight:      Height:       Eyes: PERRL, bilateral conjunctival injection and discharge present. ENMT: Mucous membranes are moist. Posterior pharynx clear of any exudate or lesions.Normal dentition.  Neck: normal, supple, no masses, no thyromegaly Respiratory: clear to auscultation bilaterally, no wheezing, no crackles. Normal respiratory effort. No accessory muscle use.  Cardiovascular: Regular rate and rhythm, no murmurs / rubs / gallops. No extremity edema. 2+ pedal  pulses. No carotid bruits.  Abdomen: no tenderness, no masses palpated. No hepatosplenomegaly. Bowel sounds positive.  Musculoskeletal: no clubbing / cyanosis. No joint deformity upper and lower extremities. Good ROM, no contractures. Normal muscle tone.  Skin: no rashes, lesions, ulcers. No induration Neurologic: CN 2-12 grossly intact. Sensation intact, DTR normal. Strength 5/5 in all 4.  Psychiatric:  Alert and oriented x 2. Normal mood.     Labs on Admission: I have personally reviewed following labs and imaging studies  CBC: Recent Labs  Lab 10/18/18 0855 10/20/18 1617 10/21/18 0651  WBC 4.9 19.0* 18.6*  NEUTROABS 1.9 17.3* 17.3*  HGB 11.6* 12.0* 9.9*  HCT 33.9* 36.3* 29.0*  MCV 98.3 103.4* 100.3*  PLT 210 203 859   Basic Metabolic Panel: Recent Labs  Lab 10/18/18 0855 10/20/18 1617 10/21/18 0651  NA 137 137 135  K 3.9 3.8 4.1  CL 100 103 104  CO2 24 22 21*  GLUCOSE 97 87 76  BUN 14 29* 28*  CREATININE 1.17 1.35* 1.33*  CALCIUM 10.2  9.4 9.3   GFR: Estimated Creatinine Clearance: 38.9 mL/min (A) (by C-G formula based on SCr of 1.33 mg/dL (H)). Liver Function Tests: Recent Labs  Lab 10/18/18 0855 10/21/18 0651  AST 14* 18  ALT 9 13  ALKPHOS 45 46  BILITOT 0.5 0.8  PROT 9.5* 8.1  ALBUMIN 3.0* 2.3*   No results for input(s): LIPASE, AMYLASE in the last 168 hours. No results for input(s): AMMONIA in the last 168 hours. Coagulation Profile: No results for input(s): INR, PROTIME in the last 168 hours. Cardiac Enzymes: No results for input(s): CKTOTAL, CKMB, CKMBINDEX, TROPONINI in the last 168 hours. BNP (last 3 results) No results for input(s): PROBNP in the last 8760 hours. HbA1C: No results for input(s): HGBA1C in the last 72 hours. CBG: No results for input(s): GLUCAP in the last 168 hours. Lipid Profile: No results for input(s): CHOL, HDL, LDLCALC, TRIG, CHOLHDL, LDLDIRECT in the last 72 hours. Thyroid Function Tests: No results for input(s):  TSH, T4TOTAL, FREET4, T3FREE, THYROIDAB in the last 72 hours. Anemia Panel: No results for input(s): VITAMINB12, FOLATE, FERRITIN, TIBC, IRON, RETICCTPCT in the last 72 hours. Urine analysis:    Component Value Date/Time   COLORURINE AMBER (A) 10/20/2018 2032   APPEARANCEUR CLOUDY (A) 10/20/2018 2032   LABSPEC 1.020 10/20/2018 2032   PHURINE 5.0 10/20/2018 2032   GLUCOSEU NEGATIVE 10/20/2018 2032   HGBUR LARGE (A) 10/20/2018 2032   BILIRUBINUR NEGATIVE 10/20/2018 2032   KETONESUR NEGATIVE 10/20/2018 2032   PROTEINUR 100 (A) 10/20/2018 2032   UROBILINOGEN 2.0 (H) 07/21/2012 1145   NITRITE NEGATIVE 10/20/2018 2032   LEUKOCYTESUR LARGE (A) 10/20/2018 2032   Sepsis Labs: Recent Results (from the past 240 hour(s))  SARS Coronavirus 2 (CEPHEID - Performed in [facility] hospital lab), Hosp Order     Status: None   Collection Time: 10/21/18  8:53 AM   Specimen: Nasopharyngeal Swab  Result Value Ref Range Status   SARS Coronavirus 2 NEGATIVE NEGATIVE Final    Comment: (NOTE) If result is NEGATIVE SARS-CoV-2 target nucleic acids are NOT DETECTED. The SARS-CoV-2 RNA is generally detectable in upper and lower  respiratory specimens during the acute phase of infection. The lowest  concentration of SARS-CoV-2 viral copies this assay can detect is 250  copies / mL. A negative result does not preclude SARS-CoV-2 infection  and should not be used as the sole basis for treatment or other  patient management decisions.  A negative result may occur with  improper specimen collection / handling, submission of specimen other  than nasopharyngeal swab, presence of viral mutation(s) within the  areas targeted by this assay, and inadequate number of viral copies  (<250 copies / mL). A negative result must be combined with clinical  observations, patient history, and epidemiological information. If result is POSITIVE SARS-CoV-2 target nucleic acids are DETECTED. The SARS-CoV-2 RNA is generally  detectable in upper and lower  respiratory specimens dur ing the acute phase of infection.  Positive  results are indicative of active infection with SARS-CoV-2.  Clinical  correlation with patient history and other diagnostic information is  necessary to determine patient infection status.  Positive results do  not rule out bacterial infection or co-infection with other viruses. If result is PRESUMPTIVE POSTIVE SARS-CoV-2 nucleic acids MAY BE PRESENT.   A presumptive positive result was obtained on the submitted specimen  and confirmed on repeat testing.  While 2019 novel coronavirus  (SARS-CoV-2) nucleic acids may be present in the submitted sample  additional  confirmatory testing may be necessary for epidemiological  and / or clinical management purposes  to differentiate between  SARS-CoV-2 and other Sarbecovirus currently known to infect humans.  If clinically indicated additional testing with an alternate test  methodology [id number]) is advised. The SARS-CoV-2 RNA is generally  detectable in upper and lower respiratory sp ecimens during the acute  phase of infection. The expected result is Negative. Fact Sheet for Patients:  StrictlyIdeas.no Fact Sheet for Healthcare Providers: BankingDealers.co.za This test is not yet approved or cleared by the [place] FDA and has been authorized for detection and/or diagnosis of SARS-CoV-2 by FDA under an Emergency Use Authorization (EUA).  This EUA will remain in effect (meaning this test can be used) for the duration of the COVID-19 declaration under Section 564(b)(1) of the Act, 21 U.S.C. section 360bbb-3(b)(1), unless the authorization is terminated or revoked sooner. Performed at [facility], [place] [address] [place], [place] [address]      Radiological Exams on Admission: Ct Renal Stone Study  Result Date: 10/21/2018 CLINICAL DATA:  Left abdominal and pelvis pain for 3 days  EXAM: CT ABDOMEN AND PELVIS WITHOUT CONTRAST TECHNIQUE: Multidetector CT imaging of the abdomen and pelvis was performed following the standard protocol without IV contrast. COMPARISON:  PET-CT 07/20/2018 FINDINGS: Lower chest:  Coronary atherosclerosis. Hepatobiliary: No focal liver abnormality.Layering high-density in the gallbladder fundus, sludge versus calculi. No evidence of pericholecystic inflammation. Pancreas: Unremarkable. Spleen: Unremarkable. Adrenals/Urinary Tract: Negative adrenals. No hydronephrosis or ureteral stone. Bilateral renal cystic densities. 15 mm stone in the lower pole right kidney. Bladder is largely obscured by streak artifact from the hips. A Foley catheter drains the bladder. Stomach/Bowel: No obstruction. No appendicitis. Multiple colonic diverticula. Vascular/Lymphatic: No acute vascular abnormality. Extensive atherosclerotic calcification. No mass or adenopathy. Reproductive:Negative Other: No ascites or pneumoperitoneum. Musculoskeletal: Spondylosis.  Bilateral hip arthroplasty. IMPRESSION: 1. No acute finding. 2. Bilateral nephrolithiasis. 3. Extensive colonic diverticulosis. 4. Gallbladder sludge versus calculi without evidence of cholecystitis. Electronically Signed   By: Monte Fantasia M.D.   On: 10/21/2018 10:11    EKG: Independently reviewed.  Atrial fibrillation  Assessment/Plan Sepsis secondary to complicated urinary tract infection: Acute.  Patient presents with complaints of penile and testicular pain.  Has chronic indwelling Foley catheter which was changed out in the emergency department.  Urinalysis from yesterday suggestive of infection.  Urine culture pending.  Patient was tachycardic with lab work revealed WBC 18.6 and lactic acid elevated up to 2.2 meeting sepsis criteria.  Patient was given 100 mL of normal saline IV fluids. -Admit to a medical telemetry bed -Sepsis protocol initiated -Follow-up blood and urine cultures -Continue Rocephin IV -Trend  lactic acid levels -Recheck CBC in a.m. -Bolus 1.75 L normal saline IV fluids to complete 30cc/kg  History of Essential hypertension: Patient with previous history of hypertension but has been taken off all blood pressure medications due to recent issues with hypotension.  On admission blood pressures were noted to be soft. -IV fluids as seen above  Macrocytic anemia: Acute on chronic.  Patient's baseline creatinine hemoglobin previously noted to be around told 12 g/dL yesterday, but presents with hemoglobin drop to 9.9 g/dL.  Patient denies any reports of bleeding. -Type and screen for possible need of blood products -Continue to monitor H&H and transfuse blood products as needed  Acute kidney injury: Patient presents with creatinine elevated to 1.33 and BUN 28. Baseline creatinine previously noted to be around 0.8.  CT scan of the abdomen pelvis showed  nephrolithiasis without signs of obstruction. -Continue normal saline IV fluids -Avoid nephrotoxic agents  -Recheck creatinine in a.m.  Permanent atrial fibrillation: Chronic.  Patient previously was on Eliquis, but had self stopped sometime within the last year per his wife.CHA2DS2-VASc score =3.  She would like him to be restarted on this medication.  She denies him having any recent falls or issues with bleeding. -Consider restarting Eliquis, if no signs of bleeding noted  History of multiple myeloma: Patient followed by Dr. Irene Limbo of oncology and just received a bortezomib infusion on 7/27. -Recommend continued outpatient follow-up with Dr. Irene Limbo  Dementia: Patient is alert and oriented to person DVT prophylaxis: SCD Code Status: Full Family Communication: Discussed plan of care with the patient's wife present at bedside Disposition Plan: Possible discharge home in 2 to 3 days Consults called: None Admission status: Inpatient  Norval Morton MD [facility] Pager [phone]   If 7PM-7AM, please contact night-coverage  [email or web address] Password [email or web address]  10/21/2018, 10:46 AM

## 2018-10-21 NOTE — ED Notes (Signed)
Patient transported to CT 

## 2018-10-21 NOTE — ED Provider Notes (Signed)
[facility] EMERGENCY DEPARTMENT Provider Note   CSN: [id number] Arrival date & time: 10/21/18  7619    History   Chief Complaint Chief Complaint  Patient presents with   Atrial Fibrillation    HPI Martin Conner is a 85 y.o. male.     Patient is an 85 year old male who presents with lower abdominal pain.  He has an indwelling Foley catheter.  He recently had a changed out 2 days ago.  Since then, he has had increasing pain to his lower abdomen and between his legs.  He was seen here yesterday and there was concern that his catheter was not draining although it seemed to be draining normally in the emergency department.  He was noted to have a urinary tract infection was started on antibiotics.  His urine was sent for culture.  He came back this morning because he still having significant pain to his genitalia and lower abdomen.  He feels achy all over.  He denies any known fevers.  No vomiting.     Past Medical History:  Diagnosis Date   Fracture of femoral neck, right ([place]) 07/10/2015   s/p mechanical fall   Hyperlipidemia    Hypertensive heart disease    Lumbar disc disease    Osteoarthritis    Permanent atrial fibrillation    a. 07/2012 Echo: EF 55-60%, no rwma, mildly dil RA/LA.   Syncope    a. 07/2012 - unclear etiology-->resulted in right tib/fib fx req ORIF/intramedullary nail of tibia.    Patient Active Problem List   Diagnosis Date Noted   Blood clotting disorder ([place]) 10/06/2018   Urinary retention 10/06/2018   Hypotension 50/93/2671   Acute metabolic encephalopathy 24/58/0998   Complicated UTI (urinary tract infection) 09/23/2018   Multiple myeloma ([place]) 07/19/2018   Counseling regarding advance care planning and goals of care 07/19/2018   MGUS (monoclonal gammopathy of unknown significance) 05/17/2018   Dementia ([place]) 04/20/2018   Memory loss 04/17/2017   S/P total hip arthroplasty 07/30/2015   Complete heart block ([place])  05/13/2015   Hypertensive heart disease    Chronic atrial fibrillation    Hyperlipidemia    Advanced directives, counseling/discussion 02/03/2014   Constipation 08/10/2012   Routine general medical examination at a health care facility 01/30/2012   MITRAL REGURGITATION 06/05/2009   Osteoarthrosis, generalized, involving multiple sites 01/20/2008   [place] DISEASE, LUMBAR SPINE 08/09/2007   Essential hypertension, benign 05/26/2007    Past Surgical History:  Procedure Laterality Date   ANKLE SURGERY     right ankle   TIBIA IM NAIL INSERTION Right 07/21/2012   Procedure: INTRAMEDULLARY (IM) NAIL TIBIAL;  Surgeon: Johnny Bridge, MD;  Location: [facility];  Service: Orthopedics;  Laterality: Right;   TOTAL HIP ARTHROPLASTY     left   TOTAL HIP ARTHROPLASTY Right 07/11/2015   Procedure: TOTAL HIP ARTHROPLASTY;  Surgeon: Marchia Bond, MD;  Location: [facility];  Service: Orthopedics;  Laterality: Right;        Home Medications    Prior to Admission medications   Medication Sig Start Date End Date Taking? Authorizing Provider  Acetaminophen (TYLENOL PO) Take 500 mg by mouth daily as needed (pain). Pt unsure of strength    Yes [provider]  cephALEXin (KEFLEX) 500 MG capsule Take 1 capsule (500 mg total) by mouth 2 (two) times daily for 7 days. 10/20/18 10/27/18 Yes Gareth Morgan, MD  ibuprofen (ADVIL) 200 MG tablet Take 400 mg by mouth every 6 (six)  hours as needed for moderate pain.   Yes [provider]  ondansetron (ZOFRAN) 8 MG tablet Take 1 tablet (8 mg total) by mouth 2 (two) times daily as needed (Nausea or vomiting). 07/19/18  Yes Brunetta Genera, MD  polyethylene glycol (MIRALAX / GLYCOLAX) 17 g packet Take 17 g by mouth daily.   Yes [provider]  sennosides-docusate sodium (SENOKOT-S) 8.6-50 MG tablet Take 2 tablets by mouth daily. Patient taking differently: Take 2 tablets by mouth as needed for constipation.  07/11/15  Yes  Marchia Bond, MD  tamsulosin (FLOMAX) 0.4 MG CAPS capsule Take 1 capsule (0.4 mg total) by mouth daily after supper. 09/12/18  Yes Dorie Rank, MD    Family History Family History  Problem Relation Age of Onset   Cancer Mother    Hyperlipidemia Sister    Hypertension Sister     Social History Social History   Tobacco Use   Smoking status: Never Smoker   Smokeless tobacco: Never Used  Substance Use Topics   Alcohol use: Yes    Alcohol/week: 12.0 standard drinks    Types: 12 Cans of beer per week    Comment: 2 beers several x/wk and occas cocktail on top of it.   Drug use: No     Allergies   Patient has no known allergies.   Review of Systems Review of Systems  Constitutional: Positive for fatigue. Negative for chills, diaphoresis and fever.  HENT: Negative for congestion, rhinorrhea and sneezing.   Eyes: Negative.   Respiratory: Negative for cough, chest tightness and shortness of breath.   Cardiovascular: Negative for chest pain and leg swelling.  Gastrointestinal: Positive for abdominal pain. Negative for blood in stool, diarrhea, nausea and vomiting.  Genitourinary: Negative for difficulty urinating, flank pain, frequency and hematuria.  Musculoskeletal: Positive for myalgias. Negative for arthralgias and back pain.  Skin: Negative for rash.  Neurological: Negative for dizziness, speech difficulty, weakness, numbness and headaches.     Physical Exam Updated Vital Signs BP 104/62    Pulse 91    Temp 99.9 F (37.7 C) (Oral)    Resp 19    Ht _0  (1.854 m)    Wt 71.7 kg    SpO2 95%    BMI 20.85 kg/m   Physical Exam Constitutional:      Appearance: He is well-developed.  HENT:     Head: Normocephalic and atraumatic.  Eyes:     Pupils: Pupils are equal, round, and reactive to light.  Neck:     Musculoskeletal: Normal range of motion and neck supple.  Cardiovascular:     Rate and Rhythm: Normal rate and regular rhythm.     Heart sounds: Normal heart  sounds.  Pulmonary:     Effort: Pulmonary effort is normal. No respiratory distress.     Breath sounds: Normal breath sounds. No wheezing or rales.  Chest:     Chest wall: No tenderness.  Abdominal:     General: Bowel sounds are normal.     Palpations: Abdomen is soft.     Tenderness: There is abdominal tenderness. There is no guarding or rebound.     Comments: Mild tenderness across his lower abdomen.,  Patient appears to be very uncomfortable.  Genitourinary:    Comments: Patient has an indwelling Foley catheter.  There is a large amount of urine that has soaked the diaper that he is wearing.  There is some mild erythema to his scrotal sac but no pain on palpation  of his testicles or scrotum.  No abscess or other signs of infection in this area. Musculoskeletal: Normal range of motion.  Lymphadenopathy:     Cervical: No cervical adenopathy.  Skin:    General: Skin is warm and dry.     Findings: No rash.  Neurological:     Mental Status: He is alert and oriented to person, place, and time.      ED Treatments / Results  Labs (all labs ordered are listed, but only abnormal results are displayed) Labs Reviewed  LACTIC ACID, PLASMA - Abnormal; Notable for the following components:      Result Value   Lactic Acid, Venous 2.2 (*)    All other components within normal limits  LACTIC ACID, PLASMA - Abnormal; Notable for the following components:   Lactic Acid, Venous 2.0 (*)    All other components within normal limits  CBC WITH DIFFERENTIAL/PLATELET - Abnormal; Notable for the following components:   WBC 18.6 (*)    RBC 2.89 (*)    Hemoglobin 9.9 (*)    HCT 29.0 (*)    MCV 100.3 (*)    MCH 34.3 (*)    RDW 16.0 (*)    Neutro Abs 17.3 (*)    Lymphs Abs 0.6 (*)    Abs Immature Granulocytes 0.58 (*)    All other components within normal limits  COMPREHENSIVE METABOLIC PANEL - Abnormal; Notable for the following components:   CO2 21 (*)    BUN 28 (*)    Creatinine, Ser 1.33  (*)    Albumin 2.3 (*)    GFR calc non Af Amer 47 (*)    GFR calc Af Amer 55 (*)    All other components within normal limits  SARS CORONAVIRUS 2 (HOSPITAL ORDER, [facility])  CULTURE, BLOOD (ROUTINE X 2)  CULTURE, BLOOD (ROUTINE X 2)    EKG EKG Interpretation  Date/Time:  Thursday October 21 2018 06:37:18 EDT Ventricular Rate:  102 PR Interval:    QRS Duration: 114 QT Interval:  356 QTC Calculation: 464 R Axis:   41 Text Interpretation:  Atrial fibrillation Incomplete right bundle branch block similar to prior EKG Confirmed by Malvin Johns [phone]) on 10/21/2018 7:05:04 AM   Radiology Ct Renal Stone Study  Result Date: 10/21/2018 CLINICAL DATA:  Left abdominal and pelvis pain for 3 days EXAM: CT ABDOMEN AND PELVIS WITHOUT CONTRAST TECHNIQUE: Multidetector CT imaging of the abdomen and pelvis was performed following the standard protocol without IV contrast. COMPARISON:  PET-CT 07/20/2018 FINDINGS: Lower chest:  Coronary atherosclerosis. Hepatobiliary: No focal liver abnormality.Layering high-density in the gallbladder fundus, sludge versus calculi. No evidence of pericholecystic inflammation. Pancreas: Unremarkable. Spleen: Unremarkable. Adrenals/Urinary Tract: Negative adrenals. No hydronephrosis or ureteral stone. Bilateral renal cystic densities. 15 mm stone in the lower pole right kidney. Bladder is largely obscured by streak artifact from the hips. A Foley catheter drains the bladder. Stomach/Bowel: No obstruction. No appendicitis. Multiple colonic diverticula. Vascular/Lymphatic: No acute vascular abnormality. Extensive atherosclerotic calcification. No mass or adenopathy. Reproductive:Negative Other: No ascites or pneumoperitoneum. Musculoskeletal: Spondylosis.  Bilateral hip arthroplasty. IMPRESSION: 1. No acute finding. 2. Bilateral nephrolithiasis. 3. Extensive colonic diverticulosis. 4. Gallbladder sludge versus calculi without evidence of cholecystitis.  Electronically Signed   By: Monte Fantasia M.D.   On: 10/21/2018 10:11    Procedures Procedures (including critical care time)  Medications Ordered in ED Medications  cefTRIAXone (ROCEPHIN) 1 g in sodium chloride 0.9 % 100 mL IVPB (0 g  Intravenous Stopped 10/21/18 0935)  sodium chloride 0.9 % bolus 500 mL (500 mLs Intravenous New Bag/Given 10/21/18 1039)     Initial Impression / Assessment and Plan / ED Course  I have reviewed the triage vital signs and the nursing notes.  Pertinent labs & imaging results that were available during my care of the patient were reviewed by me and considered in my medical decision making (see chart for details).        Patient is 85 year old male who presents with lower abdominal pain.  His Foley catheter seems to be leaking and this was changed out.  It is draining well.  His pain has improved.  A CT scan was performed which shows no acute abnormalities.  His labs show markedly elevated white count.  His blood pressure is soft in the 28F systolic.  He was gently hydrated.  He was given IV Rocephin.  His urine was sent for culture yesterday.  Blood cultures were obtained today.  He is mildly tachycardic.  He complains of generalized fatigue.  I feel that he would benefit from inpatient treatment.  I spoke with Dr. Tamala Julian he will admit the patient.  Final Clinical Impressions(s) / ED Diagnoses   Final diagnoses:  Chronic atrial fibrillation  Urinary tract infection without hematuria, site unspecified    ED Discharge Orders    None       Malvin Johns, MD 10/21/18 1058

## 2018-10-21 NOTE — ED Triage Notes (Signed)
Pt brought to ED by [facility] from home for c/o groin pain, was seen yesterday for same and sent home with a urinary catheter and PO ABX, pt woke up today with this groin pain and new onset of Afib. BP 114/46, HR 100-130, SPO2 100% RA, R-20, CBG 86.

## 2018-10-21 NOTE — Progress Notes (Signed)
CRITICAL VALUE ALERT  Critical Value:  Lactic 2.6  Date & Time Notied:  10/21/2018  Provider Notified: [place]  Orders Received/Actions taken: Awaiting orders

## 2018-10-21 NOTE — Telephone Encounter (Signed)
Oral Oncology Pharmacist Encounter  Received new prescription for Ninlaro (ixazomib) for the treatment of multiple myeloma not having achieved remission in conjunction with dexamethasone, planned duration until adequate disease control or unacceptable toxicity. Patient has been on treatment with bortezomib and dexamethasone. Treatment change due to administration ease and ability to avoid office visits.  Ninlaro is planned to be administered at 106m once weekly for 3 weeks on, 1 week off, repeated every 4 weeks  Labs from 10/21/18 assessed, OK for treatment initiation. SCr=1.33, est CrCl ~35 mL/min Manufacturer recommends dose reduction to 3 mg once weekly dosing with CrCl < 30 mL/min Noted Ninlaro dose has been decreased for increased toleration  Current medication list in Epic reviewed, no DDIs with Ninlaro identified.  Acyclovir has fallen off of patient's medication list. I will ensure that patient continues on this supportive care medication for VZV prophylaxis.  [facility]  Patient is currently admitted with presumed UTI. Plan for treatment to change to Ninlaro and dexamethasone in 1-2 weeks, pending d/c from hospital and complete resolution of acute issues.  Oral Oncology Clinic will continue to follow for insurance authorization, copayment issues, initial counseling and start date.  JJohny Drilling PharmD, BCPS, BCOP  10/21/2018 3:57 PM Oral Oncology Clinic [phone]

## 2018-10-21 NOTE — ED Notes (Signed)
2 sets of blood cultures sent to main lab.

## 2018-10-21 NOTE — Progress Notes (Signed)
Martin Conner is a 85 y.o. male patient admitted from ED awake, alert - oriented  X 4 - no acute distress noted.  VSS - Blood pressure 117/77, pulse 89, temperature 97.8 F (36.6 C), temperature source Oral, resp. rate 19, height 6\' 1"  (1.854 m), weight 73.4 kg, SpO2 92 %.    IV in place, occlusive dsg intact without redness.  Orientation to room, and floor completed with information packet given to patient/family.  Patient declined safety video at this time.  Admission INP armband ID verified with patient/family, and in place.   SR up x 2, fall assessment complete, with patient and family able to verbalize understanding of risk associated with falls, and verbalized understanding to call nsg before up out of bed.  Call light within reach, patient able to voice, and demonstrate understanding.  Skin assessment complete and documented.    Will cont to eval and treat per MD orders.  Valorie Roosevelt, RN 10/21/2018 9:05 PM

## 2018-10-21 NOTE — Telephone Encounter (Signed)
Patient daughter Ria Comment called to inform office that father was seen at [facility] ED yesterday and d/c'd around 11pm to home.  Was taken by EMS early this morning and now admitted to [facility].  Family wants to know if Dr. Irene Limbo has ordered the oral chemotherapy or was the patient going to stay on the injection.  Advised them that MD out of office today and tomorrow, but that he would be asked on return and they will be contacted. Daughter verbalized understanding.

## 2018-10-22 ENCOUNTER — Telehealth: Payer: Self-pay

## 2018-10-22 DIAGNOSIS — A419 Sepsis, unspecified organism: Secondary | ICD-10-CM

## 2018-10-22 DIAGNOSIS — D72829 Elevated white blood cell count, unspecified: Secondary | ICD-10-CM

## 2018-10-22 DIAGNOSIS — N390 Urinary tract infection, site not specified: Secondary | ICD-10-CM

## 2018-10-22 DIAGNOSIS — F0390 Unspecified dementia without behavioral disturbance: Secondary | ICD-10-CM

## 2018-10-22 DIAGNOSIS — N179 Acute kidney failure, unspecified: Secondary | ICD-10-CM

## 2018-10-22 LAB — CBC WITH DIFFERENTIAL/PLATELET
Abs Immature Granulocytes: 0.17 K/uL — ABNORMAL HIGH (ref 0.00–0.07)
Basophils Absolute: 0 K/uL (ref 0.0–0.1)
Basophils Relative: 0 %
Eosinophils Absolute: 0 K/uL (ref 0.0–0.5)
Eosinophils Relative: 0 %
HCT: 34.4 % — ABNORMAL LOW (ref 39.0–52.0)
Hemoglobin: 11.5 g/dL — ABNORMAL LOW (ref 13.0–17.0)
Immature Granulocytes: 1 %
Lymphocytes Relative: 8 %
Lymphs Abs: 2 K/uL (ref 0.7–4.0)
MCH: 34 pg (ref 26.0–34.0)
MCHC: 33.4 g/dL (ref 30.0–36.0)
MCV: 101.8 fL — ABNORMAL HIGH (ref 80.0–100.0)
Monocytes Absolute: 0.4 K/uL (ref 0.1–1.0)
Monocytes Relative: 2 %
Neutro Abs: 22.6 K/uL — ABNORMAL HIGH (ref 1.7–7.7)
Neutrophils Relative %: 89 %
Platelets: 176 K/uL (ref 150–400)
RBC: 3.38 MIL/uL — ABNORMAL LOW (ref 4.22–5.81)
RDW: 17 % — ABNORMAL HIGH (ref 11.5–15.5)
WBC: 25.2 K/uL — ABNORMAL HIGH (ref 4.0–10.5)
nRBC: 0 % (ref 0.0–0.2)

## 2018-10-22 LAB — BASIC METABOLIC PANEL
Anion gap: 11 (ref 5–15)
BUN: 23 mg/dL (ref 8–23)
CO2: 17 mmol/L — ABNORMAL LOW (ref 22–32)
Calcium: 9.3 mg/dL (ref 8.9–10.3)
Chloride: 108 mmol/L (ref 98–111)
Creatinine, Ser: 1.16 mg/dL (ref 0.61–1.24)
GFR calc Af Amer: >60 mL/min (ref >60)
GFR calc non Af Amer: 56 mL/min — ABNORMAL LOW (ref >60)
Glucose, Bld: 78 mg/dL (ref 70–99)
Potassium: 4.9 mmol/L (ref 3.5–5.1)
Sodium: 136 mmol/L (ref 135–145)

## 2018-10-22 NOTE — Telephone Encounter (Signed)
Oral Oncology Patient Advocate Encounter  Received notification from [employer] that prior authorization for Martin Conner is required.  PA submitted on CoverMyMeds Key [id number] Status is pending  Oral Oncology Clinic will continue to follow.  [place] Patient [place] Phone [phone] Fax [phone] 10/22/2018    8:55 AM

## 2018-10-22 NOTE — Telephone Encounter (Signed)
Oral Oncology Patient Advocate Encounter  Prior Authorization for Martin Conner has been approved.    PA# [id number] Effective dates: 09/22/18 through 10/21/21  Oral Oncology Clinic will continue to follow.   [place] Patient [place] Phone [phone] Fax [phone] 10/22/2018    8:56 AM

## 2018-10-22 NOTE — Telephone Encounter (Signed)
Oral Oncology Patient Advocate Encounter  Was successful in securing patient a $11000 grant from [employer] to provide copayment coverage for [employer]. This will keep the out of pocket expense at $0.   I have spoken with the patient..   The billing information is as follows and has been shared with [place].   Member ID: [id number] Group ID: [id number] RxBin: [id number] Dates of Eligibility: 09/22/18 through 09/21/19  [place] Patient [place] Phone [phone] Fax [phone] 10/22/2018    9:57 AM

## 2018-10-22 NOTE — Progress Notes (Signed)
Patient ID: Martin Conner, male   DOB: [birth year], 84 y.o.   MRN: [id number]  PROGRESS NOTE    Martin Conner  ZSW:[id number] DOB: April 04, 1927 DOA: 10/21/2018 PCP: Venia Carbon, MD   Brief Narrative:  85 year old male with history of multiple myeloma, permanent atrial fibrillation, hypertension, hyperlipidemia, dementia, chronic kidney disease stage III presented with penile and testicle pain for last 2 days after his catheter change 2 days ago by home health nurse.  He was found to have low-grade temperature in the ED with leukocytosis of 18.6 and urinalysis consistent with UTI.  Initial COVID-19 testing was negative.  Foley catheter was switched in the ED.  CT of the abdomen and pelvis showed no acute findings.  He was started on IV fluids and IV antibiotics.  Assessment & Plan:  Sepsis: Present on admission, secondary to UTI Complicated urinary tract infection, probably related to chronic indwelling Foley catheter Leukocytosis -Foley catheter was changed in the emergency department. -Continue Rocephin.  Follow blood cultures.  Urine culture from 10/20/2018 done as an outpatient is growing Enterobacter aerogenes which is resistant to cefazolin. -Leukocytosis worsening today. -CT of the abdomen pelvis showed no acute abnormalities on 10/21/2018 -Continue IV fluids for now.  Acute kidney injury -Presented with creatinine of 1.33.  Baseline creatinine around 0.8.  Improving.  Monitor.  Permanent atrial fibrillation -Rate controlled.  Patient has been off of Eliquis for the last year for unknown reason.  I will defer Eliquis decision to primary care provider.  History of multiple myeloma -Followed by Dr. Irene Limbo of oncology and received bortezomib infusion on 10/18/2018.  Outpatient follow-up with Dr. Irene Limbo  Dementia -Monitor mental status and fall precautions.  Anemia of chronic disease -Probably from myeloma. -Hemoglobin stable.  Monitor  DVT prophylaxis: SCDs. Code Status: Spoke to wife and  she agrees for patient to be DNR. Family Communication: Spoke to wife/[name] on phone on 10/21/2018 Disposition Plan: Home in 1 to 2 days if clinically improves  Consultants: None  Procedures: None  Antimicrobials: Rocephin from 10/21/2018 onwards   Subjective: Patient seen and examined at bedside.  He is awake, hard of hearing, slightly confused.  No overnight fever, vomiting reported.  Objective: Vitals:   10/21/18 1739 10/21/18 1949 10/21/18 2025 10/22/18 0624  BP: 91/65 112/78 117/77 125/76  Pulse: 69  89 76  Resp: 16 19    Temp:   97.8 F (36.6 C) 97.7 F (36.5 C)  TempSrc:   Oral Oral  SpO2: 97%  92% 100%  Weight:   73.4 kg   Height:   6' 1"  (1.854 m)     Intake/Output Summary (Last 24 hours) at 10/22/2018 1019 Last data filed at 10/22/2018 0339 Gross per 24 hour  Intake 3476.5 ml  Output 450 ml  Net 3026.5 ml   Filed Weights   10/21/18 0629 10/21/18 2025  Weight: 71.7 kg 73.4 kg    Examination:  General exam: Appears calm and comfortable.  Poor historian. Respiratory system: Bilateral decreased breath sounds at bases Cardiovascular system: S1 & S2 heard, Rate controlled Gastrointestinal system: Abdomen is nondistended, soft and nontender. Normal bowel sounds heard. Extremities: No cyanosis, clubbing; trace edema Central nervous system: Awake, confused. No focal neurological deficits. Moving extremities Skin: No rashes, lesions or ulcers Psychiatry: Could not be assessed because of mental status Genitourinary: Has Foley catheter    Data Reviewed: I have personally reviewed following labs and imaging studies  CBC: Recent Labs  Lab 10/18/18 0855 10/20/18 1617 10/21/18 0651 10/21/18  1300 10/22/18 0256  WBC 4.9 19.0* 18.6*  --  25.2*  NEUTROABS 1.9 17.3* 17.3*  --  22.6*  HGB 11.6* 12.0* 9.9* 9.5* 11.5*  HCT 33.9* 36.3* 29.0* 28.3* 34.4*  MCV 98.3 103.4* 100.3*  --  101.8*  PLT 210 203 190  --  962   Basic Metabolic Panel: Recent Labs  Lab  10/18/18 0855 10/20/18 1617 10/21/18 0651 10/22/18 0256  NA 137 137 135 136  K 3.9 3.8 4.1 4.9  CL 100 103 104 108  CO2 24 22 21* 17*  GLUCOSE 97 87 76 78  BUN 14 29* 28* 23  CREATININE 1.17 1.35* 1.33* 1.16  CALCIUM 10.2 9.4 9.3 9.3   GFR: Estimated Creatinine Clearance: 45.7 mL/min (by C-G formula based on SCr of 1.16 mg/dL). Liver Function Tests: Recent Labs  Lab 10/18/18 0855 10/21/18 0651  AST 14* 18  ALT 9 13  ALKPHOS 45 46  BILITOT 0.5 0.8  PROT 9.5* 8.1  ALBUMIN 3.0* 2.3*   No results for input(s): LIPASE, AMYLASE in the last 168 hours. No results for input(s): AMMONIA in the last 168 hours. Coagulation Profile: No results for input(s): INR, PROTIME in the last 168 hours. Cardiac Enzymes: No results for input(s): CKTOTAL, CKMB, CKMBINDEX, TROPONINI in the last 168 hours. BNP (last 3 results) No results for input(s): PROBNP in the last 8760 hours. HbA1C: No results for input(s): HGBA1C in the last 72 hours. CBG: No results for input(s): GLUCAP in the last 168 hours. Lipid Profile: No results for input(s): CHOL, HDL, LDLCALC, TRIG, CHOLHDL, LDLDIRECT in the last 72 hours. Thyroid Function Tests: No results for input(s): TSH, T4TOTAL, FREET4, T3FREE, THYROIDAB in the last 72 hours. Anemia Panel: No results for input(s): VITAMINB12, FOLATE, FERRITIN, TIBC, IRON, RETICCTPCT in the last 72 hours. Sepsis Labs: Recent Labs  Lab 10/21/18 9528 10/21/18 0853 10/21/18 1230 10/21/18 2050  LATICACIDVEN 2.2* 2.0* 1.9 2.6*    Recent Results (from the past 240 hour(s))  Urine culture     Status: Abnormal (Preliminary result)   Collection Time: 10/20/18  8:32 PM   Specimen: Urine, Clean Catch  Result Value Ref Range Status   Specimen Description   Final    URINE, CLEAN CATCH Performed at [facility], [place] [address], [place], [place] [address]    Special Requests   Final    NONE Performed at [facility], [place] [address] [place], [place] [address]    Culture (A)  Final    >=100,000 COLONIES/mL ENTEROBACTER AEROGENES CULTURE REINCUBATED FOR BETTER GROWTH Performed at [facility], [place] [address] [place], [place] [address]    Report Status PENDING  Incomplete   Organism ID, Bacteria ENTEROBACTER AEROGENES (A)  Final      Susceptibility   Enterobacter aerogenes - MIC*    CEFAZOLIN RESISTANT Resistant     CEFTRIAXONE <=1 SENSITIVE Sensitive     CIPROFLOXACIN <=0.25 SENSITIVE Sensitive     GENTAMICIN <=1 SENSITIVE Sensitive     IMIPENEM <=0.25 SENSITIVE Sensitive     NITROFURANTOIN 128 RESISTANT Resistant     TRIMETH/SULFA <=20 SENSITIVE Sensitive     PIP/TAZO <=4 SENSITIVE Sensitive     * >=100,000 COLONIES/mL ENTEROBACTER AEROGENES  SARS Coronavirus 2 (CEPHEID - Performed in [place] hospital lab), Hosp Order     Status: None   Collection Time: 10/21/18  8:53 AM   Specimen: Nasopharyngeal Swab  Result Value Ref Range Status   SARS Coronavirus 2 NEGATIVE  NEGATIVE Final    Comment: (NOTE) If result is NEGATIVE SARS-CoV-2 target nucleic acids are NOT DETECTED. The SARS-CoV-2 RNA is generally detectable in upper and lower  respiratory specimens during the acute phase of infection. The lowest  concentration of SARS-CoV-2 viral copies this assay can detect is 250  copies / mL. A negative result does not preclude SARS-CoV-2 infection  and should not be used as the sole basis for treatment or other  patient management decisions.  A negative result may occur with  improper specimen collection / handling, submission of specimen other  than nasopharyngeal swab, presence of viral mutation(s) within the  areas targeted by this assay, and inadequate number of viral copies  (<250 copies / mL). A negative result must be combined with clinical  observations, patient history, and epidemiological information. If result is POSITIVE SARS-CoV-2 target nucleic acids are DETECTED. The SARS-CoV-2 RNA is  generally detectable in upper and lower  respiratory specimens dur ing the acute phase of infection.  Positive  results are indicative of active infection with SARS-CoV-2.  Clinical  correlation with patient history and other diagnostic information is  necessary to determine patient infection status.  Positive results do  not rule out bacterial infection or co-infection with other viruses. If result is PRESUMPTIVE POSTIVE SARS-CoV-2 nucleic acids MAY BE PRESENT.   A presumptive positive result was obtained on the submitted specimen  and confirmed on repeat testing.  While 2019 novel coronavirus  (SARS-CoV-2) nucleic acids may be present in the submitted sample  additional confirmatory testing may be necessary for epidemiological  and / or clinical management purposes  to differentiate between  SARS-CoV-2 and other Sarbecovirus currently known to infect humans.  If clinically indicated additional testing with an alternate test  methodology [phone]) is advised. The SARS-CoV-2 RNA is generally  detectable in upper and lower respiratory sp ecimens during the acute  phase of infection. The expected result is Negative. Fact Sheet for Patients:  StrictlyIdeas.no Fact Sheet for Healthcare Providers: BankingDealers.co.za This test is not yet approved or cleared by the [place] FDA and has been authorized for detection and/or diagnosis of SARS-CoV-2 by FDA under an Emergency Use Authorization (EUA).  This EUA will remain in effect (meaning this test can be used) for the duration of the COVID-19 declaration under Section 564(b)(1) of the Act, 21 U.S.C. section 360bbb-3(b)(1), unless the authorization is terminated or revoked sooner. Performed at [facility], [place] [address] [place], [place] [address]          Radiology Studies: Ct Renal Stone Study  Result Date: 10/21/2018 CLINICAL DATA:  Left abdominal and pelvis pain for 3  days EXAM: CT ABDOMEN AND PELVIS WITHOUT CONTRAST TECHNIQUE: Multidetector CT imaging of the abdomen and pelvis was performed following the standard protocol without IV contrast. COMPARISON:  PET-CT 07/20/2018 FINDINGS: Lower chest:  Coronary atherosclerosis. Hepatobiliary: No focal liver abnormality.Layering high-density in the gallbladder fundus, sludge versus calculi. No evidence of pericholecystic inflammation. Pancreas: Unremarkable. Spleen: Unremarkable. Adrenals/Urinary Tract: Negative adrenals. No hydronephrosis or ureteral stone. Bilateral renal cystic densities. 15 mm stone in the lower pole right kidney. Bladder is largely obscured by streak artifact from the hips. A Foley catheter drains the bladder. Stomach/Bowel: No obstruction. No appendicitis. Multiple colonic diverticula. Vascular/Lymphatic: No acute vascular abnormality. Extensive atherosclerotic calcification. No mass or adenopathy. Reproductive:Negative Other: No ascites or pneumoperitoneum. Musculoskeletal: Spondylosis.  Bilateral hip arthroplasty. IMPRESSION: 1. No acute finding. 2. Bilateral nephrolithiasis. 3. Extensive colonic diverticulosis. 4. Gallbladder sludge versus  calculi without evidence of cholecystitis. Electronically Signed   By: Monte Fantasia M.D.   On: 10/21/2018 10:11        Scheduled Meds: . polyethylene glycol  17 g Oral Daily  . polyvinyl alcohol  1 drop Both Eyes BID  . tamsulosin  0.4 mg Oral QPC supper   Continuous Infusions: . sodium chloride 75 mL/hr at 10/22/18 0418  . cefTRIAXone (ROCEPHIN)  IV       LOS: 1 day        Aline August, MD [facility] 10/22/2018, 10:19 AM

## 2018-10-23 LAB — CBC WITH DIFFERENTIAL/PLATELET
Abs Immature Granulocytes: 0.04 10*3/uL (ref 0.00–0.07)
Basophils Absolute: 0 10*3/uL (ref 0.0–0.1)
Basophils Relative: 0 %
Eosinophils Absolute: 0 10*3/uL (ref 0.0–0.5)
Eosinophils Relative: 0 %
HCT: 32.7 % — ABNORMAL LOW (ref 39.0–52.0)
Hemoglobin: 11 g/dL — ABNORMAL LOW (ref 13.0–17.0)
Immature Granulocytes: 0 %
Lymphocytes Relative: 12 %
Lymphs Abs: 1.3 10*3/uL (ref 0.7–4.0)
MCH: 33.8 pg (ref 26.0–34.0)
MCHC: 33.6 g/dL (ref 30.0–36.0)
MCV: 100.6 fL — ABNORMAL HIGH (ref 80.0–100.0)
Monocytes Absolute: 0.2 10*3/uL (ref 0.1–1.0)
Monocytes Relative: 2 %
Neutro Abs: 9.1 10*3/uL — ABNORMAL HIGH (ref 1.7–7.7)
Neutrophils Relative %: 86 %
Platelets: 163 10*3/uL (ref 150–400)
RBC: 3.25 MIL/uL — ABNORMAL LOW (ref 4.22–5.81)
RDW: 16.5 % — ABNORMAL HIGH (ref 11.5–15.5)
WBC: 10.7 10*3/uL — ABNORMAL HIGH (ref 4.0–10.5)
nRBC: 0 % (ref 0.0–0.2)

## 2018-10-23 LAB — BASIC METABOLIC PANEL
Anion gap: 9 (ref 5–15)
BUN: 12 mg/dL (ref 8–23)
CO2: 20 mmol/L — ABNORMAL LOW (ref 22–32)
Calcium: 9.3 mg/dL (ref 8.9–10.3)
Chloride: 105 mmol/L (ref 98–111)
Creatinine, Ser: 0.88 mg/dL (ref 0.61–1.24)
GFR calc Af Amer: >60 mL/min (ref >60)
GFR calc non Af Amer: >60 mL/min (ref >60)
Glucose, Bld: 90 mg/dL (ref 70–99)
Potassium: 4.3 mmol/L (ref 3.5–5.1)
Sodium: 134 mmol/L — ABNORMAL LOW (ref 135–145)

## 2018-10-23 LAB — MAGNESIUM: Magnesium: 1.7 mg/dL (ref 1.7–2.4)

## 2018-10-23 MED ORDER — CIPROFLOXACIN HCL 500 MG PO TABS: 500.0000 mg | ORAL_TABLET | Freq: Two times a day (BID) | ORAL | 0 refills | Status: AC

## 2018-10-23 MED ORDER — SENNA-DOCUSATE SODIUM 8.6-50 MG PO TABS: 2.0000 | ORAL_TABLET | ORAL | Status: AC | PRN

## 2018-10-23 NOTE — Progress Notes (Signed)
Patient discharged via wheelchair to home with wife. AVS reviewed with wife over the phone and verified that [facility] has medication available for pickup until 430pm. Patient urinary cath bag changed to leg bag per wife request and given a new drainage bag to take home in case needs back up.

## 2018-10-23 NOTE — Discharge Summary (Signed)
Physician Discharge Summary  BRAZEN DOMANGUE GLO:[id number] DOB: 1927-04-24 DOA: 10/21/2018  PCP: Venia Carbon, MD  Admit date: 10/21/2018 Discharge date: 10/23/2018  Admitted From: Home Disposition: Home  Recommendations for Outpatient Follow-up:  1. Follow up with PCP in 1 week with repeat CBC/BMP 2. Outpatient follow-up with urology and oncology 3. Follow up in ED if symptoms worsen or new appear   Home Health: Home health PT/RN Equipment/Devices: Foley catheter was changed in the ED on admission  Discharge Condition: Guarded CODE STATUS: Full Diet recommendation: Heart healthy  Brief/Interim Summary: 85 year old male with history of multiple myeloma, permanent atrial fibrillation, hypertension, hyperlipidemia, dementia, chronic kidney disease stage III presented with penile and testicle pain for last 2 days after his catheter change 2 days ago by home health nurse.  He was found to have low-grade temperature in the ED with leukocytosis of 18.6 and urinalysis consistent with UTI.  Initial COVID-19 testing was negative.  Foley catheter was switched in the ED.  CT of the abdomen and pelvis showed no acute findings.  He was started on IV fluids and IV antibiotics.  During the hospitalization, his condition has improved.  Leukocytosis has resolved.  She will be discharged home on oral antibiotics.  Discharge Diagnoses:   Sepsis: Present on admission, secondary to UTI Complicated urinary tract infection, probably related to chronic indwelling Foley catheter Leukocytosis -Foley catheter was changed in the emergency department. -Currently on Rocephin.   Blood cultures negative so far.  Urine culture from 10/20/2018 done as an outpatient is growing Enterobacter aerogenes which is resistant to cefazolin. -Leukocytosis has resolved. -CT of the abdomen pelvis showed no acute abnormalities on 10/21/2018 -Treated with IV fluids as well. -Sepsis has resolved -We will discharge home today on oral  ciprofloxacin for 5 more days.  Outpatient follow-up with PCP and urology.  Acute kidney injury -Presented with creatinine of 1.33.  Baseline creatinine around 0.8.  Resolved.  Treated with IV fluids.  Permanent atrial fibrillation -Rate controlled.  Patient has been off of Eliquis for the last year for unknown reason.  I will defer Eliquis decision to primary care provider.  History of multiple myeloma -Followed by Dr. Irene Limbo of oncology and received bortezomib infusion on 10/18/2018.  Outpatient follow-up with Dr. Irene Limbo  Dementia -Outpatient follow-up.  Anemia of chronic disease -Probably from myeloma. -Hemoglobin stable.    Outpatient follow-up.  Generalized deconditioning -Patient might benefit from home health PT/RN. Discharge Instructions  Discharge Instructions    Diet - low sodium heart healthy   Complete by: As directed    Increase activity slowly   Complete by: As directed      Allergies as of 10/23/2018   No Known Allergies     Medication List    STOP taking these medications   cephALEXin 500 MG capsule Commonly known as: KEFLEX     TAKE these medications   ciprofloxacin 500 MG tablet Commonly known as: Cipro Take 1 tablet (500 mg total) by mouth 2 (two) times daily for 5 days.   ibuprofen 200 MG tablet Commonly known as: ADVIL Take 400 mg by mouth every 6 (six) hours as needed for moderate pain.   ixazomib citrate 3 MG capsule Commonly known as: Ninlaro Take 1 capsule (3 mg) by mouth weekly, 3 weeks on, 1 week off, repeat every 4 weeks. Take on an empty stomach 1hr before or 2hr after meals.   ondansetron 8 MG tablet Commonly known as: Zofran Take 1 tablet (8 mg total) by  mouth 2 (two) times daily as needed (Nausea or vomiting).   polyethylene glycol 17 g packet Commonly known as: MIRALAX / GLYCOLAX Take 17 g by mouth daily.   sennosides-docusate sodium 8.6-50 MG tablet Commonly known as: SENOKOT-S Take 2 tablets by mouth as needed for  constipation.   tamsulosin 0.4 MG Caps capsule Commonly known as: Flomax Take 1 capsule (0.4 mg total) by mouth daily after supper.   TYLENOL PO Take 500 mg by mouth daily as needed (pain). Pt unsure of strength      Follow-up Information    Viviana Simpler I, MD. Schedule an appointment as soon as possible for a visit in 1 week(s).   Specialties: Internal Medicine, Pediatrics Why: With repeat CBC/BMP Contact information: [place] [address] [phone]        Lucas Mallow, MD Follow up.   Specialty: Urology Why: Keep scheduled appointment Contact information: [place] [address] [phone]          No Known Allergies  Consultations:  None   Procedures/Studies: Ct Head Wo Contrast  Result Date: 09/23/2018 CLINICAL DATA:  85 year old male with altered mental status and weakness. Multiple myeloma. EXAM: CT HEAD WITHOUT CONTRAST TECHNIQUE: Contiguous axial images were obtained from the base of the skull through the vertex without intravenous contrast. COMPARISON:  09/07/2018 head CT, brain MRI 04/29/2018. FINDINGS: Brain: Chronic encephalomalacia in the posterior right MCA and the right PCA territories. Stable cerebral volume. Small area of cortical encephalomalacia in the more anterior right superior frontal gyrus on series 3, image 25. No midline shift, ventriculomegaly, mass effect, evidence of mass lesion, intracranial hemorrhage or evidence of cortically based acute infarction. Vascular: Extensive Calcified atherosclerosis at the skull base. No suspicious intracranial vascular hyperdensity. Skull: Stable, no lytic osseous lesion identified. Sinuses/Orbits: Visualized paranasal sinuses and mastoids are stable and well pneumatized. Other: Stable orbit and scalp soft tissues. 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 Electronically Signed    By: Genevie Ann M.D.   On: 09/23/2018 18:31   Ct Renal Stone Study  Result Date: 10/21/2018 CLINICAL DATA:  Left abdominal and pelvis pain for 3 days EXAM: CT ABDOMEN AND PELVIS WITHOUT CONTRAST TECHNIQUE: Multidetector CT imaging of the abdomen and pelvis was performed following the standard protocol without IV contrast. COMPARISON:  PET-CT 07/20/2018 FINDINGS: Lower chest:  Coronary atherosclerosis. Hepatobiliary: No focal liver abnormality.Layering high-density in the gallbladder fundus, sludge versus calculi. No evidence of pericholecystic inflammation. Pancreas: Unremarkable. Spleen: Unremarkable. Adrenals/Urinary Tract: Negative adrenals. No hydronephrosis or ureteral stone. Bilateral renal cystic densities. 15 mm stone in the lower pole right kidney. Bladder is largely obscured by streak artifact from the hips. A Foley catheter drains the bladder. Stomach/Bowel: No obstruction. No appendicitis. Multiple colonic diverticula. Vascular/Lymphatic: No acute vascular abnormality. Extensive atherosclerotic calcification. No mass or adenopathy. Reproductive:Negative Other: No ascites or pneumoperitoneum. Musculoskeletal: Spondylosis.  Bilateral hip arthroplasty. IMPRESSION: 1. No acute finding. 2. Bilateral nephrolithiasis. 3. Extensive colonic diverticulosis. 4. Gallbladder sludge versus calculi without evidence of cholecystitis. Electronically Signed   By: Monte Fantasia M.D.   On: 10/21/2018 10:11     Subjective: Patient seen and examined at bedside.  Awake and confused.  Feels better and wants to go home.  Discharge Exam: Vitals:   10/22/18 2141 10/23/18 0654  BP: (!) 158/94 (!) 143/87  Pulse: 83 85  Resp:  14  Temp: 98.4 F (  36.9 C) 97.7 F (36.5 C)  SpO2: 100% 98%    General: Pt is awake and confused.  Has indwelling Foley Cardiovascular: rate controlled, S1/S2 + Respiratory: bilateral decreased breath sounds at bases with some scattered crackles Abdominal: Soft, NT, ND, bowel sounds  + Extremities: Trace edema, no cyanosis    The results of significant diagnostics from this hospitalization (including imaging, microbiology, ancillary and laboratory) are listed below for reference.     Microbiology: Recent Results (from the past 240 hour(s))  Urine culture     Status: Abnormal (Preliminary result)   Collection Time: 10/20/18  8:32 PM   Specimen: Urine, Clean Catch  Result Value Ref Range Status   Specimen Description   Final    URINE, CLEAN CATCH Performed at [facility], [place] [address], [place], [place] [address]    Special Requests   Final    NONE Performed at [facility], [place] [address], [place], [place] [address]    Culture (A)  Final    >=100,000 COLONIES/mL ENTEROBACTER AEROGENES CULTURE REINCUBATED FOR BETTER GROWTH Performed at [facility], [place] [address] [place], [place] [address]    Report Status PENDING  Incomplete   Organism ID, Bacteria ENTEROBACTER AEROGENES (A)  Final      Susceptibility   Enterobacter aerogenes - MIC*    CEFAZOLIN RESISTANT Resistant     CEFTRIAXONE <=1 SENSITIVE Sensitive     CIPROFLOXACIN <=0.25 SENSITIVE Sensitive     GENTAMICIN <=1 SENSITIVE Sensitive     IMIPENEM <=0.25 SENSITIVE Sensitive     NITROFURANTOIN 128 RESISTANT Resistant     TRIMETH/SULFA <=20 SENSITIVE Sensitive     PIP/TAZO <=4 SENSITIVE Sensitive     * >=100,000 COLONIES/mL ENTEROBACTER AEROGENES  Blood culture (routine x 2)     Status: None (Preliminary result)   Collection Time: 10/21/18  6:43 AM   Specimen: BLOOD RIGHT FOREARM  Result Value Ref Range Status   Specimen Description BLOOD RIGHT FOREARM  Final   Special Requests   Final    BOTTLES DRAWN AEROBIC AND ANAEROBIC Blood Culture results may not be optimal due to an excessive volume of blood received in culture bottles   Culture   Final    NO GROWTH 2 DAYS Performed at [facility], [place] [address] [place], [place] [address]    Report  Status PENDING  Incomplete  Blood culture (routine x 2)     Status: None (Preliminary result)   Collection Time: 10/21/18  6:55 AM   Specimen: BLOOD RIGHT HAND  Result Value Ref Range Status   Specimen Description BLOOD RIGHT HAND  Final   Special Requests   Final    BOTTLES DRAWN AEROBIC AND ANAEROBIC Blood Culture adequate volume   Culture   Final    NO GROWTH 2 DAYS Performed at [facility], [place] [address] [place], [place] [address]    Report Status PENDING  Incomplete  SARS Coronavirus 2 (CEPHEID - Performed in [place] hospital lab), Hosp Order     Status: None   Collection Time: 10/21/18  8:53 AM   Specimen: Nasopharyngeal Swab  Result Value Ref Range Status   SARS Coronavirus 2 NEGATIVE NEGATIVE Final    Comment: (NOTE) If result is NEGATIVE SARS-CoV-2 target nucleic acids are NOT DETECTED. The SARS-CoV-2 RNA is generally detectable in upper and lower  respiratory specimens during the acute phase of infection. The lowest  concentration of SARS-CoV-2 viral copies this assay  can detect is 250  copies / mL. A negative result does not preclude SARS-CoV-2 infection  and should not be used as the sole basis for treatment or other  patient management decisions.  A negative result may occur with  improper specimen collection / handling, submission of specimen other  than nasopharyngeal swab, presence of viral mutation(s) within the  areas targeted by this assay, and inadequate number of viral copies  (<250 copies / mL). A negative result must be combined with clinical  observations, patient history, and epidemiological information. If result is POSITIVE SARS-CoV-2 target nucleic acids are DETECTED. The SARS-CoV-2 RNA is generally detectable in upper and lower  respiratory specimens dur ing the acute phase of infection.  Positive  results are indicative of active infection with SARS-CoV-2.  Clinical  correlation with patient history and other diagnostic information is   necessary to determine patient infection status.  Positive results do  not rule out bacterial infection or co-infection with other viruses. If result is PRESUMPTIVE POSTIVE SARS-CoV-2 nucleic acids MAY BE PRESENT.   A presumptive positive result was obtained on the submitted specimen  and confirmed on repeat testing.  While 2019 novel coronavirus  (SARS-CoV-2) nucleic acids may be present in the submitted sample  additional confirmatory testing may be necessary for epidemiological  and / or clinical management purposes  to differentiate between  SARS-CoV-2 and other Sarbecovirus currently known to infect humans.  If clinically indicated additional testing with an alternate test  methodology [phone]) is advised. The SARS-CoV-2 RNA is generally  detectable in upper and lower respiratory sp ecimens during the acute  phase of infection. The expected result is Negative. Fact Sheet for Patients:  StrictlyIdeas.no Fact Sheet for Healthcare Providers: BankingDealers.co.za This test is not yet approved or cleared by the [place] FDA and has been authorized for detection and/or diagnosis of SARS-CoV-2 by FDA under an Emergency Use Authorization (EUA).  This EUA will remain in effect (meaning this test can be used) for the duration of the COVID-19 declaration under Section 564(b)(1) of the Act, 21 U.S.C. section 360bbb-3(b)(1), unless the authorization is terminated or revoked sooner. Performed at [facility], [place] [address] [place], [place] [address]      Labs: BNP (last 3 results) No results for input(s): BNP in the last 8760 hours. Basic Metabolic Panel: Recent Labs  Lab 10/18/18 0855 10/20/18 1617 10/21/18 0651 10/22/18 0256 10/23/18 0748  NA 137 137 135 136 134*  K 3.9 3.8 4.1 4.9 4.3  CL 100 103 104 108 105  CO2 24 22 21* 17* 20*  GLUCOSE 97 87 76 78 90  BUN 14 29* 28* 23 12  CREATININE 1.17 1.35* 1.33* 1.16 0.88   CALCIUM 10.2 9.4 9.3 9.3 9.3  MG  --   --   --   --  1.7   Liver Function Tests: Recent Labs  Lab 10/18/18 0855 10/21/18 0651  AST 14* 18  ALT 9 13  ALKPHOS 45 46  BILITOT 0.5 0.8  PROT 9.5* 8.1  ALBUMIN 3.0* 2.3*   No results for input(s): LIPASE, AMYLASE in the last 168 hours. No results for input(s): AMMONIA in the last 168 hours. CBC: Recent Labs  Lab 10/18/18 0855 10/20/18 1617 10/21/18 0651 10/21/18 1300 10/22/18 0256 10/23/18 0748  WBC 4.9 19.0* 18.6*  --  25.2* 10.7*  NEUTROABS 1.9 17.3* 17.3*  --  22.6* 9.1*  HGB 11.6* 12.0* 9.9* 9.5* 11.5* 11.0*  HCT 33.9* 36.3* 29.0* 28.3* 34.4*  32.7*  MCV 98.3 103.4* 100.3*  --  101.8* 100.6*  PLT 210 203 190  --  176 163   Cardiac Enzymes: No results for input(s): CKTOTAL, CKMB, CKMBINDEX, TROPONINI in the last 168 hours. BNP: Invalid input(s): POCBNP CBG: No results for input(s): GLUCAP in the last 168 hours. D-Dimer No results for input(s): DDIMER in the last 72 hours. Hgb A1c No results for input(s): HGBA1C in the last 72 hours. Lipid Profile No results for input(s): CHOL, HDL, LDLCALC, TRIG, CHOLHDL, LDLDIRECT in the last 72 hours. Thyroid function studies No results for input(s): TSH, T4TOTAL, T3FREE, THYROIDAB in the last 72 hours.  Invalid input(s): FREET3 Anemia work up No results for input(s): VITAMINB12, FOLATE, FERRITIN, TIBC, IRON, RETICCTPCT in the last 72 hours. Urinalysis    Component Value Date/Time   COLORURINE AMBER (A) 10/20/2018 2032   APPEARANCEUR CLOUDY (A) 10/20/2018 2032   LABSPEC 1.020 10/20/2018 2032   PHURINE 5.0 10/20/2018 2032   GLUCOSEU NEGATIVE 10/20/2018 2032   HGBUR LARGE (A) 10/20/2018 2032   BILIRUBINUR NEGATIVE 10/20/2018 2032   KETONESUR NEGATIVE 10/20/2018 2032   PROTEINUR 100 (A) 10/20/2018 2032   UROBILINOGEN 2.0 (H) 07/21/2012 1145   NITRITE NEGATIVE 10/20/2018 2032   LEUKOCYTESUR LARGE (A) 10/20/2018 2032   Sepsis Labs Invalid input(s): PROCALCITONIN,  WBC,   LACTICIDVEN Microbiology Recent Results (from the past 240 hour(s))  Urine culture     Status: Abnormal (Preliminary result)   Collection Time: 10/20/18  8:32 PM   Specimen: Urine, Clean Catch  Result Value Ref Range Status   Specimen Description   Final    URINE, CLEAN CATCH Performed at [facility], [place] [address], [place], [place] [address]    Special Requests   Final    NONE Performed at [facility], [place] [address] [place], [place] [address]    Culture (A)  Final    >=100,000 COLONIES/mL ENTEROBACTER AEROGENES CULTURE REINCUBATED FOR BETTER GROWTH Performed at [facility], [place] [address] [place], [place] [address]    Report Status PENDING  Incomplete   Organism ID, Bacteria ENTEROBACTER AEROGENES (A)  Final      Susceptibility   Enterobacter aerogenes - MIC*    CEFAZOLIN RESISTANT Resistant     CEFTRIAXONE <=1 SENSITIVE Sensitive     CIPROFLOXACIN <=0.25 SENSITIVE Sensitive     GENTAMICIN <=1 SENSITIVE Sensitive     IMIPENEM <=0.25 SENSITIVE Sensitive     NITROFURANTOIN 128 RESISTANT Resistant     TRIMETH/SULFA <=20 SENSITIVE Sensitive     PIP/TAZO <=4 SENSITIVE Sensitive     * >=100,000 COLONIES/mL ENTEROBACTER AEROGENES  Blood culture (routine x 2)     Status: None (Preliminary result)   Collection Time: 10/21/18  6:43 AM   Specimen: BLOOD RIGHT FOREARM  Result Value Ref Range Status   Specimen Description BLOOD RIGHT FOREARM  Final   Special Requests   Final    BOTTLES DRAWN AEROBIC AND ANAEROBIC Blood Culture results may not be optimal due to an excessive volume of blood received in culture bottles   Culture   Final    NO GROWTH 2 DAYS Performed at [facility], [place] [address], [place], [place] [address]    Report Status PENDING  Incomplete  Blood culture (routine x 2)     Status: None (Preliminary result)   Collection Time: 10/21/18  6:55 AM   Specimen: BLOOD RIGHT HAND  Result Value Ref Range Status    Specimen Description  BLOOD RIGHT HAND  Final   Special Requests   Final    BOTTLES DRAWN AEROBIC AND ANAEROBIC Blood Culture adequate volume   Culture   Final    NO GROWTH 2 DAYS Performed at [facility], [address] [place], [place] [address]    Report Status PENDING  Incomplete  SARS Coronavirus 2 (CEPHEID - Performed in [place] hospital lab), Hosp Order     Status: None   Collection Time: 10/21/18  8:53 AM   Specimen: Nasopharyngeal Swab  Result Value Ref Range Status   SARS Coronavirus 2 NEGATIVE NEGATIVE Final    Comment: (NOTE) If result is NEGATIVE SARS-CoV-2 target nucleic acids are NOT DETECTED. The SARS-CoV-2 RNA is generally detectable in upper and lower  respiratory specimens during the acute phase of infection. The lowest  concentration of SARS-CoV-2 viral copies this assay can detect is 250  copies / mL. A negative result does not preclude SARS-CoV-2 infection  and should not be used as the sole basis for treatment or other  patient management decisions.  A negative result may occur with  improper specimen collection / handling, submission of specimen other  than nasopharyngeal swab, presence of viral mutation(s) within the  areas targeted by this assay, and inadequate number of viral copies  (<250 copies / mL). A negative result must be combined with clinical  observations, patient history, and epidemiological information. If result is POSITIVE SARS-CoV-2 target nucleic acids are DETECTED. The SARS-CoV-2 RNA is generally detectable in upper and lower  respiratory specimens dur ing the acute phase of infection.  Positive  results are indicative of active infection with SARS-CoV-2.  Clinical  correlation with patient history and other diagnostic information is  necessary to determine patient infection status.  Positive results do  not rule out bacterial infection or co-infection with other viruses. If result is PRESUMPTIVE POSTIVE SARS-CoV-2 nucleic  acids MAY BE PRESENT.   A presumptive positive result was obtained on the submitted specimen  and confirmed on repeat testing.  While 2019 novel coronavirus  (SARS-CoV-2) nucleic acids may be present in the submitted sample  additional confirmatory testing may be necessary for epidemiological  and / or clinical management purposes  to differentiate between  SARS-CoV-2 and other Sarbecovirus currently known to infect humans.  If clinically indicated additional testing with an alternate test  methodology [phone]) is advised. The SARS-CoV-2 RNA is generally  detectable in upper and lower respiratory sp ecimens during the acute  phase of infection. The expected result is Negative. Fact Sheet for Patients:  StrictlyIdeas.no Fact Sheet for Healthcare Providers: BankingDealers.co.za This test is not yet approved or cleared by the [place] FDA and has been authorized for detection and/or diagnosis of SARS-CoV-2 by FDA under an Emergency Use Authorization (EUA).  This EUA will remain in effect (meaning this test can be used) for the duration of the COVID-19 declaration under Section 564(b)(1) of the Act, 21 U.S.C. section 360bbb-3(b)(1), unless the authorization is terminated or revoked sooner. Performed at [facility], [place] [address], [place], [place] [address]      Time coordinating discharge: 35 minutes  SIGNED:   Aline August, MD  [facility] 10/23/2018, 11:21 AM

## 2018-10-23 NOTE — TOC Transition Note (Signed)
Transition of Care [facility]) - CM/SW Discharge Note   Patient Details  Name: Martin Conner MRN: [id number] Date of Birth: 10-24-1927  [facility] CM/SW Contact:  Carles Collet, RN Phone Number: 10/23/2018, 12:05 PM   Clinical Narrative:   Damaris Schooner w patient's wife. He is active with [facility]. Notified [facility] of patient's discharge. She states he has all needed DME at home. No other CM needs identified.     Final next level of care: [place] Barriers to Discharge: No Barriers Identified   Patient Goals and CMS Choice Patient states their goals for this hospitalization and ongoing recovery are:: to return home CMS Medicare.gov Compare Post Acute Care list provided to:: Patient Choice offered to / list presented to : Spouse  Discharge Placement                       Discharge Plan and Services                          HH Arranged: PT, RN [facility] Agency: [facility]        Social Determinants of Health (SDOH) Interventions     Readmission Risk Interventions No flowsheet data found.

## 2018-10-23 NOTE — Evaluation (Signed)
Physical Therapy Evaluation Patient Details Name: Martin Conner MRN: [id number] DOB: 04-19-1927 Today's Date: 10/23/2018   History of Present Illness  Pt is an 85 y/o male admitted secondary to UTI and sepsis. PMH including but not limited to multiple myeloma, permanent atrial fibrillation, hypertension, hyperlipidemia, dementia, and chronic kidney disease stage III.    Clinical Impression  Pt presented supine in bed with HOB elevated, awake and willing to participate in therapy session. Prior to admission, pt ambulated with use of a rollator or occasionally a cane. Pt's wife (via telephone) reported that she assists him with bathing, dressing, bed mobility and transfers. He receives HHPT 2x/week. At the time of evaluation, pt required min A for bed mobility, mod A for sit<>stand from EOB and min guard to ambulate with use of RW. Pt would continue to benefit from skilled physical therapy services at this time while admitted and after d/c to address the below listed limitations in order to improve overall safety and independence with functional mobility.     Follow Up Recommendations Home health PT;Supervision/Assistance - 24 hour    Equipment Recommendations  None recommended by PT;Other (comment)(has DME at home)    Recommendations for Other Services       Precautions / Restrictions Precautions Precautions: Fall Precaution Comments: foley Restrictions Weight Bearing Restrictions: No      Mobility  Bed Mobility Overal bed mobility: Needs Assistance Bed Mobility: Supine to Sit     Supine to sit: Min assist     General bed mobility comments: increased time and effort, min A to elevate trunk  Transfers Overall transfer level: Needs assistance Equipment used: Rolling walker (2 wheeled) Transfers: Sit to/from Stand Sit to Stand: Mod assist         General transfer comment: increased time and effort, cueing for safe hand placement, use of momentum, mod A to power into standing  from EOB  Ambulation/Gait Ambulation/Gait assistance: Min guard Gait Distance (Feet): 40 Feet Assistive device: Rolling walker (2 wheeled) Gait Pattern/deviations: Step-through pattern;Decreased stride length;Narrow base of support Gait velocity: decreased   General Gait Details: pt with mild instability but no overt LOB or need for physical assistance, min guard for safety  Stairs            Wheelchair Mobility    Modified Rankin (Stroke Patients Only)       Balance Overall balance assessment: Needs assistance Sitting-balance support: Feet supported Sitting balance-Leahy Scale: Good     Standing balance support: Bilateral upper extremity supported Standing balance-Leahy Scale: Poor                               Pertinent Vitals/Pain Pain Assessment: No/denies pain    Home Living Family/patient expects to be discharged to:: Private residence Living Arrangements: Spouse/significant other Available Help at Discharge: Family Type of Home: House Home Access: Stairs to enter Entrance Stairs-Rails: None Entrance Stairs-Number of Steps: 2 Home Layout: One level Home Equipment: Environmental consultant - 2 wheels;Cane - single point      Prior Function Level of Independence: Needs assistance   Gait / Transfers Assistance Needed: ambulates with a rollator or a cane  ADL's / Homemaking Assistance Needed: patient's wife assists with bathing and dressing  Comments: pt gets HHPT 2x/week     Hand Dominance        Extremity/Trunk Assessment   Upper Extremity Assessment Upper Extremity Assessment: Generalized weakness    Lower Extremity Assessment Lower  Extremity Assessment: Generalized weakness    Cervical / Trunk Assessment Cervical / Trunk Assessment: Kyphotic  Communication   Communication: HOH  Cognition Arousal/Alertness: Awake/alert Behavior During Therapy: WFL for tasks assessed/performed Overall Cognitive Status: History of cognitive impairments - at  baseline                                        General Comments      Exercises     Assessment/Plan    PT Assessment Patient needs continued PT services  PT Problem List Decreased strength;Decreased activity tolerance;Decreased balance;Decreased mobility;Decreased coordination;Decreased knowledge of use of DME;Decreased cognition;Decreased safety awareness;Decreased knowledge of precautions       PT Treatment Interventions DME instruction;Gait training;Stair training;Functional mobility training;Therapeutic activities;Therapeutic exercise;Balance training;Neuromuscular re-education;Cognitive remediation;Patient/family education    PT Goals (Current goals can be found in the Care Plan section)  Acute Rehab PT Goals Patient Stated Goal: "go home" PT Goal Formulation: With patient Time For Goal Achievement: 11/06/18 Potential to Achieve Goals: Good    Frequency Min 3X/week   Barriers to discharge        Co-evaluation               AM-PAC PT "6 Clicks" Mobility  Outcome Measure Help needed turning from your back to your side while in a flat bed without using bedrails?: A Little Help needed moving from lying on your back to sitting on the side of a flat bed without using bedrails?: A Little Help needed moving to and from a bed to a chair (including a wheelchair)?: A Little Help needed standing up from a chair using your arms (e.g., wheelchair or bedside chair)?: A Lot Help needed to walk in hospital room?: A Little Help needed climbing 3-5 steps with a railing? : A Lot 6 Click Score: 16    End of Session Equipment Utilized During Treatment: Gait belt Activity Tolerance: Patient tolerated treatment well Patient left: in bed;with call bell/phone within reach;with bed alarm set;Other (comment)(sitting EOB) Nurse Communication: Mobility status PT Visit Diagnosis: Other abnormalities of gait and mobility (R26.89)    Time: 1342-1410 PT Time Calculation  (min) (ACUTE ONLY): 28 min   Charges:   PT Evaluation $PT Eval Moderate Complexity: 1 Mod PT Treatments $Gait Training: 8-22 mins        Sherie Don, [place], DPT  Acute Rehabilitation Services Pager [phone] Office [place] 10/23/2018, 2:53 PM

## 2018-10-25 ENCOUNTER — Telehealth: Payer: Self-pay | Admitting: *Deleted

## 2018-10-25 ENCOUNTER — Telehealth: Payer: Self-pay | Admitting: Internal Medicine

## 2018-10-25 ENCOUNTER — Telehealth: Payer: Self-pay

## 2018-10-25 DIAGNOSIS — N179 Acute kidney failure, unspecified: Secondary | ICD-10-CM

## 2018-10-25 LAB — URINE CULTURE: Culture: >=100000 — AB

## 2018-10-25 MED ORDER — HYDROCODONE-ACETAMINOPHEN 5-325 MG PO TABS: 1.0000 | ORAL_TABLET | Freq: Four times a day (QID) | ORAL | 0 refills | Status: DC | PRN

## 2018-10-25 NOTE — Telephone Encounter (Signed)
[place] [phone]  Did the resumption of care after he was in the hospital over the weekend with a UTI. He receiving nursing and PT services.

## 2018-10-25 NOTE — Telephone Encounter (Signed)
okay

## 2018-10-25 NOTE — Telephone Encounter (Signed)
Oral Oncology Patient Advocate Encounter  Over the weekend, Martin Conner from [place] called Martin Conner and stated that the patient was being discharged and needed his Martin Conner filled. The pharmacy filled the Martin Conner and called the patient to let him know it was ready to pick up.  Martin Conner, the patients daughter told her parents not to pick it up because I talked to her on Friday 7/31 about getting a grant to cover the copay and told her then that Martin Conner, oral oncology pharmacist would be keeping track of the patients status and would call and counsel them before the medicine was filled.  I informed the pharmacy of this and they have returned it to stock. I also spoke to [facility] and reiterated that the medicine would not be filled until Martin Conner, oral oncology pharmacist spoke with them about the [place].  Martin Conner verbalized understanding and great appreciation.  Martin Conner Patient [name] Phone [phone] Fax [phone] 10/25/2018   10:30 AM

## 2018-10-25 NOTE — Telephone Encounter (Signed)
Contacted by daughter Cordell Guercio. She states her father is home from the hospital - she asked when his Home Health PT was to resume, stating PT using comes on Tuesday/Thursday. Advised to contact PT and inform him patient is home and ready to resume Home PT. She stated that she had heard from [place] (Lilyan Punt, CPht) and Evelena Peat [place], [facility]) regarding her father starting Ninlaro. They are working on funding and education prior to patient starting med. Patient next appt is 8/25 - verified with [name].

## 2018-10-25 NOTE — Telephone Encounter (Signed)
Please review concern below.

## 2018-10-25 NOTE — Telephone Encounter (Signed)
Patient's wife called stating that he was just released from the hospital Saturday. Mrs. Brase stated that he is being treated for a UTI. Mrs. Bultman stated that her husband is not sleeping and s he tells her he is hurting all over. Mrs. Shreeve wants to know if Dr. Theotis Barrio can call something in to help him sleep or something for pain so he can relax? Pharmacy [facility]

## 2018-10-25 NOTE — Telephone Encounter (Signed)
Also Dr. Silvio Pate when can patient get in for Hospital f/u? Is tomorrow morning too soon?

## 2018-10-25 NOTE — Telephone Encounter (Signed)
I will call wife to decide on appropriate action

## 2018-10-25 NOTE — Telephone Encounter (Signed)
Discussed with wife He is sleeping now--but still with intermittent pain Will send Rx for hydrocodone for prn use PDMP reviewed --nothing  Not really able to get him out---so will just plan home visit (wouldn't be till next month but can push up if he is not doing well)

## 2018-10-25 NOTE — Telephone Encounter (Signed)
Transition Care Management Follow-up Telephone Call Spoke with patient's wife and daughter-Jan.   Date discharged? 10/23/2018   How have you been since you were released from the hospital?  Patient is not sleeping well, he is miserable. He is hurting between his legs and in his groin area. He is moaning a lot. He is not oriented to time, person or place-this was an issue when he went to the hospital. Patient thinks the year is 2000 or 2001 and the president is Zena Amos. He is drinking a few a sips of water but not a lot at all. Patient took Advil 600 mg at 6:30 am this morning and it did not help at all. Patient has appointment with Dr. Gloriann Loan on 11/09/2018. Patient has catheter in. Cipro antibiotic was not filled until today because pharmacy was closed when patient got released. Patient does recognize his wife and daughter. They are requesting pain medication- see other message also.   Do you understand why you were in the hospital? yes   Do you understand the discharge instructions? yes   Where were you discharged to? Home with wife and his daughter.   Items Reviewed:  Medications reviewed: yes  Allergies reviewed: yes  Dietary changes reviewed: yes  Referrals reviewed: Urologist and Home PT   Functional Questionnaire:   Activities of Daily Living (ADLs):   He states they are independent in the following: none States they require assistance with the following: ambulation, fixing food, dressing, hygiene, bathing, toileting.   Any transportation issues/concerns?: Yes. Had to call rescue last time when patient went to ER. The time before Dr. Silvio Pate came to the house to see patient.   Any patient concerns? See above per daughter and wife   Confirmed importance and date/time of follow-up visits scheduled -not at this time. Message sent to PCP about appointment  Confirmed with patient if condition begins to worsen call PCP or go to the ER.  Patient was given the office number and  encouraged to call back with question or concerns.  : yes

## 2018-10-25 NOTE — Telephone Encounter (Signed)
Spoke to [employer].

## 2018-10-26 ENCOUNTER — Telehealth: Payer: Self-pay

## 2018-10-26 LAB — CULTURE, BLOOD (ROUTINE X 2)
Culture: NO GROWTH
Culture: NO GROWTH
Special Requests: ADEQUATE

## 2018-10-26 NOTE — Telephone Encounter (Signed)
Post ED Visit - Positive Culture Follow-up  Culture report reviewed by antimicrobial stewardship pharmacist: [place] Team []  Elenor Quinones, Pharm.D. []  Heide Guile, Pharm.D., BCPS AQ-ID []  Parks Neptune, Pharm.D., BCPS []  Alycia Rossetti, Pharm.D., BCPS []  [place], [place].D., BCPS, AAHIVP []  Legrand Como, Pharm.D., BCPS, AAHIVP []  Salome Arnt, PharmD, BCPS []  Johnnette Gourd, PharmD, BCPS []  Hughes Better, PharmD, BCPS []  Leeroy Cha, PharmD []  Laqueta [place], PharmD, BCPS []  Albertina Parr, PharmD  [place] Team []  Leodis Sias, PharmD []  [place]ell Spar, PharmD []  Royetta Asal, PharmD []  Graylin Shiver, Rph []  Rema Fendt) Glennon Mac, PharmD []  Arlyn Dunning, PharmD []  Netta Cedars, PharmD []  Dia Sitter, PharmD []  Leone Haven, PharmD []  Gretta Arab, PharmD []  Theodis Shove, PharmD []  Peggyann Juba, PharmD [x]  Reuel Boom, PharmD   Positive urine culture Treated with Cephalexin, organism sensitive to the same and no further patient follow-up is required at this time.  Genia Del 10/26/2018, 10:36 AM

## 2018-10-27 MED ORDER — DEXAMETHASONE 4 MG PO TABS: 20.0000 mg | ORAL_TABLET | ORAL | 3 refills | Status: DC

## 2018-10-27 NOTE — Telephone Encounter (Signed)
Oral Chemotherapy Pharmacist Encounter   I spoke with patient's wife and daughter for overview of: Ninlaro (ixazomib) for the treatment of multiple myeloma not having achieved remission in conjunction with dexamethasone, planned duration until adequate disease control or unacceptable toxicity. Patient has been on treatment with bortezomib and dexamethasone. Treatment change due to administration ease and ability to avoid office visits.  Counseled on administration, dosing, side effects, monitoring, drug-food interactions, safe handling, storage, and disposal.  Patient will take Ninlaro 59m capsules, 1 capsule by mouth once weekly. Take on an empty stomach, 1 hour before or 2 hours after a meal. Take on days 1, 8, and 15 of each 28 day cycle.  Patient will take dexamethasone 439mtablets, 5 tabs (2065mby mouth once weekly on days 1, 8, 15, and 22 of each 28 day cycle.  Ninlaro start date: 11/01/18 Patient is recovering from admission for UTI. Antibiotic course is planned to complete on 8/7. Family states patient is almost completely over acute issues. They will start Ninlaro on 8/10 as long as patient is completely recovered. They will call the office for guidance on start date if they do not have a good weekend.  Adverse effects of Ninlaro include but are not limited to: peripheral edema, constipation, nausea, vomiting, decreased blood counts, and peripheral neuropathy.    Patient has anti-emetic on hand and knows to take it if nausea develops.   Patient will take ondansetron 8mg24mblet 30-60 min prior to Ninlaro administration.  Reviewed importance of keeping a medication schedule and plan for any missed doses.  Medication reconciliation performed and medication/allergy list updated.  They will pick up acyclovir prescription and will start it. Importance of this supportive care medication reviewed.  Medication calendar and [facility] will be mailed to  patient.  Insurance authorization for NinlKennieth Rad been obtained.  Test claim at the pharmacy revealed copayment $1800 for 1st fill of Ninlaro. This is not affordable to patient. Oral oncology patient advocate was successful in securing foundation copayment grant to cover out of pocket expenses for [facility]. Copayment is now $0  [place]o [place] for delivery to patient's mailing address on 8/7.  All questions answered.  [place]ala Hurry [place] understanding and appreciation.   They know to call the office with questions or concerns.  JessJohny DrillingarmD, BCPS, BCOP  10/27/2018 10:44 AM Oral Oncology Clinic [phone]

## 2018-10-28 MED FILL — DEXAMETHASONE 4 MG TABLET: 4 | 28 days supply | Qty: 20 | Fill #0

## 2018-10-28 MED FILL — NINLARO 3 MG CAPSULE: 3 | 28 days supply | Qty: 3 | Fill #0

## 2018-10-29 NOTE — Telephone Encounter (Signed)
Oral Oncology Patient Advocate Encounter  Confirmed with [place] that [facility] and Dex was shipped on 8/6 with a $0 copay using his [place].   [place] Patient [place] Phone [phone] Fax [phone] 10/29/2018   9:08 AM

## 2018-11-02 ENCOUNTER — Telehealth: Payer: Self-pay

## 2018-11-02 MED ORDER — TAMSULOSIN HCL 0.4 MG PO CAPS: 0.4000 mg | ORAL_CAPSULE | Freq: Every day | ORAL | 11 refills | Status: AC

## 2018-11-02 NOTE — Telephone Encounter (Signed)
1. Wife said he needed a pill for Shingles. I advised her to call the oncologist who orders his chemo to get that.  2. Pt needs a refill of tamsulosin sent to [facility]. I have done that.  3. I cancelled his in office [facility] that was scheduled for 11-15-18.

## 2018-11-15 ENCOUNTER — Encounter: Payer: Medicare Other | Admitting: Internal Medicine

## 2018-11-16 ENCOUNTER — Inpatient Hospital Stay (HOSPITAL_BASED_OUTPATIENT_CLINIC_OR_DEPARTMENT_OTHER): Payer: Medicare Other | Admitting: Hematology

## 2018-11-16 ENCOUNTER — Inpatient Hospital Stay: Payer: Medicare Other | Attending: Hematology and Oncology

## 2018-11-16 ENCOUNTER — Other Ambulatory Visit: Payer: Self-pay

## 2018-11-16 ENCOUNTER — Telehealth: Payer: Self-pay | Admitting: Hematology

## 2018-11-16 VITALS — BP 122/69 | HR 89 | Temp 98.5°F | Resp 18 | Ht 73.0 in | Wt 163.1 lb

## 2018-11-16 DIAGNOSIS — C9000 Multiple myeloma not having achieved remission: Secondary | ICD-10-CM | POA: Diagnosis not present

## 2018-11-16 DIAGNOSIS — E785 Hyperlipidemia, unspecified: Secondary | ICD-10-CM | POA: Diagnosis not present

## 2018-11-16 DIAGNOSIS — Z79899 Other long term (current) drug therapy: Secondary | ICD-10-CM | POA: Diagnosis not present

## 2018-11-16 DIAGNOSIS — I4821 Permanent atrial fibrillation: Secondary | ICD-10-CM | POA: Diagnosis not present

## 2018-11-16 LAB — CMP (CANCER CENTER ONLY)
ALT: 19 U/L (ref 0–44)
AST: 16 U/L (ref 15–41)
Albumin: 2.9 g/dL — ABNORMAL LOW (ref 3.5–5.0)
Alkaline Phosphatase: 52 U/L (ref 38–126)
Anion gap: 14 (ref 5–15)
BUN: 28 mg/dL — ABNORMAL HIGH (ref 8–23)
CO2: 23 mmol/L (ref 22–32)
Calcium: 9.8 mg/dL (ref 8.9–10.3)
Chloride: 99 mmol/L (ref 98–111)
Creatinine: 1.23 mg/dL (ref 0.61–1.24)
GFR, Est AFR Am: 60 mL/min — ABNORMAL LOW (ref >60)
GFR, Estimated: 52 mL/min — ABNORMAL LOW (ref >60)
Glucose, Bld: 111 mg/dL — ABNORMAL HIGH (ref 70–99)
Potassium: 4.4 mmol/L (ref 3.5–5.1)
Sodium: 136 mmol/L (ref 135–145)
Total Bilirubin: 0.6 mg/dL (ref 0.3–1.2)
Total Protein: 9.5 g/dL — ABNORMAL HIGH (ref 6.5–8.1)

## 2018-11-16 LAB — CBC WITH DIFFERENTIAL/PLATELET
Abs Immature Granulocytes: 0.05 10*3/uL (ref 0.00–0.07)
Basophils Absolute: 0 10*3/uL (ref 0.0–0.1)
Basophils Relative: 0 %
Eosinophils Absolute: 0 10*3/uL (ref 0.0–0.5)
Eosinophils Relative: 0 %
HCT: 34.4 % — ABNORMAL LOW (ref 39.0–52.0)
Hemoglobin: 11.5 g/dL — ABNORMAL LOW (ref 13.0–17.0)
Immature Granulocytes: 0 %
Lymphocytes Relative: 8 %
Lymphs Abs: 1 10*3/uL (ref 0.7–4.0)
MCH: 33.6 pg (ref 26.0–34.0)
MCHC: 33.4 g/dL (ref 30.0–36.0)
MCV: 100.6 fL — ABNORMAL HIGH (ref 80.0–100.0)
Monocytes Absolute: 0.3 10*3/uL (ref 0.1–1.0)
Monocytes Relative: 2 %
Neutro Abs: 11.7 10*3/uL — ABNORMAL HIGH (ref 1.7–7.7)
Neutrophils Relative %: 90 %
Platelets: 119 10*3/uL — ABNORMAL LOW (ref 150–400)
RBC: 3.42 MIL/uL — ABNORMAL LOW (ref 4.22–5.81)
RDW: 16 % — ABNORMAL HIGH (ref 11.5–15.5)
WBC: 13 10*3/uL — ABNORMAL HIGH (ref 4.0–10.5)
nRBC: 0 % (ref 0.0–0.2)

## 2018-11-16 NOTE — Progress Notes (Signed)
HEMATOLOGY/ONCOLOGY CLINIC NOTE  Date of Service: 11/16/2018  Patient Care Team: Venia Carbon, MD as PCP - Bethena Roys, MD as Consulting Physician (Cardiology) Marchia Bond, MD as Consulting Physician (Orthopedic Surgery)  CHIEF COMPLAINTS/PURPOSE OF CONSULTATION:  Multiple Myeloma  HISTORY OF PRESENTING ILLNESS:   Martin Conner is a wonderful 85 y.o. male who has been referred to [place] by Dr. Nicholas Lose for evaluation and management of his newly diagnosed Multiple Myeloma. The pt reports that he is doing well overall.   The pt reports that he has not felt differently recently as compared to his baseline 6-12 months ago. The pt notes that he had a physical in late January 2020 with his PCP and routine labs which revealed a concern for high protein. This was evaluated further and the pt was referred to my colleague Dr. Lindi Adie who has completed initial work up.  The pt denies problems passing urine, bowel abnormalities, or neuropathy. He also denies back pains.  The pt notes that he walks very little, ambulates with a cane or a walker, but can get around in his home well. He lives with his wife in his own home. The pt denies having falls, and uses a cane/walker to prevent falls. The pt notes that he has two children, and notes that they are not very involved with his medical decision making.  The pt denies heart, liver, lung, thyroid problems. He does have Afib however, and takes Eliquis.  Most recent lab results (06/03/18) of CBC w/diff is as follows: all values are WNL except for WBC at 3.8k, RBC at 3.73, HGB at 12.5, HCT at 37.4, MCV at 100.3, PLT at 145k. 06/03/18 MMP revealed all values WNL except for IgA at 2646, Total Protein at 8.7, B-globulin at 3.6, M Protein at 2.6g, and Globulin Total at 5.2. 06/03/18 Beta-2 microglobulin at 3.1 06/03/18 SFLC revealed Kappa at 1390.2, and K:L ratio at 105.32  On review of systems, pt reports stable energy levels, and denies  urination abnormalities, back pain, changes in bowel habits, abdominal pains, and any other symptoms.   On PMHx the pt reports Afib. On Social Hx the pt reports infrequent single alcoholic beverage. Denies ever smoking cigarettes. 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 On Family Hx the pt denies significant concerns.  Interval History:    Martin Conner returns today for management and evaluation of his Multiple Myeloma. The patient's last visit with [place] was on 10/18/2018. The pt reports that he is doing well overall. We are joined by his wife via phone.   The pt reports that he has been taking his Ninlaro as prescribed. He states that he hasn't been feeling the best because since he began taking it but he can't describe his discomfort. His wife states that his fatigue is worsening, he sleeps a lot, he has poor appetite and has lost weight. He hasn't had any issues urinating without his catheter. Pt is having normal looking bowel movements which happen about every other day. Pt has been attempting to move around more and being more active. He has been seeing a PT twice a week, which has been helping him. Pt would like to do another cycle of Ninlaro to see if he improves.  Lab results today (11/16/18) of CBC w/diff and CMP is as follows: all values are WNL except for WBC at 13.0K, RBC at 3.42, Hgb at 11.5, HCT at 34.4, MCV at 100.6, RDW at 16.0, Platelets  at 119K, Neutro Abs at 11.7K, Glucose at 111, BUN at 28, Total Protein at 9.5, Albumin at 2.9, GFR Est Non Af Am at 52. 11/16/2018 MMP in progress   On review of systems, pt reports fatigue, weight loss, loss of appetite and denies diarrhea, nausea, abdominal pain, leg swelling and any other symptoms.   MEDICAL HISTORY:  Past Medical History:  Diagnosis Date  . Fracture of femoral neck, right ([place]) 07/10/2015   s/p mechanical fall  . Hyperlipidemia   . Hypertensive heart disease   . Lumbar disc disease   . Osteoarthritis   .  Permanent atrial fibrillation    a. 07/2012 Echo: EF 55-60%, no rwma, mildly dil RA/LA.  Marland Kitchen Syncope    a. 07/2012 - unclear etiology-->resulted in right tib/fib fx req ORIF/intramedullary nail of tibia.    SURGICAL HISTORY: Past Surgical History:  Procedure Laterality Date  . ANKLE SURGERY     right ankle  . TIBIA IM NAIL INSERTION Right 07/21/2012   Procedure: INTRAMEDULLARY (IM) NAIL TIBIAL;  Surgeon: Johnny Bridge, MD;  Location: [facility];  Service: Orthopedics;  Laterality: Right;  . TOTAL HIP ARTHROPLASTY     left  . TOTAL HIP ARTHROPLASTY Right 07/11/2015   Procedure: TOTAL HIP ARTHROPLASTY;  Surgeon: Marchia Bond, MD;  Location: [facility];  Service: Orthopedics;  Laterality: Right;    SOCIAL HISTORY: Social History   Socioeconomic History  . Marital status: Married    Spouse name: Not on file  . Number of children: 2  . Years of education: Not on file  . Highest education level: Not on file  Occupational History  . Occupation: retired     Fish farm manager: [employer]  . Financial resource strain: Not on file  . Food insecurity    Worry: Not on file    Inability: Not on file  . Transportation needs    Medical: Not on file    Non-medical: Not on file  Tobacco Use  . Smoking status: Never Smoker  . Smokeless tobacco: Never Used  Substance and Sexual Activity  . Alcohol use: Yes    Alcohol/week: 12.0 standard drinks    Types: 12 Cans of beer per week    Comment: 2 beers several x/wk and occas cocktail on top of it.  . Drug use: No  . Sexual activity: Never  Lifestyle  . Physical activity    Days per week: Not on file    Minutes per session: Not on file  . Stress: Not on file  Relationships  . Social Herbalist on phone: Not on file    Gets together: Not on file    Attends religious service: Not on file    Active member of club or organization: Not on file    Attends meetings of clubs or organizations: Not on file    Relationship status: Not on file   . Intimate partner violence    Fear of current or ex partner: Not on file    Emotionally abused: Not on file    Physically abused: Not on file    Forced sexual activity: Not on file  Other Topics Concern  . Not on file  Social History Narrative   Not sure about living will   Requests that wife make decisions for him if needed   Would accept resuscitation but no prolonged artificial ventilation   Would not want tube feeds if cognitively unaware    FAMILY HISTORY: Family  History  Problem Relation Age of Onset  . Cancer Mother   . Hyperlipidemia Sister   . Hypertension Sister     ALLERGIES:  has No Known Allergies.  MEDICATIONS:  Current Outpatient Medications  Medication Sig Dispense Refill  . Acetaminophen (TYLENOL PO) Take 500 mg by mouth daily as needed (pain). Pt unsure of strength     . dexamethasone (DECADRON) 4 MG tablet Take 5 tablets (20 mg total) by mouth once a week. Take with breakfast. 20 tablet 3  . HYDROcodone-acetaminophen (NORCO/VICODIN) 5-325 MG tablet Take 1-2 tablets by mouth every 6 (six) hours as needed for moderate pain. 30 tablet 0  . ibuprofen (ADVIL) 200 MG tablet Take 400 mg by mouth every 6 (six) hours as needed for moderate pain.    Marland Kitchen ixazomib citrate (NINLARO) 3 MG capsule Take 1 capsule (3 mg) by mouth weekly, 3 weeks on, 1 week off, repeat every 4 weeks. Take on an empty stomach 1hr before or 2hr after meals. 3 capsule 1  . ondansetron (ZOFRAN) 8 MG tablet Take 1 tablet (8 mg total) by mouth 2 (two) times daily as needed (Nausea or vomiting). 30 tablet 1  . polyethylene glycol (MIRALAX / GLYCOLAX) 17 g packet Take 17 g by mouth daily.    . sennosides-docusate sodium (SENOKOT-S) 8.6-50 MG tablet Take 2 tablets by mouth as needed for constipation.    . tamsulosin (FLOMAX) 0.4 MG CAPS capsule Take 1 capsule (0.4 mg total) by mouth daily after supper. 30 capsule 11   No current facility-administered medications for this visit.     REVIEW OF SYSTEMS:     A 10+ POINT REVIEW OF SYSTEMS WAS OBTAINED including neurology, dermatology, psychiatry, cardiac, respiratory, lymph, extremities, GI, GU, Musculoskeletal, constitutional, breasts, reproductive, HEENT.  All pertinent positives are noted in the HPI.  All others are negative.   PHYSICAL EXAMINATION: ECOG PERFORMANCE STATUS: 3 - Symptomatic, >50% confined to bed  Vitals:   11/16/18 1437  BP: 122/69  Pulse: 89  Resp: 18  Temp: 98.5 F (36.9 C)  SpO2: 99%   Filed Weights   11/16/18 1437  Weight: 163 lb 1.6 oz (74 kg)   .Body mass index is 21.52 kg/m.    GENERAL:alert, in no acute distress and comfortable SKIN: no acute rashes, no significant lesions EYES: conjunctiva are pink and non-injected, sclera anicteric OROPHARYNX: MMM, no exudates, no oropharyngeal erythema or ulceration NECK: supple, no JVD LYMPH:  no palpable lymphadenopathy in the cervical, axillary or inguinal regions LUNGS: clear to auscultation b/l with normal respiratory effort HEART: regular rate & rhythm ABDOMEN:  normoactive bowel sounds , non tender, not distended. No palpable hepatosplenomegaly. Extremity: no pedal edema PSYCH: alert & oriented x 3 with fluent speech NEURO: no focal motor/sensory deficits  LABORATORY DATA:  I have reviewed the data as listed  . CBC Latest Ref Rng & Units 11/16/2018 10/23/2018 10/22/2018  WBC 4.0 - 10.5 K/uL 13.0(H) 10.7(H) 25.2(H)  Hemoglobin 13.0 - 17.0 g/dL 11.5(L) 11.0(L) 11.5(L)  Hematocrit 39.0 - 52.0 % 34.4(L) 32.7(L) 34.4(L)  Platelets 150 - 400 K/uL 119(L) 163 176  ANC1.5k  . CMP Latest Ref Rng & Units 11/16/2018 10/23/2018 10/22/2018  Glucose 70 - 99 mg/dL 111(H) 90 78  BUN 8 - 23 mg/dL 28(H) 12 23  Creatinine 0.61 - 1.24 mg/dL 1.23 0.88 1.16  Sodium 135 - 145 mmol/L 136 134(L) 136  Potassium 3.5 - 5.1 mmol/L 4.4 4.3 4.9  Chloride 98 - 111 mmol/L 99 105 108  CO2 22 - 32 mmol/L 23 20(L) 17(L)  Calcium 8.9 - 10.3 mg/dL 9.8 9.3 9.3  Total Protein 6.5 - 8.1  g/dL 9.5(H) - -  Total Bilirubin 0.3 - 1.2 mg/dL 0.6 - -  Alkaline Phos 38 - 126 U/L 52 - -  AST 15 - 41 U/L 16 - -  ALT 0 - 44 U/L 19 - -    06/03/18 BM Bx:    06/03/18 Cytogenetics:      RADIOGRAPHIC STUDIES: I have personally reviewed the radiological images as listed and agreed with the findings in the report. Ct Renal Stone Study  Result Date: 10/21/2018 CLINICAL DATA:  Left abdominal and pelvis pain for 3 days EXAM: CT ABDOMEN AND PELVIS WITHOUT CONTRAST TECHNIQUE: Multidetector CT imaging of the abdomen and pelvis was performed following the standard protocol without IV contrast. COMPARISON:  PET-CT 07/20/2018 FINDINGS: Lower chest:  Coronary atherosclerosis. Hepatobiliary: No focal liver abnormality.Layering high-density in the gallbladder fundus, sludge versus calculi. No evidence of pericholecystic inflammation. Pancreas: Unremarkable. Spleen: Unremarkable. Adrenals/Urinary Tract: Negative adrenals. No hydronephrosis or ureteral stone. Bilateral renal cystic densities. 15 mm stone in the lower pole right kidney. Bladder is largely obscured by streak artifact from the hips. A Foley catheter drains the bladder. Stomach/Bowel: No obstruction. No appendicitis. Multiple colonic diverticula. Vascular/Lymphatic: No acute vascular abnormality. Extensive atherosclerotic calcification. No mass or adenopathy. Reproductive:Negative Other: No ascites or pneumoperitoneum. Musculoskeletal: Spondylosis.  Bilateral hip arthroplasty. IMPRESSION: 1. No acute finding. 2. Bilateral nephrolithiasis. 3. Extensive colonic diverticulosis. 4. Gallbladder sludge versus calculi without evidence of cholecystitis. Electronically Signed   By: Monte Fantasia M.D.   On: 10/21/2018 10:11    ASSESSMENT & PLAN:   85 y.o. male with  1. Multiple Myeloma 06/03/18 BM Biopsy revealed cellular marrow involved by plasma cell neoplasm (about 50%) 06/03/18 Cytogenetics revealed 54.5% of cells showed gain of ATM, and 51.5%  of cells with loss of p53 06/03/18 Bone Survey revealed Possible 8 mm lytic lesion versus subarachnoid granulation within the parietal convexity of the calvarium. No other suspicious lytic lesions seen within the skeleton. Labs upon initial presentation from 06/03/18, M Protein at 2.6g with IgA kappa specificity. Some new mild anemia with HGB at 12.5.  06/30/18 M Protein at 3.1g  07/20/18 PET/CT revealed "No hypermetabolic bone disease evident. 2. 12 mm hypermetabolic right thyroid nodule, nonspecific. Thyroid ultrasound could be used to further evaluate as clinically warranted in this individual. No other unexpected hypermetabolic soft tissue disease identified on today's study. 3. Bilateral tiny pulmonary nodules measuring up to about 3 mm maximum size. Consider follow-up to ensure stability. 4.  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 -Discussed 11/16/2018 MMP in progress  -Discussed pt keeping up with his food intake, fluid intake and activity.  -Discussed that the pt has a 17p mutation, which can be a predictor of treatment resistance and a possibly more aggressive course. -Continue Acyclovir BID -prescribed Ninlaro 72m weekly on D1,8,15 every 28 days with Dexamethasone 251mweekly.  -Will put pt on another cycle of Ninlaro -  Will see back in 1 month with labs   FOLLOW UP: RTC with Dr Irene Limbo with labs in 4 weeks   The total time spent in the appt was 20 minutes and more than 50% was on counseling and direct patient cares.  All of the patient's questions were answered  with apparent satisfaction. The patient knows to call the clinic with any problems, questions or concerns.    Sullivan Lone MD [place] AAHIVMS [facility] Hematology/Oncology Physician [facility]  (Office):       [phone] (Work cell):  [phone] (Fax):           [phone]  11/16/2018 4:30 PM  I, Yevette Edwards, am acting as a scribe for Dr. Sullivan Lone.   .I have reviewed the above documentation for accuracy and completeness, and I agree with the above. Brunetta Genera MD

## 2018-11-16 NOTE — Telephone Encounter (Signed)
Scheduled appt per 8/25 los.  Patient aware of the appt date and time.

## 2018-11-17 LAB — MULTIPLE MYELOMA PANEL, SERUM
Albumin SerPl Elph-Mcnc: 3.5 g/dL (ref 2.9–4.4)
Albumin/Glob SerPl: 0.7 (ref 0.7–1.7)
Alpha 1: 0.3 g/dL (ref 0.0–0.4)
Alpha2 Glob SerPl Elph-Mcnc: 0.9 g/dL (ref 0.4–1.0)
B-Globulin SerPl Elph-Mcnc: 4.2 g/dL — ABNORMAL HIGH (ref 0.7–1.3)
Gamma Glob SerPl Elph-Mcnc: 0.2 g/dL — ABNORMAL LOW (ref 0.4–1.8)
Globulin, Total: 5.6 g/dL — ABNORMAL HIGH (ref 2.2–3.9)
IgA: 3466 mg/dL — ABNORMAL HIGH (ref 61–437)
IgG (Immunoglobin G), Serum: 435 mg/dL — ABNORMAL LOW (ref 603–1613)
IgM (Immunoglobulin M), Srm: 14 mg/dL — ABNORMAL LOW (ref 15–143)
M Protein SerPl Elph-Mcnc: 2.9 g/dL — ABNORMAL HIGH
Total Protein ELP: 9.1 g/dL — ABNORMAL HIGH (ref 6.0–8.5)

## 2018-11-25 MED FILL — DEXAMETHASONE 4 MG TABLET: 4 | 28 days supply | Qty: 20 | Fill #1

## 2018-11-25 MED FILL — NINLARO 3 MG CAPSULE: 3 | 28 days supply | Qty: 3 | Fill #1

## 2018-12-12 NOTE — Progress Notes (Signed)
HEMATOLOGY/ONCOLOGY CLINIC NOTE  Date of Service: 12/14/2018  Patient Care Team: Venia Carbon, MD as PCP - Bethena Roys, MD as Consulting Physician (Cardiology) Marchia Bond, MD as Consulting Physician (Orthopedic Surgery)  CHIEF COMPLAINTS/PURPOSE OF CONSULTATION:  Multiple Myeloma  HISTORY OF PRESENTING ILLNESS:   Martin Conner is a wonderful 85 y.o. male who has been referred to [place] by Dr. Nicholas Lose for evaluation and management of his newly diagnosed Multiple Myeloma. The pt reports that he is doing well overall.   The pt reports that he has not felt differently recently as compared to his baseline 6-12 months ago. The pt notes that he had a physical in late January 2020 with his PCP and routine labs which revealed a concern for high protein. This was evaluated further and the pt was referred to my colleague Dr. Lindi Adie who has completed initial work up.  The pt denies problems passing urine, bowel abnormalities, or neuropathy. He also denies back pains.  The pt notes that he walks very little, ambulates with a cane or a walker, but can get around in his home well. He lives with his wife in his own home. The pt denies having falls, and uses a cane/walker to prevent falls. The pt notes that he has two children, and notes that they are not very involved with his medical decision making.  The pt denies heart, liver, lung, thyroid problems. He does have Afib however, and takes Eliquis.  Most recent lab results (06/03/18) of CBC w/diff is as follows: all values are WNL except for WBC at 3.8k, RBC at 3.73, HGB at 12.5, HCT at 37.4, MCV at 100.3, PLT at 145k. 06/03/18 MMP revealed all values WNL except for IgA at 2646, Total Protein at 8.7, B-globulin at 3.6, M Protein at 2.6g, and Globulin Total at 5.2. 06/03/18 Beta-2 microglobulin at 3.1 06/03/18 SFLC revealed Kappa at 1390.2, and K:L ratio at 105.32  On review of systems, pt reports stable energy levels, and denies  urination abnormalities, back pain, changes in bowel habits, abdominal pains, and any other symptoms.   On PMHx the pt reports Afib. On Social Hx the pt reports infrequent single alcoholic beverage. Denies ever smoking cigarettes. 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 On Family Hx the pt denies significant concerns.  Interval History:    Martin Conner returns today for management and evaluation of his Multiple Myeloma. The patient's last visit with [place] was on 11/16/2018. The pt reports that he is doing well overall. We are joined by his daughter Ria Comment & wife via phone.   The pt reports that he has been feeling about the same since beginning his treatment and is having no issues with his medication. Pt states that has been eating three meals a day and sleeping well. He reports that he does experience occasional fatigue that has not changed since beginning the medication. Pt is no longer having issues with his urine retention. His catheter has been removed and he is able to urinate on his own. He has an upcoming appointment with his Urologist.   Lab results today (12/14/18) of CBC w/diff and CMP is as follows: all values are WNL except for RBC at 3.76, Hgb at 12.5, HCT at 36.5, PLTs at 98, Neutro Abs at 7.8K, Lymphs Abs at 0.6K, Potassium at 5.3, Glucose at 124, Total Protein at 9.6, Albumin at 3.3, AST at 13. 12/14/2018 MMP is in progress  12/14/2018 K/Llight chains is  in progress  On review of systems, pt reports mild fatigue and denies diarrhea, nausea, numbness/tingling in hands/feet, abdominal pain and any other symptoms.    MEDICAL HISTORY:  Past Medical History:  Diagnosis Date  . Fracture of femoral neck, right ([place]) 07/10/2015   s/p mechanical fall  . Hyperlipidemia   . Hypertensive heart disease   . Lumbar disc disease   . Osteoarthritis   . Permanent atrial fibrillation    a. 07/2012 Echo: EF 55-60%, no rwma, mildly dil RA/LA.  Marland Kitchen Syncope    a. 07/2012 - unclear  etiology-->resulted in right tib/fib fx req ORIF/intramedullary nail of tibia.    SURGICAL HISTORY: Past Surgical History:  Procedure Laterality Date  . ANKLE SURGERY     right ankle  . TIBIA IM NAIL INSERTION Right 07/21/2012   Procedure: INTRAMEDULLARY (IM) NAIL TIBIAL;  Surgeon: Johnny Bridge, MD;  Location: [facility];  Service: Orthopedics;  Laterality: Right;  . TOTAL HIP ARTHROPLASTY     left  . TOTAL HIP ARTHROPLASTY Right 07/11/2015   Procedure: TOTAL HIP ARTHROPLASTY;  Surgeon: Marchia Bond, MD;  Location: [facility];  Service: Orthopedics;  Laterality: Right;    SOCIAL HISTORY: Social History   Socioeconomic History  . Marital status: Married    Spouse name: Not on file  . Number of children: 2  . Years of education: Not on file  . Highest education level: Not on file  Occupational History  . Occupation: retired     Fish farm manager: [employer]  . Financial resource strain: Not on file  . Food insecurity    Worry: Not on file    Inability: Not on file  . Transportation needs    Medical: Not on file    Non-medical: Not on file  Tobacco Use  . Smoking status: Never Smoker  . Smokeless tobacco: Never Used  Substance and Sexual Activity  . Alcohol use: Yes    Alcohol/week: 12.0 standard drinks    Types: 12 Cans of beer per week    Comment: 2 beers several x/wk and occas cocktail on top of it.  . Drug use: No  . Sexual activity: Never  Lifestyle  . Physical activity    Days per week: Not on file    Minutes per session: Not on file  . Stress: Not on file  Relationships  . Social Herbalist on phone: Not on file    Gets together: Not on file    Attends religious service: Not on file    Active member of club or organization: Not on file    Attends meetings of clubs or organizations: Not on file    Relationship status: Not on file  . Intimate partner violence    Fear of current or ex partner: Not on file    Emotionally abused: Not on file     Physically abused: Not on file    Forced sexual activity: Not on file  Other Topics Concern  . Not on file  Social History Narrative   Not sure about living will   Requests that wife make decisions for him if needed   Would accept resuscitation but no prolonged artificial ventilation   Would not want tube feeds if cognitively unaware    FAMILY HISTORY: Family History  Problem Relation Age of Onset  . Cancer Mother   . Hyperlipidemia Sister   . Hypertension Sister     ALLERGIES:  has No Known Allergies.  MEDICATIONS:  Current Outpatient Medications  Medication Sig Dispense Refill  . Acetaminophen (TYLENOL PO) Take 500 mg by mouth daily as needed (pain). Pt unsure of strength     . dexamethasone (DECADRON) 4 MG tablet Take 5 tablets (20 mg total) by mouth once a week. Take with breakfast. 20 tablet 3  . HYDROcodone-acetaminophen (NORCO/VICODIN) 5-325 MG tablet Take 1-2 tablets by mouth every 6 (six) hours as needed for moderate pain. 30 tablet 0  . ibuprofen (ADVIL) 200 MG tablet Take 400 mg by mouth every 6 (six) hours as needed for moderate pain.    Marland Kitchen ixazomib citrate (NINLARO) 3 MG capsule Take 1 capsule (3 mg) by mouth weekly, 3 weeks on, 1 week off, repeat every 4 weeks. Take on an empty stomach 1hr before or 2hr after meals. 3 capsule 1  . ondansetron (ZOFRAN) 8 MG tablet Take 1 tablet (8 mg total) by mouth 2 (two) times daily as needed (Nausea or vomiting). 30 tablet 1  . polyethylene glycol (MIRALAX / GLYCOLAX) 17 g packet Take 17 g by mouth daily.    . sennosides-docusate sodium (SENOKOT-S) 8.6-50 MG tablet Take 2 tablets by mouth as needed for constipation.    . tamsulosin (FLOMAX) 0.4 MG CAPS capsule Take 1 capsule (0.4 mg total) by mouth daily after supper. 30 capsule 11   No current facility-administered medications for this visit.     REVIEW OF SYSTEMS:    A 10+ POINT REVIEW OF SYSTEMS WAS OBTAINED including neurology, dermatology, psychiatry, cardiac, respiratory,  lymph, extremities, GI, GU, Musculoskeletal, constitutional, breasts, reproductive, HEENT.  All pertinent positives are noted in the HPI.  All others are negative.   PHYSICAL EXAMINATION: ECOG PERFORMANCE STATUS: 3 - Symptomatic, >50% confined to bed  Vitals:   12/14/18 0847 12/14/18 0848  BP: (!) 152/76 (!) 172/83  Pulse: 87   Resp: 17   Temp: 98.2 F (36.8 C)   SpO2: 96%    Filed Weights   12/14/18 0847  Weight: 162 lb (73.5 kg)   .Body mass index is 21.37 kg/m.   Exam was given in a wheelchair   GENERAL:alert, in no acute distress and comfortable SKIN: no acute rashes, no significant lesions, dry skin EYES: conjunctiva are pink and non-injected, sclera anicteric OROPHARYNX: MMM, no exudates, no oropharyngeal erythema or ulceration NECK: supple, no JVD LYMPH:  no palpable lymphadenopathy in the cervical, axillary or inguinal regions LUNGS: clear to auscultation b/l with normal respiratory effort HEART: regular rate & rhythm ABDOMEN:  normoactive bowel sounds , non tender, not distended. No palpable hepatosplenomegaly.  Extremity: no pedal edema PSYCH: alert & oriented x 3 with fluent speech NEURO: no focal motor/sensory deficits  LABORATORY DATA:  I have reviewed the data as listed  . CBC Latest Ref Rng & Units 12/14/2018 11/16/2018 10/23/2018  WBC 4.0 - 10.5 K/uL 8.5 13.0(H) 10.7(H)  Hemoglobin 13.0 - 17.0 g/dL 12.5(L) 11.5(L) 11.0(L)  Hematocrit 39.0 - 52.0 % 36.5(L) 34.4(L) 32.7(L)  Platelets 150 - 400 K/uL 98(L) 119(L) 163  ANC1.5k  . CMP Latest Ref Rng & Units 11/16/2018 10/23/2018 10/22/2018  Glucose 70 - 99 mg/dL 111(H) 90 78  BUN 8 - 23 mg/dL 28(H) 12 23  Creatinine 0.61 - 1.24 mg/dL 1.23 0.88 1.16  Sodium 135 - 145 mmol/L 136 134(L) 136  Potassium 3.5 - 5.1 mmol/L 4.4 4.3 4.9  Chloride 98 - 111 mmol/L 99 105 108  CO2 22 - 32 mmol/L 23 20(L) 17(L)  Calcium 8.9 - 10.3 mg/dL 9.8  9.3 9.3  Total Protein 6.5 - 8.1 g/dL 9.5(H) - -  Total Bilirubin 0.3 - 1.2 mg/dL  0.6 - -  Alkaline Phos 38 - 126 U/L 52 - -  AST 15 - 41 U/L 16 - -  ALT 0 - 44 U/L 19 - -    06/03/18 BM Bx:    06/03/18 Cytogenetics:      RADIOGRAPHIC STUDIES: I have personally reviewed the radiological images as listed and agreed with the findings in the report. No results found.  ASSESSMENT & PLAN:   85 y.o. male with  1. Multiple Myeloma 06/03/18 BM Biopsy revealed cellular marrow involved by plasma cell neoplasm (about 50%) 06/03/18 Cytogenetics revealed 54.5% of cells showed gain of ATM, and 51.5% of cells with loss of p53 06/03/18 Bone Survey revealed Possible 8 mm lytic lesion versus subarachnoid granulation within the parietal convexity of the calvarium. No other suspicious lytic lesions seen within the skeleton. Labs upon initial presentation from 06/03/18, M Protein at 2.6g with IgA kappa specificity. Some new mild anemia with HGB at 12.5.  06/30/18 M Protein at 3.1g  07/20/18 PET/CT revealed "No hypermetabolic bone disease evident. 2. 12 mm hypermetabolic right thyroid nodule, nonspecific. Thyroid ultrasound could be used to further evaluate as clinically warranted in this individual. No other unexpected hypermetabolic soft tissue disease identified on today's study. 3. Bilateral tiny pulmonary nodules measuring up to about 3 mm maximum size. Consider follow-up to ensure stability. 4.  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  -12/14/2018 MMP & K/L light chains are in progress  -Discussed 11/16/2018 MMP is as follows: IgG at 435, IgA at 3466, IgM at 14, Total Protein ELP at 9.1, Albumin  at 3.5, Alpha 1 at 0.3, Alpha2 at 0.9, B-Globulin at 4.2, Gamma Glob at 0.2, M Protein at 2.9, Total Globulin at 5.6, Albumin/Glob at 0.7, IFE 1 shows "Immunofixation shows IgA monoclonal protein with kappa light chain  Specificity." -Advised pt to cut down on orange juice and other Potassium rich drinks/food -Recommended pt moisturize his dry skin -If M protein continues to decline we will continue the treatment as is  -If M protein stays the same we will consider adding dose-adjusted Revlimid  -Pt has no prohibitive toxicities from Ninlaro and Dexamethasone -Discussed that the pt has a 17p mutation, which can be a predictor of treatment resistance and a possibly more aggressive course. -prescribed Ninlaro 4m weekly on D1,8,15 every 28 days with Dexamethasone 253mweekly.  -Will see back in 1 month with labs   FOLLOW UP: RTC in 1 month with labs If M protein fails to decline will add dose-adjusted Revlimid    The total time spent in the appt was 25 minutes and more than 50% was on counseling and direct patient cares.  All of the patient's questions were answered with apparent satisfaction. The patient knows to call the clinic with any problems, questions or concerns.   GaSullivan LoneD MSLake WorthAHIVMS SCLake Endoscopy CenterTGulf Coast Endoscopy Center Of Venice LLCematology/Oncology Physician CoCape Coral Hospital(Office):       [phone] cell):  [phone]):  [phone]  12/14/2018 8:53 AM   I, Yevette Edwards, am acting as a scribe for Dr. Sullivan Lone.   .I have reviewed the above documentation for accuracy and completeness, and I agree with the above. Brunetta Genera MD

## 2018-12-14 ENCOUNTER — Other Ambulatory Visit: Payer: Self-pay

## 2018-12-14 ENCOUNTER — Telehealth: Payer: Self-pay | Admitting: Hematology

## 2018-12-14 ENCOUNTER — Inpatient Hospital Stay (HOSPITAL_BASED_OUTPATIENT_CLINIC_OR_DEPARTMENT_OTHER): Payer: Medicare Other | Admitting: Hematology

## 2018-12-14 ENCOUNTER — Inpatient Hospital Stay: Payer: Medicare Other | Attending: Hematology and Oncology

## 2018-12-14 VITALS — BP 172/83 | HR 87 | Temp 98.2°F | Resp 17 | Ht 73.0 in | Wt 162.0 lb

## 2018-12-14 DIAGNOSIS — E785 Hyperlipidemia, unspecified: Secondary | ICD-10-CM | POA: Diagnosis not present

## 2018-12-14 DIAGNOSIS — I4821 Permanent atrial fibrillation: Secondary | ICD-10-CM | POA: Insufficient documentation

## 2018-12-14 DIAGNOSIS — C9000 Multiple myeloma not having achieved remission: Secondary | ICD-10-CM

## 2018-12-14 DIAGNOSIS — Z79899 Other long term (current) drug therapy: Secondary | ICD-10-CM | POA: Diagnosis not present

## 2018-12-14 LAB — CBC WITH DIFFERENTIAL/PLATELET
Abs Immature Granulocytes: 0.03 10*3/uL (ref 0.00–0.07)
Basophils Absolute: 0 10*3/uL (ref 0.0–0.1)
Basophils Relative: 0 %
Eosinophils Absolute: 0 10*3/uL (ref 0.0–0.5)
Eosinophils Relative: 0 %
HCT: 36.5 % — ABNORMAL LOW (ref 39.0–52.0)
Hemoglobin: 12.5 g/dL — ABNORMAL LOW (ref 13.0–17.0)
Immature Granulocytes: 0 %
Lymphocytes Relative: 7 %
Lymphs Abs: 0.6 10*3/uL — ABNORMAL LOW (ref 0.7–4.0)
MCH: 33.2 pg (ref 26.0–34.0)
MCHC: 34.2 g/dL (ref 30.0–36.0)
MCV: 97.1 fL (ref 80.0–100.0)
Monocytes Absolute: 0.1 10*3/uL (ref 0.1–1.0)
Monocytes Relative: 1 %
Neutro Abs: 7.8 10*3/uL — ABNORMAL HIGH (ref 1.7–7.7)
Neutrophils Relative %: 92 %
Platelets: 98 10*3/uL — ABNORMAL LOW (ref 150–400)
RBC: 3.76 MIL/uL — ABNORMAL LOW (ref 4.22–5.81)
RDW: 15.5 % (ref 11.5–15.5)
WBC: 8.5 10*3/uL (ref 4.0–10.5)
nRBC: 0 % (ref 0.0–0.2)

## 2018-12-14 LAB — CMP (CANCER CENTER ONLY)
ALT: 11 U/L (ref 0–44)
AST: 13 U/L — ABNORMAL LOW (ref 15–41)
Albumin: 3.3 g/dL — ABNORMAL LOW (ref 3.5–5.0)
Alkaline Phosphatase: 49 U/L (ref 38–126)
Anion gap: 14 (ref 5–15)
BUN: 17 mg/dL (ref 8–23)
CO2: 23 mmol/L (ref 22–32)
Calcium: 10 mg/dL (ref 8.9–10.3)
Chloride: 100 mmol/L (ref 98–111)
Creatinine: 0.98 mg/dL (ref 0.61–1.24)
GFR, Est AFR Am: >60 mL/min (ref >60)
GFR, Estimated: >60 mL/min (ref >60)
Glucose, Bld: 124 mg/dL — ABNORMAL HIGH (ref 70–99)
Potassium: 5.3 mmol/L — ABNORMAL HIGH (ref 3.5–5.1)
Sodium: 137 mmol/L (ref 135–145)
Total Bilirubin: 0.6 mg/dL (ref 0.3–1.2)
Total Protein: 9.6 g/dL — ABNORMAL HIGH (ref 6.5–8.1)

## 2018-12-14 NOTE — Telephone Encounter (Signed)
Scheduled appt per 9/22 los.  Spoke with patient wife and she is aware of the appt date and time

## 2018-12-15 LAB — MULTIPLE MYELOMA PANEL, SERUM
Albumin SerPl Elph-Mcnc: 3.6 g/dL (ref 2.9–4.4)
Albumin/Glob SerPl: 0.7 (ref 0.7–1.7)
Alpha 1: 0.2 g/dL (ref 0.0–0.4)
Alpha2 Glob SerPl Elph-Mcnc: 1 g/dL (ref 0.4–1.0)
B-Globulin SerPl Elph-Mcnc: 3.8 g/dL — ABNORMAL HIGH (ref 0.7–1.3)
Gamma Glob SerPl Elph-Mcnc: 0.2 g/dL — ABNORMAL LOW (ref 0.4–1.8)
Globulin, Total: 5.2 g/dL — ABNORMAL HIGH (ref 2.2–3.9)
IgA: 3150 mg/dL — ABNORMAL HIGH (ref 61–437)
IgG (Immunoglobin G), Serum: 393 mg/dL — ABNORMAL LOW (ref 603–1613)
IgM (Immunoglobulin M), Srm: 11 mg/dL — ABNORMAL LOW (ref 15–143)
M Protein SerPl Elph-Mcnc: 2.7 g/dL — ABNORMAL HIGH
Total Protein ELP: 8.8 g/dL — ABNORMAL HIGH (ref 6.0–8.5)

## 2018-12-15 LAB — KAPPA/LAMBDA LIGHT CHAINS
Kappa free light chain: 1346.4 mg/L — ABNORMAL HIGH (ref 3.3–19.4)
Kappa, lambda light chain ratio: 498.67 — ABNORMAL HIGH (ref 0.26–1.65)
Lambda free light chains: 2.7 mg/L — ABNORMAL LOW (ref 5.7–26.3)

## 2018-12-21 ENCOUNTER — Encounter: Payer: Self-pay | Admitting: Internal Medicine

## 2018-12-21 ENCOUNTER — Other Ambulatory Visit: Payer: Self-pay

## 2018-12-21 ENCOUNTER — Other Ambulatory Visit: Payer: Self-pay | Admitting: Hematology

## 2018-12-21 ENCOUNTER — Ambulatory Visit (INDEPENDENT_AMBULATORY_CARE_PROVIDER_SITE_OTHER): Payer: Medicare Other | Admitting: Internal Medicine

## 2018-12-21 VITALS — BP 134/82 | HR 79 | Temp 98.0°F | Ht 73.0 in | Wt 165.0 lb

## 2018-12-21 DIAGNOSIS — C9000 Multiple myeloma not having achieved remission: Secondary | ICD-10-CM

## 2018-12-21 DIAGNOSIS — I482 Chronic atrial fibrillation, unspecified: Secondary | ICD-10-CM | POA: Diagnosis not present

## 2018-12-21 DIAGNOSIS — R339 Retention of urine, unspecified: Secondary | ICD-10-CM | POA: Diagnosis not present

## 2018-12-21 DIAGNOSIS — Z23 Encounter for immunization: Secondary | ICD-10-CM | POA: Diagnosis not present

## 2018-12-21 DIAGNOSIS — E441 Mild protein-calorie malnutrition: Secondary | ICD-10-CM | POA: Diagnosis not present

## 2018-12-21 NOTE — Assessment & Plan Note (Signed)
Improved now with medication Infection cleared May have been a big part of his major decline

## 2018-12-21 NOTE — Assessment & Plan Note (Signed)
Tolerating oral home therapy Follows with Dr Irene Limbo

## 2018-12-21 NOTE — Assessment & Plan Note (Signed)
Rate is controlled 

## 2018-12-21 NOTE — Assessment & Plan Note (Signed)
Weight has stabilized now Eating is sporadic but wife encourages him

## 2018-12-21 NOTE — Progress Notes (Signed)
Subjective:    Patient ID: Martin Conner, male    DOB: 19-Jan-1928, 85 y.o.   MRN: [id number]  HPI Here with wife for follow up of multiple medical conditions  Now using once a week oral Rx for the myeloma Then skips 4th week Dr Irene Limbo thinks it may be helping some  No problems with UTI for a while Catheter out--voiding okay Has followed with urology Hasn't needed the hydrocodone  Eating "fairly good" Weight has stablilized  Current Outpatient Medications on File Prior to Visit  Medication Sig Dispense Refill  . Acetaminophen (TYLENOL PO) Take 500 mg by mouth daily as needed (pain). Pt unsure of strength     . dexamethasone (DECADRON) 4 MG tablet Take 5 tablets (20 mg total) by mouth once a week. Take with breakfast. 20 tablet 3  . HYDROcodone-acetaminophen (NORCO/VICODIN) 5-325 MG tablet Take 1-2 tablets by mouth every 6 (six) hours as needed for moderate pain. 30 tablet 0  . ibuprofen (ADVIL) 200 MG tablet Take 400 mg by mouth every 6 (six) hours as needed for moderate pain.    Marland Kitchen ixazomib citrate (NINLARO) 3 MG capsule Take 1 capsule (3 mg) by mouth weekly, 3 weeks on, 1 week off, repeat every 4 weeks. Take on an empty stomach 1hr before or 2hr after meals. 3 capsule 1  . ondansetron (ZOFRAN) 8 MG tablet Take 1 tablet (8 mg total) by mouth 2 (two) times daily as needed (Nausea or vomiting). 30 tablet 1  . polyethylene glycol (MIRALAX / GLYCOLAX) 17 g packet Take 17 g by mouth daily.    . sennosides-docusate sodium (SENOKOT-S) 8.6-50 MG tablet Take 2 tablets by mouth as needed for constipation.    . tamsulosin (FLOMAX) 0.4 MG CAPS capsule Take 1 capsule (0.4 mg total) by mouth daily after supper. 30 capsule 11   No current facility-administered medications on file prior to visit.     No Known Allergies  Past Medical History:  Diagnosis Date  . Fracture of femoral neck, right ([place]) 07/10/2015   s/p mechanical fall  . Hyperlipidemia   . Hypertensive heart disease   . Lumbar disc  disease   . Osteoarthritis   . Permanent atrial fibrillation    a. 07/2012 Echo: EF 55-60%, no rwma, mildly dil RA/LA.  Marland Kitchen Syncope    a. 07/2012 - unclear etiology-->resulted in right tib/fib fx req ORIF/intramedullary nail of tibia.    Past Surgical History:  Procedure Laterality Date  . ANKLE SURGERY     right ankle  . TIBIA IM NAIL INSERTION Right 07/21/2012   Procedure: INTRAMEDULLARY (IM) NAIL TIBIAL;  Surgeon: Johnny Bridge, MD;  Location: [facility];  Service: Orthopedics;  Laterality: Right;  . TOTAL HIP ARTHROPLASTY     left  . TOTAL HIP ARTHROPLASTY Right 07/11/2015   Procedure: TOTAL HIP ARTHROPLASTY;  Surgeon: Marchia Bond, MD;  Location: [facility];  Service: Orthopedics;  Laterality: Right;    Family History  Problem Relation Age of Onset  . Cancer Mother   . Hyperlipidemia Sister   . Hypertension Sister     Social History   Socioeconomic History  . Marital status: Married    Spouse name: Not on file  . Number of children: 2  . Years of education: Not on file  . Highest education level: Not on file  Occupational History  . Occupation: retired     Fish farm manager: [employer]  . Financial resource strain: Not on file  .  Food insecurity    Worry: Not on file    Inability: Not on file  . Transportation needs    Medical: Not on file    Non-medical: Not on file  Tobacco Use  . Smoking status: Never Smoker  . Smokeless tobacco: Never Used  Substance and Sexual Activity  . Alcohol use: Yes    Alcohol/week: 12.0 standard drinks    Types: 12 Cans of beer per week    Comment: 2 beers several x/wk and occas cocktail on top of it.  . Drug use: No  . Sexual activity: Never  Lifestyle  . Physical activity    Days per week: Not on file    Minutes per session: Not on file  . Stress: Not on file  Relationships  . Social Herbalist on phone: Not on file    Gets together: Not on file    Attends religious service: Not on file    Active member of club or  organization: Not on file    Attends meetings of clubs or organizations: Not on file    Relationship status: Not on file  . Intimate partner violence    Fear of current or ex partner: Not on file    Emotionally abused: Not on file    Physically abused: Not on file    Forced sexual activity: Not on file  Other Topics Concern  . Not on file  Social History Narrative   Not sure about living will   Requests that wife make decisions for him if needed   Would accept resuscitation but no prolonged artificial ventilation   Would not want tube feeds if cognitively unaware   Review of Systems Sleeps a lot still Walks in house with walker Afraid to get in shower--worries about falling. Goes in at least once a week--sponge bath otherwise Goes to bathroom by himself Dresses himself after wife sets him up    Objective:   Physical Exam  Constitutional: No distress.  Neck: No thyromegaly present.  Cardiovascular: Normal rate. Exam reveals no gallop.  No murmur heard. irregular  Respiratory: Effort normal and breath sounds normal. No respiratory distress. He has no wheezes. He has no rales.  GI: Soft. There is no abdominal tenderness.  Musculoskeletal:        General: No edema.  Lymphadenopathy:    He has no cervical adenopathy.  Psychiatric: He has a normal mood and affect. His behavior is normal.           Assessment & Plan:

## 2018-12-23 MED FILL — NINLARO 3 MG CAPSULE: 3 | 28 days supply | Qty: 3 | Fill #0

## 2018-12-27 MED FILL — DEXAMETHASONE 4 MG TABLET: 4 | 28 days supply | Qty: 20 | Fill #2

## 2019-01-12 NOTE — Progress Notes (Signed)
HEMATOLOGY/ONCOLOGY CLINIC NOTE  Date of Service: 01/13/2019  Patient Care Team: Venia Carbon, MD as PCP - Bethena Roys, MD as Consulting Physician (Cardiology) Marchia Bond, MD as Consulting Physician (Orthopedic Surgery)  CHIEF COMPLAINTS/PURPOSE OF CONSULTATION:  Multiple Myeloma  HISTORY OF PRESENTING ILLNESS:   Martin Conner is a wonderful 85 y.o. male who has been referred to [place] by Dr. Nicholas Lose for evaluation and management of his newly diagnosed Multiple Myeloma. The pt reports that he is doing well overall.   The pt reports that he has not felt differently recently as compared to his baseline 6-12 months ago. The pt notes that he had a physical in late January 2020 with his PCP and routine labs which revealed a concern for high protein. This was evaluated further and the pt was referred to my colleague Dr. Lindi Adie who has completed initial work up.  The pt denies problems passing urine, bowel abnormalities, or neuropathy. He also denies back pains.  The pt notes that he walks very little, ambulates with a cane or a walker, but can get around in his home well. He lives with his wife in his own home. The pt denies having falls, and uses a cane/walker to prevent falls. The pt notes that he has two children, and notes that they are not very involved with his medical decision making.  The pt denies heart, liver, lung, thyroid problems. He does have Afib however, and takes Eliquis.  Most recent lab results (06/03/18) of CBC w/diff is as follows: all values are WNL except for WBC at 3.8k, RBC at 3.73, HGB at 12.5, HCT at 37.4, MCV at 100.3, PLT at 145k. 06/03/18 MMP revealed all values WNL except for IgA at 2646, Total Protein at 8.7, B-globulin at 3.6, M Protein at 2.6g, and Globulin Total at 5.2. 06/03/18 Beta-2 microglobulin at 3.1 06/03/18 SFLC revealed Kappa at 1390.2, and K:L ratio at 105.32  On review of systems, pt reports stable energy levels, and denies  urination abnormalities, back pain, changes in bowel habits, abdominal pains, and any other symptoms.   On PMHx the pt reports Afib. On Social Hx the pt reports infrequent single alcoholic beverage. Denies ever smoking cigarettes. 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 On Family Hx the pt denies significant concerns.  Interval History:    Martin Conner returns today for management and evaluation of his Multiple Myeloma. The patient's last visit with [place] was on 12/14/18. The pt reports that he is doing well overall.  The pt reports that he is doing well with treatment and noted no new symptoms.  Lab results today (01/13/19) of CBC w/diff and CMP is as follows: all values are WNL except for RBC at 3.58, Hemoglobin at 11.9, HCT at 35, RDW at 16.2, Platelets at 95, Glucose at 109, Total Protein at 8.6, Albumin at 2.9, Ast at 11 MMP -- M spike slightly lower at 2.6..  On review of systems, pt denies nauseas, vomiting, no urinary issues, diarrhea, tingling in extremities, back pain, bone pain, fevers, abdominal pain, leg swelling and any other symptoms.    MEDICAL HISTORY:  Past Medical History:  Diagnosis Date  . Fracture of femoral neck, right ([place]) 07/10/2015   s/p mechanical fall  . Hyperlipidemia   . Hypertensive heart disease   . Lumbar disc disease   . Osteoarthritis   . Permanent atrial fibrillation    a. 07/2012 Echo: EF 55-60%, no rwma, mildly dil  RA/LA.  . Syncope    a. 07/2012 - unclear etiology-->resulted in right tib/fib fx req ORIF/intramedullary nail of tibia.    SURGICAL HISTORY: Past Surgical History:  Procedure Laterality Date  . ANKLE SURGERY     right ankle  . TIBIA IM NAIL INSERTION Right 07/21/2012   Procedure: INTRAMEDULLARY (IM) NAIL TIBIAL;  Surgeon: Johnny Bridge, MD;  Location: [facility];  Service: Orthopedics;  Laterality: Right;  . TOTAL HIP ARTHROPLASTY     left  . TOTAL HIP ARTHROPLASTY Right 07/11/2015   Procedure: TOTAL HIP ARTHROPLASTY;   Surgeon: Marchia Bond, MD;  Location: [facility];  Service: Orthopedics;  Laterality: Right;    SOCIAL HISTORY: Social History   Socioeconomic History  . Marital status: Married    Spouse name: Not on file  . Number of children: 2  . Years of education: Not on file  . Highest education level: Not on file  Occupational History  . Occupation: retired     Fish farm manager: [employer]  . Financial resource strain: Not on file  . Food insecurity    Worry: Not on file    Inability: Not on file  . Transportation needs    Medical: Not on file    Non-medical: Not on file  Tobacco Use  . Smoking status: Never Smoker  . Smokeless tobacco: Never Used  Substance and Sexual Activity  . Alcohol use: Yes    Alcohol/week: 12.0 standard drinks    Types: 12 Cans of beer per week    Comment: 2 beers several x/wk and occas cocktail on top of it.  . Drug use: No  . Sexual activity: Never  Lifestyle  . Physical activity    Days per week: Not on file    Minutes per session: Not on file  . Stress: Not on file  Relationships  . Social Herbalist on phone: Not on file    Gets together: Not on file    Attends religious service: Not on file    Active member of club or organization: Not on file    Attends meetings of clubs or organizations: Not on file    Relationship status: Not on file  . Intimate partner violence    Fear of current or ex partner: Not on file    Emotionally abused: Not on file    Physically abused: Not on file    Forced sexual activity: Not on file  Other Topics Concern  . Not on file  Social History Narrative   Not sure about living will   Requests that wife make decisions for him if needed   Would accept resuscitation but no prolonged artificial ventilation   Would not want tube feeds if cognitively unaware    FAMILY HISTORY: Family History  Problem Relation Age of Onset  . Cancer Mother   . Hyperlipidemia Sister   . Hypertension Sister      ALLERGIES:  has No Known Allergies.  MEDICATIONS:  Current Outpatient Medications  Medication Sig Dispense Refill  . Acetaminophen (TYLENOL PO) Take 500 mg by mouth daily as needed (pain). Pt unsure of strength     . dexamethasone (DECADRON) 4 MG tablet Take 5 tablets (20 mg total) by mouth once a week. Take with breakfast. 20 tablet 3  . HYDROcodone-acetaminophen (NORCO/VICODIN) 5-325 MG tablet Take 1-2 tablets by mouth every 6 (six) hours as needed for moderate pain. 30 tablet 0  . ibuprofen (ADVIL) 200 MG tablet  Take 400 mg by mouth every 6 (six) hours as needed for moderate pain.    Marland Kitchen NINLARO 3 MG capsule TAKE 1 CAPSULE (3 MG) BY MOUTH WEEKLY, 3 WEEKS ON, 1 WEEK OFF, REPEAT EVERY 4 WEEKS. TAKE ON AN EMPTY STOMACH 1HR BEFORE OR 2HR AFTER MEALS. 3 capsule 1  . ondansetron (ZOFRAN) 8 MG tablet Take 1 tablet (8 mg total) by mouth 2 (two) times daily as needed (Nausea or vomiting). 30 tablet 1  . polyethylene glycol (MIRALAX / GLYCOLAX) 17 g packet Take 17 g by mouth daily.    . sennosides-docusate sodium (SENOKOT-S) 8.6-50 MG tablet Take 2 tablets by mouth as needed for constipation.    . tamsulosin (FLOMAX) 0.4 MG CAPS capsule Take 1 capsule (0.4 mg total) by mouth daily after supper. 30 capsule 11   No current facility-administered medications for this visit.     REVIEW OF SYSTEMS:    A 10+ POINT REVIEW OF SYSTEMS WAS OBTAINED including neurology, dermatology, psychiatry, cardiac, respiratory, lymph, extremities, GI, GU, Musculoskeletal, constitutional, breasts, reproductive, HEENT.  All pertinent positives are noted in the HPI.  All others are negative.   PHYSICAL EXAMINATION: ECOG FS:3 - Symptomatic, >50% confined to bed  There were no vitals filed for this visit. Wt Readings from Last 3 Encounters:  12/21/18 165 lb (74.8 kg)  12/14/18 162 lb (73.5 kg)  11/16/18 163 lb 1.6 oz (74 kg)   Body mass index is 22.32 kg/m.    GENERAL:alert, in no acute distress and comfortable SKIN:  no acute rashes, no significant lesions EYES: conjunctiva are pink and non-injected, sclera anicteric OROPHARYNX: MMM, no exudates, no oropharyngeal erythema or ulceration NECK: supple, no JVD LYMPH:  no palpable lymphadenopathy in the cervical, axillary or inguinal regions LUNGS: clear to auscultation b/l with normal respiratory effort HEART: regular rate & rhythm ABDOMEN:  normoactive bowel sounds , non tender, not distended. Extremity: no pedal edema PSYCH: alert & oriented x 3 with fluent speech NEURO: no focal motor/sensory deficits LABORATORY DATA:  I have reviewed the data as listed  . CBC Latest Ref Rng & Units 12/14/2018 11/16/2018 10/23/2018  WBC 4.0 - 10.5 K/uL 8.5 13.0(H) 10.7(H)  Hemoglobin 13.0 - 17.0 g/dL 12.5(L) 11.5(L) 11.0(L)  Hematocrit 39.0 - 52.0 % 36.5(L) 34.4(L) 32.7(L)  Platelets 150 - 400 K/uL 98(L) 119(L) 163  ANC1.5k  . CMP Latest Ref Rng & Units 12/14/2018 11/16/2018 10/23/2018  Glucose 70 - 99 mg/dL 124(H) 111(H) 90  BUN 8 - 23 mg/dL 17 28(H) 12  Creatinine 0.61 - 1.24 mg/dL 0.98 1.23 0.88  Sodium 135 - 145 mmol/L 137 136 134(L)  Potassium 3.5 - 5.1 mmol/L 5.3(H) 4.4 4.3  Chloride 98 - 111 mmol/L 100 99 105  CO2 22 - 32 mmol/L 23 23 20(L)  Calcium 8.9 - 10.3 mg/dL 10.0 9.8 9.3  Total Protein 6.5 - 8.1 g/dL 9.6(H) 9.5(H) -  Total Bilirubin 0.3 - 1.2 mg/dL 0.6 0.6 -  Alkaline Phos 38 - 126 U/L 49 52 -  AST 15 - 41 U/L 13(L) 16 -  ALT 0 - 44 U/L 11 19 -    06/03/18 BM Bx:    06/03/18 Cytogenetics:      RADIOGRAPHIC STUDIES: I have personally reviewed the radiological images as listed and agreed with the findings in the report. No results found.  ASSESSMENT & PLAN:   85 y.o. male with  1. Multiple Myeloma 06/03/18 BM Biopsy revealed cellular marrow involved by plasma cell neoplasm (about 50%) 06/03/18  Cytogenetics revealed 54.5% of cells showed gain of ATM, and 51.5% of cells with loss of p53 06/03/18 Bone Survey revealed Possible 8 mm lytic  lesion versus subarachnoid granulation within the parietal convexity of the calvarium. No other suspicious lytic lesions seen within the skeleton. Labs upon initial presentation from 06/03/18, M Protein at 2.6g with IgA kappa specificity. Some new mild anemia with HGB at 12.5.  06/30/18 M Protein at 3.1g  07/20/18 PET/CT revealed "No hypermetabolic bone disease evident. 2. 12 mm hypermetabolic right thyroid nodule, nonspecific. Thyroid ultrasound could be used to further evaluate as clinically warranted in this individual. No other unexpected hypermetabolic soft tissue disease identified on today's study. 3. Bilateral tiny pulmonary nodules measuring up to about 3 mm maximum size. Consider follow-up to ensure stability. 4.  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 - MMP -- M spike at 2.6 -Discussed that WBC are normal and Platelets are a little low but not concerning. -Protein levels are low. Advised to continue to maintain healthy eating habits -M Protein is improving 2.9 to 2.7 to 2.6 s -Continue ninlaro and dexamethasone. -no prohibitive toxicities from current treatment at this time.  FOLLOW UP: RTC with Dr Irene Limbo with labs in 1 month    The total time spent in the appt was 25 minutes and more than 50% was on counseling and direct patient cares.  All of the patient's questions were answered with apparent satisfaction. The patient knows to call the clinic with any problems, questions or concerns.    Sullivan Lone MD [place] AAHIVMS [facility] Hematology/Oncology Physician [facility]  (Office):        [phone] (Work cell):  [phone] (Fax):           [phone]  01/12/2019 8:50 PM   I, Scot Dock, am acting as a scribe for Dr. Sullivan Lone.   .I have reviewed the above documentation for accuracy and completeness, and I agree with the above. Brunetta Genera MD

## 2019-01-13 ENCOUNTER — Other Ambulatory Visit: Payer: Self-pay

## 2019-01-13 ENCOUNTER — Telehealth: Payer: Self-pay | Admitting: Hematology

## 2019-01-13 ENCOUNTER — Inpatient Hospital Stay: Payer: Medicare Other | Attending: Hematology and Oncology

## 2019-01-13 ENCOUNTER — Inpatient Hospital Stay (HOSPITAL_BASED_OUTPATIENT_CLINIC_OR_DEPARTMENT_OTHER): Payer: Medicare Other | Admitting: Hematology

## 2019-01-13 VITALS — BP 106/66 | HR 101 | Temp 98.2°F | Resp 18 | Ht 73.0 in | Wt 169.2 lb

## 2019-01-13 DIAGNOSIS — Z79899 Other long term (current) drug therapy: Secondary | ICD-10-CM | POA: Diagnosis not present

## 2019-01-13 DIAGNOSIS — C9000 Multiple myeloma not having achieved remission: Secondary | ICD-10-CM | POA: Diagnosis not present

## 2019-01-13 DIAGNOSIS — E041 Nontoxic single thyroid nodule: Secondary | ICD-10-CM | POA: Insufficient documentation

## 2019-01-13 DIAGNOSIS — I700 Atherosclerosis of aorta: Secondary | ICD-10-CM | POA: Diagnosis not present

## 2019-01-13 LAB — CMP (CANCER CENTER ONLY)
ALT: 12 U/L (ref 0–44)
AST: 11 U/L — ABNORMAL LOW (ref 15–41)
Albumin: 2.9 g/dL — ABNORMAL LOW (ref 3.5–5.0)
Alkaline Phosphatase: 47 U/L (ref 38–126)
Anion gap: 11 (ref 5–15)
BUN: 20 mg/dL (ref 8–23)
CO2: 26 mmol/L (ref 22–32)
Calcium: 9.6 mg/dL (ref 8.9–10.3)
Chloride: 102 mmol/L (ref 98–111)
Creatinine: 1.06 mg/dL (ref 0.61–1.24)
GFR, Est AFR Am: >60 mL/min (ref >60)
GFR, Estimated: >60 mL/min (ref >60)
Glucose, Bld: 109 mg/dL — ABNORMAL HIGH (ref 70–99)
Potassium: 3.8 mmol/L (ref 3.5–5.1)
Sodium: 139 mmol/L (ref 135–145)
Total Bilirubin: 0.3 mg/dL (ref 0.3–1.2)
Total Protein: 8.6 g/dL — ABNORMAL HIGH (ref 6.5–8.1)

## 2019-01-13 LAB — CBC WITH DIFFERENTIAL/PLATELET
Abs Immature Granulocytes: 0.01 10*3/uL (ref 0.00–0.07)
Basophils Absolute: 0 10*3/uL (ref 0.0–0.1)
Basophils Relative: 0 %
Eosinophils Absolute: 0 10*3/uL (ref 0.0–0.5)
Eosinophils Relative: 0 %
HCT: 35 % — ABNORMAL LOW (ref 39.0–52.0)
Hemoglobin: 11.9 g/dL — ABNORMAL LOW (ref 13.0–17.0)
Immature Granulocytes: 0 %
Lymphocytes Relative: 25 %
Lymphs Abs: 1.3 10*3/uL (ref 0.7–4.0)
MCH: 33.2 pg (ref 26.0–34.0)
MCHC: 34 g/dL (ref 30.0–36.0)
MCV: 97.8 fL (ref 80.0–100.0)
Monocytes Absolute: 0.2 10*3/uL (ref 0.1–1.0)
Monocytes Relative: 4 %
Neutro Abs: 3.7 10*3/uL (ref 1.7–7.7)
Neutrophils Relative %: 71 %
Platelets: 95 10*3/uL — ABNORMAL LOW (ref 150–400)
RBC: 3.58 MIL/uL — ABNORMAL LOW (ref 4.22–5.81)
RDW: 16.2 % — ABNORMAL HIGH (ref 11.5–15.5)
WBC: 5.3 10*3/uL (ref 4.0–10.5)
nRBC: 0 % (ref 0.0–0.2)

## 2019-01-13 NOTE — Telephone Encounter (Signed)
Scheduled per 10/22 los, patient received after visit summary and calender.

## 2019-01-17 LAB — MULTIPLE MYELOMA PANEL, SERUM
Albumin SerPl Elph-Mcnc: 3.3 g/dL (ref 2.9–4.4)
Albumin/Glob SerPl: 0.7 (ref 0.7–1.7)
Alpha 1: 0.2 g/dL (ref 0.0–0.4)
Alpha2 Glob SerPl Elph-Mcnc: 0.9 g/dL (ref 0.4–1.0)
B-Globulin SerPl Elph-Mcnc: 3.5 g/dL — ABNORMAL HIGH (ref 0.7–1.3)
Gamma Glob SerPl Elph-Mcnc: 0.2 g/dL — ABNORMAL LOW (ref 0.4–1.8)
Globulin, Total: 4.8 g/dL — ABNORMAL HIGH (ref 2.2–3.9)
IgA: 2905 mg/dL — ABNORMAL HIGH (ref 61–437)
IgG (Immunoglobin G), Serum: 330 mg/dL — ABNORMAL LOW (ref 603–1613)
IgM (Immunoglobulin M), Srm: 9 mg/dL — ABNORMAL LOW (ref 15–143)
M Protein SerPl Elph-Mcnc: 2.6 g/dL — ABNORMAL HIGH
Total Protein ELP: 8.1 g/dL (ref 6.0–8.5)

## 2019-01-19 MED FILL — NINLARO 3 MG CAPSULE: 3 | 28 days supply | Qty: 3 | Fill #1

## 2019-01-19 MED FILL — DEXAMETHASONE 4 MG TABLET: 4 | 28 days supply | Qty: 20 | Fill #3

## 2019-01-31 ENCOUNTER — Telehealth: Payer: Self-pay

## 2019-01-31 NOTE — Telephone Encounter (Signed)
Received a fax from [facility] [employer] for Foley catheter supplies. Called wife to verify whether they needed them or not. He is no longer using it so we can void the request.

## 2019-02-10 ENCOUNTER — Other Ambulatory Visit: Payer: Self-pay

## 2019-02-10 ENCOUNTER — Telehealth: Payer: Self-pay | Admitting: Hematology

## 2019-02-10 ENCOUNTER — Inpatient Hospital Stay: Payer: Medicare Other | Attending: Hematology and Oncology

## 2019-02-10 ENCOUNTER — Inpatient Hospital Stay (HOSPITAL_BASED_OUTPATIENT_CLINIC_OR_DEPARTMENT_OTHER): Payer: Medicare Other | Admitting: Hematology

## 2019-02-10 VITALS — BP 153/118 | HR 102 | Temp 98.0°F | Resp 18 | Ht 73.0 in | Wt 173.8 lb

## 2019-02-10 DIAGNOSIS — E041 Nontoxic single thyroid nodule: Secondary | ICD-10-CM | POA: Diagnosis not present

## 2019-02-10 DIAGNOSIS — Z79899 Other long term (current) drug therapy: Secondary | ICD-10-CM | POA: Diagnosis not present

## 2019-02-10 DIAGNOSIS — I700 Atherosclerosis of aorta: Secondary | ICD-10-CM | POA: Insufficient documentation

## 2019-02-10 DIAGNOSIS — C9000 Multiple myeloma not having achieved remission: Secondary | ICD-10-CM

## 2019-02-10 LAB — CMP (CANCER CENTER ONLY)
ALT: 13 U/L (ref 0–44)
AST: 13 U/L — ABNORMAL LOW (ref 15–41)
Albumin: 3 g/dL — ABNORMAL LOW (ref 3.5–5.0)
Alkaline Phosphatase: 46 U/L (ref 38–126)
Anion gap: 15 (ref 5–15)
BUN: 20 mg/dL (ref 8–23)
CO2: 25 mmol/L (ref 22–32)
Calcium: 9.3 mg/dL (ref 8.9–10.3)
Chloride: 101 mmol/L (ref 98–111)
Creatinine: 0.96 mg/dL (ref 0.61–1.24)
GFR, Est AFR Am: >60 mL/min (ref >60)
GFR, Estimated: >60 mL/min (ref >60)
Glucose, Bld: 107 mg/dL — ABNORMAL HIGH (ref 70–99)
Potassium: 3.7 mmol/L (ref 3.5–5.1)
Sodium: 141 mmol/L (ref 135–145)
Total Bilirubin: 0.6 mg/dL (ref 0.3–1.2)
Total Protein: 9 g/dL — ABNORMAL HIGH (ref 6.5–8.1)

## 2019-02-10 LAB — CBC WITH DIFFERENTIAL/PLATELET
Abs Immature Granulocytes: 0.01 10*3/uL (ref 0.00–0.07)
Basophils Absolute: 0 10*3/uL (ref 0.0–0.1)
Basophils Relative: 0 %
Eosinophils Absolute: 0 10*3/uL (ref 0.0–0.5)
Eosinophils Relative: 0 %
HCT: 35.8 % — ABNORMAL LOW (ref 39.0–52.0)
Hemoglobin: 12 g/dL — ABNORMAL LOW (ref 13.0–17.0)
Immature Granulocytes: 0 %
Lymphocytes Relative: 17 %
Lymphs Abs: 1 10*3/uL (ref 0.7–4.0)
MCH: 32.6 pg (ref 26.0–34.0)
MCHC: 33.5 g/dL (ref 30.0–36.0)
MCV: 97.3 fL (ref 80.0–100.0)
Monocytes Absolute: 0.2 10*3/uL (ref 0.1–1.0)
Monocytes Relative: 3 %
Neutro Abs: 4.6 10*3/uL (ref 1.7–7.7)
Neutrophils Relative %: 80 %
Platelets: 92 10*3/uL — ABNORMAL LOW (ref 150–400)
RBC: 3.68 MIL/uL — ABNORMAL LOW (ref 4.22–5.81)
RDW: 16.9 % — ABNORMAL HIGH (ref 11.5–15.5)
WBC: 5.7 10*3/uL (ref 4.0–10.5)
nRBC: 0 % (ref 0.0–0.2)

## 2019-02-10 NOTE — Telephone Encounter (Signed)
Scheduled per 11/19 los, patient received after visit summary and calender.

## 2019-02-10 NOTE — Progress Notes (Signed)
HEMATOLOGY/ONCOLOGY CLINIC NOTE  Date of Service: 02/10/2019  Patient Care Team: Venia Carbon, MD as PCP - Bethena Roys, MD as Consulting Physician (Cardiology) Marchia Bond, MD as Consulting Physician (Orthopedic Surgery)  CHIEF COMPLAINTS/PURPOSE OF CONSULTATION:  Multiple Myeloma  HISTORY OF PRESENTING ILLNESS:   Martin Conner is a wonderful 85 y.o. male who has been referred to [place] by Dr. Nicholas Lose for evaluation and management of his newly diagnosed Multiple Myeloma. The pt reports that he is doing well overall.   The pt reports that he has not felt differently recently as compared to his baseline 6-12 months ago. The pt notes that he had a physical in late January 2020 with his PCP and routine labs which revealed a concern for high protein. This was evaluated further and the pt was referred to my colleague Dr. Lindi Adie who has completed initial work up.  The pt denies problems passing urine, bowel abnormalities, or neuropathy. He also denies back pains.  The pt notes that he walks very little, ambulates with a cane or a walker, but can get around in his home well. He lives with his wife in his own home. The pt denies having falls, and uses a cane/walker to prevent falls. The pt notes that he has two children, and notes that they are not very involved with his medical decision making.  The pt denies heart, liver, lung, thyroid problems. He does have Afib however, and takes Eliquis.  Most recent lab results (06/03/18) of CBC w/diff is as follows: all values are WNL except for WBC at 3.8k, RBC at 3.73, HGB at 12.5, HCT at 37.4, MCV at 100.3, PLT at 145k. 06/03/18 MMP revealed all values WNL except for IgA at 2646, Total Protein at 8.7, B-globulin at 3.6, M Protein at 2.6g, and Globulin Total at 5.2. 06/03/18 Beta-2 microglobulin at 3.1 06/03/18 SFLC revealed Kappa at 1390.2, and K:L ratio at 105.32  On review of systems, pt reports stable energy levels, and denies  urination abnormalities, back pain, changes in bowel habits, abdominal pains, and any other symptoms.   On PMHx the pt reports Afib. On Social Hx the pt reports infrequent single alcoholic beverage. Denies ever smoking cigarettes. 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 On Family Hx the pt denies significant concerns.  Interval History:    Martin Conner returns today for management and evaluation of his Multiple Myeloma. The patient's last visit with [place] was on 01/13/19. The pt reports that he is doing well overall.  Pt is accompanied by his wife [name] via phone call  The pt reports he is doing well.   He doesn't have an appetite sometimes. He eats breakfast but does not want food again until the next morning.  He is restless at night  Lab results today (02/10/19) of CBC w/diff and CMP is as follows: all values are WNL except for RBC at 3.68, Hemoglobin at 12.0, HCT at 35.8, RDW at 16.9, Platelets at 92, Glucose Bld at 107, Total Protein at 9.0, albumin at 3.0,AST at 13.  On review of systems, pt reports some back pain, and denies urinary problems, leg swelling abdominal pain, bone pain and any other symptoms.    MEDICAL HISTORY:  Past Medical History:  Diagnosis Date  . Fracture of femoral neck, right ([place]) 07/10/2015   s/p mechanical fall  . Hyperlipidemia   . Hypertensive heart disease   . Lumbar disc disease   . Osteoarthritis   .  Permanent atrial fibrillation    a. 07/2012 Echo: EF 55-60%, no rwma, mildly dil RA/LA.  Marland Kitchen Syncope    a. 07/2012 - unclear etiology-->resulted in right tib/fib fx req ORIF/intramedullary nail of tibia.    SURGICAL HISTORY: Past Surgical History:  Procedure Laterality Date  . ANKLE SURGERY     right ankle  . TIBIA IM NAIL INSERTION Right 07/21/2012   Procedure: INTRAMEDULLARY (IM) NAIL TIBIAL;  Surgeon: Johnny Bridge, MD;  Location: [place];  Service: Orthopedics;  Laterality: Right;  . TOTAL HIP ARTHROPLASTY     left  . TOTAL HIP  ARTHROPLASTY Right 07/11/2015   Procedure: TOTAL HIP ARTHROPLASTY;  Surgeon: Marchia Bond, MD;  Location: [place];  Service: Orthopedics;  Laterality: Right;    SOCIAL HISTORY: Social History   Socioeconomic History  . Marital status: Married    Spouse name: Not on file  . Number of children: 2  . Years of education: Not on file  . Highest education level: Not on file  Occupational History  . Occupation: retired     Fish farm manager: [employer]  . Financial resource strain: Not on file  . Food insecurity    Worry: Not on file    Inability: Not on file  . Transportation needs    Medical: Not on file    Non-medical: Not on file  Tobacco Use  . Smoking status: Never Smoker  . Smokeless tobacco: Never Used  Substance and Sexual Activity  . Alcohol use: Yes    Alcohol/week: 12.0 standard drinks    Types: 12 Cans of beer per week    Comment: 2 beers several x/wk and occas cocktail on top of it.  . Drug use: No  . Sexual activity: Never  Lifestyle  . Physical activity    Days per week: Not on file    Minutes per session: Not on file  . Stress: Not on file  Relationships  . Social Herbalist on phone: Not on file    Gets together: Not on file    Attends religious service: Not on file    Active member of club or organization: Not on file    Attends meetings of clubs or organizations: Not on file    Relationship status: Not on file  . Intimate partner violence    Fear of current or ex partner: Not on file    Emotionally abused: Not on file    Physically abused: Not on file    Forced sexual activity: Not on file  Other Topics Concern  . Not on file  Social History Narrative   Not sure about living will   Requests that wife make decisions for him if needed   Would accept resuscitation but no prolonged artificial ventilation   Would not want tube feeds if cognitively unaware    FAMILY HISTORY: Family History  Problem Relation Age of Onset  . Cancer  Mother   . Hyperlipidemia Sister   . Hypertension Sister     ALLERGIES:  has No Known Allergies.  MEDICATIONS:  Current Outpatient Medications  Medication Sig Dispense Refill  . Acetaminophen (TYLENOL PO) Take 500 mg by mouth daily as needed (pain). Pt unsure of strength     . dexamethasone (DECADRON) 4 MG tablet Take 5 tablets (20 mg total) by mouth once a week. Take with breakfast. 20 tablet 3  . HYDROcodone-acetaminophen (NORCO/VICODIN) 5-325 MG tablet Take 1-2 tablets by mouth every 6 (six) hours  as needed for moderate pain. 30 tablet 0  . ibuprofen (ADVIL) 200 MG tablet Take 400 mg by mouth every 6 (six) hours as needed for moderate pain.    Marland Kitchen NINLARO 3 MG capsule TAKE 1 CAPSULE (3 MG) BY MOUTH WEEKLY, 3 WEEKS ON, 1 WEEK OFF, REPEAT EVERY 4 WEEKS. TAKE ON AN EMPTY STOMACH 1HR BEFORE OR 2HR AFTER MEALS. 3 capsule 1  . ondansetron (ZOFRAN) 8 MG tablet Take 1 tablet (8 mg total) by mouth 2 (two) times daily as needed (Nausea or vomiting). 30 tablet 1  . polyethylene glycol (MIRALAX / GLYCOLAX) 17 g packet Take 17 g by mouth daily.    . sennosides-docusate sodium (SENOKOT-S) 8.6-50 MG tablet Take 2 tablets by mouth as needed for constipation.    . tamsulosin (FLOMAX) 0.4 MG CAPS capsule Take 1 capsule (0.4 mg total) by mouth daily after supper. 30 capsule 11   No current facility-administered medications for this visit.     REVIEW OF SYSTEMS:    A 10+ POINT REVIEW OF SYSTEMS WAS OBTAINED including neurology, dermatology, psychiatry, cardiac, respiratory, lymph, extremities, GI, GU, Musculoskeletal, constitutional, breasts, reproductive, HEENT.  All pertinent positives are noted in the HPI.  All others are negative.    PHYSICAL EXAMINATION: ECOG FS:3 - Symptomatic, >50% confined to bed  Vitals:   02/10/19 1249  BP: (!) 153/118  Pulse: (!) 102  Resp: 18  Temp: 98 F (36.7 C)  SpO2: 98%   Wt Readings from Last 3 Encounters:  02/10/19 173 lb 12.8 oz (78.8 kg)  01/13/19 169 lb  3.2 oz (76.7 kg)  12/21/18 165 lb (74.8 kg)   Body mass index is 22.93 kg/m.    GENERAL:alert, in no acute distress and comfortable SKIN: no acute rashes, no significant lesions EYES: conjunctiva are pink and non-injected, sclera anicteric OROPHARYNX: MMM, no exudates, no oropharyngeal erythema or ulceration NECK: supple, no JVD LYMPH:  no palpable lymphadenopathy in the cervical, axillary or inguinal regions LUNGS: clear to auscultation b/l with normal respiratory effort HEART: regular rate & rhythm ABDOMEN:  normoactive bowel sounds , non tender, not distended. Extremity: no pedal edema PSYCH: alert & oriented x 3 with fluent speech NEURO: no focal motor/sensory deficits  LABORATORY DATA:  I have reviewed the data as listed  . CBC Latest Ref Rng & Units 02/10/2019 01/13/2019 12/14/2018  WBC 4.0 - 10.5 K/uL 5.7 5.3 8.5  Hemoglobin 13.0 - 17.0 g/dL 12.0(L) 11.9(L) 12.5(L)  Hematocrit 39.0 - 52.0 % 35.8(L) 35.0(L) 36.5(L)  Platelets 150 - 400 K/uL 92(L) 95(L) 98(L)  ANC1.5k  . CMP Latest Ref Rng & Units 02/10/2019 01/13/2019 12/14/2018  Glucose 70 - 99 mg/dL 107(H) 109(H) 124(H)  BUN 8 - 23 mg/dL 20 20 17   Creatinine 0.61 - 1.24 mg/dL 0.96 1.06 0.98  Sodium 135 - 145 mmol/L 141 139 137  Potassium 3.5 - 5.1 mmol/L 3.7 3.8 5.3(H)  Chloride 98 - 111 mmol/L 101 102 100  CO2 22 - 32 mmol/L 25 26 23   Calcium 8.9 - 10.3 mg/dL 9.3 9.6 10.0  Total Protein 6.5 - 8.1 g/dL 9.0(H) 8.6(H) 9.6(H)  Total Bilirubin 0.3 - 1.2 mg/dL 0.6 0.3 0.6  Alkaline Phos 38 - 126 U/L 46 47 49  AST 15 - 41 U/L 13(L) 11(L) 13(L)  ALT 0 - 44 U/L 13 12 11     06/03/18 BM Bx:    06/03/18 Cytogenetics:      RADIOGRAPHIC STUDIES: I have personally reviewed the radiological images as listed and  agreed with the findings in the report. No results found.  ASSESSMENT & PLAN:   85 y.o. male with  1. Multiple Myeloma 06/03/18 BM Biopsy revealed cellular marrow involved by plasma cell neoplasm (about  50%) 06/03/18 Cytogenetics revealed 54.5% of cells showed gain of ATM, and 51.5% of cells with loss of p53 06/03/18 Bone Survey revealed Possible 8 mm lytic lesion versus subarachnoid granulation within the parietal convexity of the calvarium. No other suspicious lytic lesions seen within the skeleton. Labs upon initial presentation from 06/03/18, M Protein at 2.6g with IgA kappa specificity. Some new mild anemia with HGB at 12.5.  06/30/18 M Protein at 3.1g  07/20/18 PET/CT revealed "No hypermetabolic bone disease evident. 2. 12 mm hypermetabolic right thyroid nodule, nonspecific. Thyroid ultrasound could be used to further evaluate as clinically warranted in this individual. No other unexpected hypermetabolic soft tissue disease identified on today's study. 3. Bilateral tiny pulmonary nodules measuring up to about 3 mm maximum size. Consider follow-up to ensure stability. 4.  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 -02/10/2019 Hemoglobin at 12 -02/10/2019 Platelets at 92. Due  -02/10/2019 Blood Chemistries are steady -Discussed weight loss. 8 lbs over 2 months. Recommended trying to maintain healthier eating. Offered medications to increase appetite. Pt and his wife declined.  -MMP -- show Mspike is progressively down to 2.5 - no overt treatment toxicities noted  FOLLOW UP: RTC with Dr Irene Limbo with labs in 1 month    The total time spent in the appt was 25 minutes and more than 50% was on counseling and direct patient cares.  All of the patient's questions were answered with apparent satisfaction. The  patient knows to call the clinic with any problems, questions or concerns.    Sullivan Lone MD MS AAHIVMS [facility] Hematology/Oncology Physician [facility]  (Office):       [phone] (Work cell):  [phone] (Fax):           [phone]  02/10/2019 5:10 AM   I, Scot Dock, am acting as a scribe for Dr. Sullivan Lone.   .I have reviewed the above documentation for accuracy and completeness, and I agree with the above. Brunetta Genera MD

## 2019-02-11 ENCOUNTER — Other Ambulatory Visit: Payer: Self-pay | Admitting: Hematology

## 2019-02-11 DIAGNOSIS — C9000 Multiple myeloma not having achieved remission: Secondary | ICD-10-CM

## 2019-02-14 LAB — MULTIPLE MYELOMA PANEL, SERUM
Albumin SerPl Elph-Mcnc: 3.3 g/dL (ref 2.9–4.4)
Albumin/Glob SerPl: 0.7 (ref 0.7–1.7)
Alpha 1: 0.3 g/dL (ref 0.0–0.4)
Alpha2 Glob SerPl Elph-Mcnc: 1 g/dL (ref 0.4–1.0)
B-Globulin SerPl Elph-Mcnc: 3.4 g/dL — ABNORMAL HIGH (ref 0.7–1.3)
Gamma Glob SerPl Elph-Mcnc: 0.2 g/dL — ABNORMAL LOW (ref 0.4–1.8)
Globulin, Total: 4.9 g/dL — ABNORMAL HIGH (ref 2.2–3.9)
IgA: 2849 mg/dL — ABNORMAL HIGH (ref 61–437)
IgG (Immunoglobin G), Serum: 338 mg/dL — ABNORMAL LOW (ref 603–1613)
IgM (Immunoglobulin M), Srm: 8 mg/dL — ABNORMAL LOW (ref 15–143)
M Protein SerPl Elph-Mcnc: 2.5 g/dL — ABNORMAL HIGH
Total Protein ELP: 8.2 g/dL (ref 6.0–8.5)

## 2019-02-15 MED FILL — NINLARO 3 MG CAPSULE: 3 | 28 days supply | Qty: 3 | Fill #0

## 2019-02-15 MED FILL — DEXAMETHASONE 4 MG TABLET: 4 | 28 days supply | Qty: 20 | Fill #0

## 2019-02-21 ENCOUNTER — Telehealth: Payer: Self-pay | Admitting: *Deleted

## 2019-02-21 NOTE — Telephone Encounter (Signed)
Daughter Ria Comment called - father experiencing increase in back pain over las 7-10 days. Needs assistance to get in/out of bed or chair. Taking 1-2 Norco (prescribed in August by PCP). Encouraged to contact PCP for evaluation. Dr. Irene Limbo informed.

## 2019-02-24 ENCOUNTER — Ambulatory Visit (INDEPENDENT_AMBULATORY_CARE_PROVIDER_SITE_OTHER)
Admission: RE | Admit: 2019-02-24 | Discharge: 2019-02-24 | Disposition: A | Discharge status: Home / Self Care | Payer: Medicare Other | Source: Ambulatory Visit | Attending: Internal Medicine | Admitting: Internal Medicine

## 2019-02-24 ENCOUNTER — Encounter: Payer: Self-pay | Admitting: Internal Medicine

## 2019-02-24 ENCOUNTER — Telehealth: Payer: Self-pay | Admitting: *Deleted

## 2019-02-24 ENCOUNTER — Ambulatory Visit (INDEPENDENT_AMBULATORY_CARE_PROVIDER_SITE_OTHER): Payer: Medicare Other | Admitting: Internal Medicine

## 2019-02-24 ENCOUNTER — Other Ambulatory Visit: Payer: Self-pay

## 2019-02-24 DIAGNOSIS — M545 Low back pain, unspecified: Secondary | ICD-10-CM | POA: Insufficient documentation

## 2019-02-24 DIAGNOSIS — R443 Hallucinations, unspecified: Secondary | ICD-10-CM | POA: Diagnosis not present

## 2019-02-24 DIAGNOSIS — N390 Urinary tract infection, site not specified: Secondary | ICD-10-CM | POA: Diagnosis not present

## 2019-02-24 LAB — POC URINALSYSI DIPSTICK (AUTOMATED)
Bilirubin, UA: NEGATIVE
Glucose, UA: NEGATIVE
Ketones, UA: NEGATIVE
Nitrite, UA: NEGATIVE
Protein, UA: POSITIVE — AB
Spec Grav, UA: 1.025 (ref 1.010–1.025)
Urobilinogen, UA: 1 E.U./dL
pH, UA: 6 (ref 5.0–8.0)

## 2019-02-24 MED ORDER — CIPROFLOXACIN HCL 250 MG PO TABS: 250.0000 mg | ORAL_TABLET | Freq: Two times a day (BID) | ORAL | 1 refills | Status: AC

## 2019-02-24 MED ORDER — HYDROCODONE-ACETAMINOPHEN 5-325 MG PO TABS: 1.0000 | ORAL_TABLET | Freq: Four times a day (QID) | ORAL | 0 refills | Status: DC | PRN

## 2019-02-24 NOTE — Assessment & Plan Note (Signed)
And delusions Unclear if this is related to illness or medication or dementia Not causing distress Will monitor

## 2019-02-24 NOTE — Telephone Encounter (Signed)
Per Dr. Irene Limbo, patient has appt with PCP on 12/3 for evaluation for back pain. Following evaluation from PCP, can contact this office if further follow up is needed.  Contacted daughter Ria Comment with this information. She verbalized understanding.

## 2019-02-24 NOTE — Assessment & Plan Note (Signed)
Has abnormal urinalysis and may have some retention again I don't think his back pain is from this but will treat just in case

## 2019-02-24 NOTE — Progress Notes (Signed)
Subjective:    Patient ID: Martin Conner, male    DOB: 1928/03/05, 85 y.o.   MRN: [id number]  HPI Here with wife and daughter due to increased back pain  This visit occurred during the SARS-CoV-2 public health emergency.  Safety protocols were in place, including screening questions prior to the visit, additional usage of staff PPE, and extensive cleaning of exam room while observing appropriate contact time as indicated for disinfecting solutions.   2 weeks ago---started complaining of low back pain Wife hurt hip trying to help him get out of bed Getting 2 hydrocodone at night--does help  Gets confused at times Having visual hallucinations and some delusions  No fever No dysuria or hematuria He and wife feels he is now emptying okay Nocturia x 2 Now having trouble with mobility --even getting out of bed may need help  Current Outpatient Medications on File Prior to Visit  Medication Sig Dispense Refill  . Acetaminophen (TYLENOL PO) Take 500 mg by mouth daily as needed (pain). Pt unsure of strength     . dexamethasone (DECADRON) 4 MG tablet TAKE 5 TABLETS (20 MG TOTAL) BY MOUTH ONCE A WEEK. TAKE WITH BREAKFAST. 20 tablet 3  . HYDROcodone-acetaminophen (NORCO/VICODIN) 5-325 MG tablet Take 1-2 tablets by mouth every 6 (six) hours as needed for moderate pain. 30 tablet 0  . ibuprofen (ADVIL) 200 MG tablet Take 400 mg by mouth every 6 (six) hours as needed for moderate pain.    Marland Kitchen NINLARO 3 MG capsule TAKE 1 CAPSULE (3 MG) BY MOUTH WEEKLY, 3 WEEKS ON, 1 WEEK OFF, REPEAT EVERY 4 WEEKS. TAKE ON AN EMPTY STOMACH 1HR BEFORE OR 2HR AFTER MEALS. 3 capsule 1  . ondansetron (ZOFRAN) 8 MG tablet Take 1 tablet (8 mg total) by mouth 2 (two) times daily as needed (Nausea or vomiting). 30 tablet 1  . polyethylene glycol (MIRALAX / GLYCOLAX) 17 g packet Take 17 g by mouth daily.    . sennosides-docusate sodium (SENOKOT-S) 8.6-50 MG tablet Take 2 tablets by mouth as needed for constipation.    .  tamsulosin (FLOMAX) 0.4 MG CAPS capsule Take 1 capsule (0.4 mg total) by mouth daily after supper. 30 capsule 11   No current facility-administered medications on file prior to visit.     No Known Allergies  Past Medical History:  Diagnosis Date  . Fracture of femoral neck, right ([place]) 07/10/2015   s/p mechanical fall  . Hyperlipidemia   . Hypertensive heart disease   . Lumbar disc disease   . Osteoarthritis   . Permanent atrial fibrillation ([place])    a. 07/2012 Echo: EF 55-60%, no rwma, mildly dil RA/LA.  Marland Kitchen Syncope    a. 07/2012 - unclear etiology-->resulted in right tib/fib fx req ORIF/intramedullary nail of tibia.    Past Surgical History:  Procedure Laterality Date  . ANKLE SURGERY     right ankle  . TIBIA IM NAIL INSERTION Right 07/21/2012   Procedure: INTRAMEDULLARY (IM) NAIL TIBIAL;  Surgeon: Johnny Bridge, MD;  Location: [place];  Service: Orthopedics;  Laterality: Right;  . TOTAL HIP ARTHROPLASTY     left  . TOTAL HIP ARTHROPLASTY Right 07/11/2015   Procedure: TOTAL HIP ARTHROPLASTY;  Surgeon: Marchia Bond, MD;  Location: [place];  Service: Orthopedics;  Laterality: Right;    Family History  Problem Relation Age of Onset  . Cancer Mother   . Hyperlipidemia Sister   . Hypertension Sister     Social History   Socioeconomic  History  . Marital status: Married    Spouse name: Not on file  . Number of children: 2  . Years of education: Not on file  . Highest education level: Not on file  Occupational History  . Occupation: retired     Fish farm manager: AT [employer]  . Financial resource strain: Not on file  . Food insecurity    Worry: Not on file    Inability: Not on file  . Transportation needs    Medical: Not on file    Non-medical: Not on file  Tobacco Use  . Smoking status: Never Smoker  . Smokeless tobacco: Never Used  Substance and Sexual Activity  . Alcohol use: Yes    Alcohol/week: 12.0 standard drinks    Types: 12 Cans of beer per week     Comment: 2 beers several x/wk and occas cocktail on top of it.  . Drug use: No  . Sexual activity: Never  Lifestyle  . Physical activity    Days per week: Not on file    Minutes per session: Not on file  . Stress: Not on file  Relationships  . Social Herbalist on phone: Not on file    Gets together: Not on file    Attends religious service: Not on file    Active member of club or organization: Not on file    Attends meetings of clubs or organizations: Not on file    Relationship status: Not on file  . Intimate partner violence    Fear of current or ex partner: Not on file    Emotionally abused: Not on file    Physically abused: Not on file    Forced sexual activity: Not on file  Other Topics Concern  . Not on file  Social History Narrative   Not sure about living will   Requests that wife make decisions for him if needed   Would accept resuscitation but no prolonged artificial ventilation   Would not want tube feeds if cognitively unaware   Review of Systems Bowels are moving okay No cough or SOB    Objective:   Physical Exam  Constitutional: No distress.  GI: Soft. He exhibits no distension. There is no abdominal tenderness. There is no rebound and no guarding.  Musculoskeletal:     Comments: Localized spine tenderness ~L4-5 No CVA tenderness No sig tenderness laterally in lumbar region  Neurological:  Alert Fairly normal interaction            Assessment & Plan:

## 2019-02-24 NOTE — Addendum Note (Signed)
Addended by: Pilar Grammes on: 02/24/2019 04:45 PM   Modules accepted: Orders

## 2019-02-24 NOTE — Assessment & Plan Note (Addendum)
With his known myeloma---I am most concerned about a bony lesion Will check x-rays Urinalysis is markedly abnormal-- #3+ leuks and blood No retention obvious and no symptoms--but will need empiric Rx in case UTI Hip exam shows fair rotation without pain--so doubt hip related  X-ray negative for myeloma and nothing worrisome Will renew the hydrocodone Try salon pas

## 2019-02-26 LAB — URINE CULTURE
MICRO NUMBER:: 1160327
SPECIMEN QUALITY:: ADEQUATE

## 2019-03-01 ENCOUNTER — Other Ambulatory Visit: Payer: Self-pay

## 2019-03-01 MED ORDER — NITROFURANTOIN MONOHYD MACRO 100 MG PO CAPS: 100.0000 mg | ORAL_CAPSULE | Freq: Two times a day (BID) | ORAL | 0 refills | Status: AC

## 2019-03-01 NOTE — Telephone Encounter (Signed)
The urine grew out a different type of bacteria---and if he truly has a urinary infection, the antibiotic may need to be changed.  If he is still having symptoms, send macrobid 100 bid (#14 x 0)

## 2019-03-07 ENCOUNTER — Telehealth: Payer: Self-pay | Admitting: *Deleted

## 2019-03-07 ENCOUNTER — Telehealth: Payer: Self-pay

## 2019-03-07 MED ORDER — HYDROCODONE-ACETAMINOPHEN 5-325 MG PO TABS: 1.0000 | ORAL_TABLET | Freq: Four times a day (QID) | ORAL | 0 refills | Status: AC | PRN

## 2019-03-07 NOTE — Telephone Encounter (Signed)
Spoke to her Did take the macrodantin and not much urinary symptoms Eats okay for breakfast --then not as much after that  Still with back pain around to abdomen Advil helps some--then they use the hydrocodone mostly in the evening Does tend to get more confused/loud in the evening (his "sundowning")  Will send more hydrocodone--so they can use it in the day Has oncology appt next week

## 2019-03-07 NOTE — Telephone Encounter (Signed)
Try the hydrocodone regularly tid to see if that gives him some ongoing relief (complains most of the day)

## 2019-03-07 NOTE — Telephone Encounter (Signed)
Martin Conner pts daughter (DPR signed) left v/m that pt last saw Dr Silvio Pate on 02/24/19. Martin Conner was speaking with oncology; pt does not have appt to see oncology until 03/16/19. Pt continues to have back and abd pain; pt has 12 of the oxy left (per med list think it is hydrocodone apap 5-325mg .)Martin Conner said that oncology advised her to call Dr Silvio Pate to see if he wants to order a PET scan or other testing and possibly give a different pain med. Martin Conner request cb.

## 2019-03-07 NOTE — Telephone Encounter (Signed)
Daughter [name] called. Patient saw Dr.Letvak on 12/3 for back pain. Had Xray. Dr.Letvak saw no spread of his cancer. Patient given pain medication. It's not helping much - patient is in excruciating pain. She wants to know if Dr. Irene Limbo would want the patient to have another test such as a PET scan. Advised her to follow up with Dr.Letvak's office regarding pain. Dr. Irene Limbo informed.

## 2019-03-08 ENCOUNTER — Telehealth: Payer: Self-pay

## 2019-03-08 NOTE — Telephone Encounter (Signed)
Stacy with [facility] left v/m that Palliative Care had received a referral from [facility] and wanted to make sure Dr Silvio Pate thought appropriate and was OK with pt being followed by Palliative Care. Stacy request cb after reviewed by Dr Silvio Pate.

## 2019-03-08 NOTE — Telephone Encounter (Signed)
Per Dr. Irene Limbo - contacted daughter to advise that he reviewed most recent xray/results and no indication to order additional imaging scans at this time. Per Dr. Irene Limbo-- If PCP has concerns related to condition being treated by Dr. Irene Limbo - he is encouraged to contact Dr.Kale.  Daughter Ria Comment) states she talked with Dr.Letvak last evening (see notes in Epic) and that he added a medication and gave additional pain med with instructions. Patient is scheduled to come for appt next week w/Dr.Kale. One of his daughters will accompany him. [name] verbalized understanding of all information provided.

## 2019-03-09 NOTE — Telephone Encounter (Signed)
Yes---palliative care is appropriate and I will follow

## 2019-03-09 NOTE — Telephone Encounter (Signed)
Verbal authorization given to [facility].

## 2019-03-10 ENCOUNTER — Telehealth: Payer: Self-pay | Admitting: Licensed Clinical Social Worker

## 2019-03-10 NOTE — Telephone Encounter (Signed)
Palliative Care SW left a vm on cell phone to schedule a visit.  SW then phoned patient's daughter, Ria Comment, and scheduled a home visit for Monday, 12/21, at 3pm.  Due to [name]'s work schedule, this was the earliest time available.

## 2019-03-14 ENCOUNTER — Other Ambulatory Visit: Payer: Medicare Other | Admitting: Licensed Clinical Social Worker

## 2019-03-14 DIAGNOSIS — Z515 Encounter for palliative care: Secondary | ICD-10-CM

## 2019-03-14 NOTE — Progress Notes (Addendum)
COMMUNITY PALLIATIVE CARE SW NOTE  PATIENT NAME: Martin Conner DOB: 01/07/1928 MRN: [id number]  PRIMARY CARE PROVIDER: Letvak, Richard I, MD  RESPONSIBLE PARTY:  Acct ID - Guarantor Home Phone Work Phone Relationship Acct Type  [id number] - Dicenzo,Cobie D [phone]  Self P/F     [address], [place], [place] [address]     PLAN OF CARE and INTERVENTIONS:             1. GOALS OF CARE/ ADVANCE CARE PLANNING:  Goal is for patient to remain at home with his wife, [name].  Chart indicates DNR. 2. SOCIAL/EMOTIONAL/SPIRITUAL ASSESSMENT/ INTERVENTIONS:  SW met with patient, his wife, and their daughter, [name].  Patient was observed following simple commands, and was able to go to the bathroom by himself using his walker.  [name] and [name] requested assistance with obtaining VA CNA services.  Currently, the [facility] is only taking walk-ins.  [name] does not have access to a computer, other than internet on her phone.  She does not have a copier either.  SW to return to the home tomorrow with a paper copy of the VA Health Care application and will assist [name].  [name] lives an hour away and will not be present.  [name] stated patient wakes up frequently during the night due to pain.  Patient has an MD appointment on Wednesday and will address.   3. PATIENT/CAREGIVER EDUCATION/ COPING:  Provided education regarding Palliative Care and Hospice.  [name] and [name] cope by problem-solving. 4. PERSONAL EMERGENCY PLAN:  Family will contact EMS. 5. COMMUNITY RESOURCES COORDINATION/ HEALTH CARE NAVIGATION:  None. 6. FINANCIAL/LEGAL CONCERNS/INTERVENTIONS:  Daughter expressed future financial concerns.     SOCIAL HX:  Social History   Tobacco Use  . Smoking status: Never Smoker  . Smokeless tobacco: Never Used  Substance Use Topics  . Alcohol use: Yes    Alcohol/week: 12.0 standard drinks    Types: 12 Cans of beer per week    Comment: 2 beers several x/wk and occas cocktail on top of it.    CODE  STATUS:  DNR  ADVANCED DIRECTIVES: N MOST FORM COMPLETE:  N HOSPICE EDUCATION PROVIDED: Y PPS:  Patient's appetite is normal.  He uses a walker to ambulate. Duration of visit and documentation:  90 Minutes.       Z , LCSW 

## 2019-03-15 ENCOUNTER — Other Ambulatory Visit: Payer: Medicare Other | Admitting: Licensed Clinical Social Worker

## 2019-03-15 ENCOUNTER — Other Ambulatory Visit: Payer: Self-pay

## 2019-03-15 DIAGNOSIS — Z515 Encounter for palliative care: Secondary | ICD-10-CM

## 2019-03-16 ENCOUNTER — Inpatient Hospital Stay (HOSPITAL_BASED_OUTPATIENT_CLINIC_OR_DEPARTMENT_OTHER): Payer: Medicare Other | Admitting: Hematology

## 2019-03-16 ENCOUNTER — Telehealth: Payer: Self-pay

## 2019-03-16 ENCOUNTER — Inpatient Hospital Stay: Payer: Medicare Other | Attending: Hematology and Oncology

## 2019-03-16 ENCOUNTER — Other Ambulatory Visit: Payer: Self-pay

## 2019-03-16 ENCOUNTER — Telehealth: Payer: Self-pay | Admitting: *Deleted

## 2019-03-16 VITALS — BP 124/84 | HR 103 | Temp 98.2°F | Resp 18 | Ht 73.0 in | Wt 177.8 lb

## 2019-03-16 DIAGNOSIS — G893 Neoplasm related pain (acute) (chronic): Secondary | ICD-10-CM

## 2019-03-16 DIAGNOSIS — R627 Adult failure to thrive: Secondary | ICD-10-CM | POA: Diagnosis not present

## 2019-03-16 DIAGNOSIS — C9000 Multiple myeloma not having achieved remission: Secondary | ICD-10-CM

## 2019-03-16 LAB — CMP (CANCER CENTER ONLY)
ALT: 17 U/L (ref 0–44)
AST: 20 U/L (ref 15–41)
Albumin: 2.7 g/dL — ABNORMAL LOW (ref 3.5–5.0)
Alkaline Phosphatase: 63 U/L (ref 38–126)
Anion gap: 15 (ref 5–15)
BUN: 26 mg/dL — ABNORMAL HIGH (ref 8–23)
CO2: 20 mmol/L — ABNORMAL LOW (ref 22–32)
Calcium: 9.6 mg/dL (ref 8.9–10.3)
Chloride: 106 mmol/L (ref 98–111)
Creatinine: 1.3 mg/dL — ABNORMAL HIGH (ref 0.61–1.24)
GFR, Est AFR Am: 56 mL/min — ABNORMAL LOW (ref >60)
GFR, Estimated: 48 mL/min — ABNORMAL LOW (ref >60)
Glucose, Bld: 92 mg/dL (ref 70–99)
Potassium: 4.2 mmol/L (ref 3.5–5.1)
Sodium: 141 mmol/L (ref 135–145)
Total Bilirubin: 0.4 mg/dL (ref 0.3–1.2)
Total Protein: 8.4 g/dL — ABNORMAL HIGH (ref 6.5–8.1)

## 2019-03-16 LAB — CBC WITH DIFFERENTIAL/PLATELET
Abs Immature Granulocytes: 0.01 10*3/uL (ref 0.00–0.07)
Basophils Absolute: 0 10*3/uL (ref 0.0–0.1)
Basophils Relative: 0 %
Eosinophils Absolute: 0 10*3/uL (ref 0.0–0.5)
Eosinophils Relative: 1 %
HCT: 30.2 % — ABNORMAL LOW (ref 39.0–52.0)
Hemoglobin: 9.9 g/dL — ABNORMAL LOW (ref 13.0–17.0)
Immature Granulocytes: 0 %
Lymphocytes Relative: 20 %
Lymphs Abs: 0.8 10*3/uL (ref 0.7–4.0)
MCH: 32.6 pg (ref 26.0–34.0)
MCHC: 32.8 g/dL (ref 30.0–36.0)
MCV: 99.3 fL (ref 80.0–100.0)
Monocytes Absolute: 0.2 10*3/uL (ref 0.1–1.0)
Monocytes Relative: 4 %
Neutro Abs: 3.2 10*3/uL (ref 1.7–7.7)
Neutrophils Relative %: 75 %
Platelets: 91 10*3/uL — ABNORMAL LOW (ref 150–400)
RBC: 3.04 MIL/uL — ABNORMAL LOW (ref 4.22–5.81)
RDW: 17.5 % — ABNORMAL HIGH (ref 11.5–15.5)
WBC: 4.2 10*3/uL (ref 4.0–10.5)
nRBC: 0 % (ref 0.0–0.2)

## 2019-03-16 MED ORDER — LIDOCAINE 5 % EX PTCH: 1.0000 | MEDICATED_PATCH | CUTANEOUS | 0 refills | Status: AC

## 2019-03-16 MED ORDER — LORAZEPAM 2 MG/ML PO CONC: 1.0000 mg | Freq: Four times a day (QID) | ORAL | 0 refills | Status: AC | PRN

## 2019-03-16 MED ORDER — OXYCODONE HCL 20 MG/ML PO CONC: 6.0000 mg | ORAL | 0 refills | Status: AC | PRN

## 2019-03-16 MED FILL — DEXAMETHASONE 4 MG TABLET: 4 | 28 days supply | Qty: 20 | Fill #1

## 2019-03-16 MED FILL — NINLARO 3 MG CAPSULE: 3 | 28 days supply | Qty: 3 | Fill #1

## 2019-03-16 NOTE — Telephone Encounter (Signed)
Debra with [facility] left v/m that Martin Newcomer RN with oncology office said that pts wife and daughter request urgent hospice referral. Debra request verbal orders for hospice referral,certification of terminal illness and Dr Silvio Pate will be hospice attending.

## 2019-03-16 NOTE — Telephone Encounter (Signed)
Contacted [facility] to make Hospice Referral ordered by Dr. Irene Limbo. Patient's demographic info/contact info and daughter [name]'s contact information given to Guilford Shi, with [employer] office [phone].  Contacted [name] to inform her that Hospice referral made by Dr. Irene Limbo during today's appt and that her contact info was given to Hospice along with patient's home number. [name] verbalized understanding.

## 2019-03-16 NOTE — Progress Notes (Signed)
COMMUNITY PALLIATIVE CARE SW NOTE  PATIENT NAME: Martin Conner DOB: 1927-05-25 MRN: [id number]  PRIMARY CARE PROVIDER: Venia Carbon, MD  RESPONSIBLE PARTY:  Acct ID - Guarantor Home Phone Work Phone Relationship Acct Type  [id number] HEMAN, QUE [phone]  Self P/F     [place], [place], [place] [address]     PLAN OF CARE and INTERVENTIONS:             1. GOALS OF CARE/ ADVANCE CARE PLANNING:  Wife's goal is for patient to remain at home.  He is a DNR. 2. SOCIAL/EMOTIONAL/SPIRITUAL ASSESSMENT/ INTERVENTIONS:  SW met with patient's wife, [name], while patient was in another room.  He greeted SW appropriately.  SW assisted [name] complete patient's VA application for health care.  Instructed her to take the application with her to the [place] when she takes patient and she stated she understood.  SW provided active listening and supportive counseling while she engaged in life review. 3. PATIENT/CAREGIVER EDUCATION/ COPING:  Wife copes by problem-solving. 4. PERSONAL EMERGENCY PLAN:  Family will contact EMS. 5. COMMUNITY RESOURCES COORDINATION/ HEALTH CARE NAVIGATION:  None 6. FINANCIAL/LEGAL CONCERNS/INTERVENTIONS:  Wife expressed concern for future financial issues.     SOCIAL HX:  Social History   Tobacco Use  . Smoking status: Never Smoker  . Smokeless tobacco: Never Used  Substance Use Topics  . Alcohol use: Yes    Alcohol/week: 12.0 standard drinks    Types: 12 Cans of beer per week    Comment: 2 beers several x/wk and occas cocktail on top of it.    CODE STATUS:  DNR  ADVANCED DIRECTIVES: N MOST FORM COMPLETE:  N HOSPICE EDUCATION PROVIDED: N PPS:  Appetite is normal.  Patient uses a walker. Duration of visit and documentation:  60 minutes.      Creola Corn Jaron Czarnecki, LCSW

## 2019-03-16 NOTE — Progress Notes (Signed)
HEMATOLOGY/ONCOLOGY CLINIC NOTE  Date of Service: 03/16/2019  Patient Care Team: Venia Carbon, MD as PCP - Bethena Roys, MD as Consulting Physician (Cardiology) Marchia Bond, MD as Consulting Physician (Orthopedic Surgery)  CHIEF COMPLAINTS/PURPOSE OF CONSULTATION:  Multiple Myeloma  HISTORY OF PRESENTING ILLNESS:   Martin Conner is a wonderful 85 y.o. male who has been referred to [place] by Dr. Nicholas Lose for evaluation and management of his newly diagnosed Multiple Myeloma. The pt reports that he is doing well overall.   The pt reports that he has not felt differently recently as compared to his baseline 6-12 months ago. The pt notes that he had a physical in late January 2020 with his PCP and routine labs which revealed a concern for high protein. This was evaluated further and the pt was referred to my colleague Dr. Lindi Adie who has completed initial work up.  The pt denies problems passing urine, bowel abnormalities, or neuropathy. He also denies back pains.  The pt notes that he walks very little, ambulates with a cane or a walker, but can get around in his home well. He lives with his wife in his own home. The pt denies having falls, and uses a cane/walker to prevent falls. The pt notes that he has two children, and notes that they are not very involved with his medical decision making.  The pt denies heart, liver, lung, thyroid problems. He does have Afib however, and takes Eliquis.  Most recent lab results (06/03/18) of CBC w/diff is as follows: all values are WNL except for WBC at 3.8k, RBC at 3.73, HGB at 12.5, HCT at 37.4, MCV at 100.3, PLT at 145k. 06/03/18 MMP revealed all values WNL except for IgA at 2646, Total Protein at 8.7, B-globulin at 3.6, M Protein at 2.6g, and Globulin Total at 5.2. 06/03/18 Beta-2 microglobulin at 3.1 06/03/18 SFLC revealed Kappa at 1390.2, and K:L ratio at 105.32  On review of systems, pt reports stable energy levels, and denies  urination abnormalities, back pain, changes in bowel habits, abdominal pains, and any other symptoms.   On PMHx the pt reports Afib. On Social Hx the pt reports infrequent single alcoholic beverage. Denies ever smoking cigarettes. 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 On Family Hx the pt denies significant concerns.  Interval History:    Martin Conner returns today for management and evaluation of his Multiple Myeloma. We are joined today by his daughter. The patient's last visit with [place] was on 02/10/2019. The pt reports that he is doing well overall.  The pt reports that he is experiencing severe increasing back pain.  His daughter states that his quality of life has decreased. He does not sleep through the night. He is waking up crying out in pain every 2-3 hours.   His Alzheimer's prevents him from remembering his level of pain.  The family has discussed his current conditions and would like to start hospice. The family would prefer him to be admitted into a hospice facility.    Lab results today (03/16/19) of CBC w/diff and CMP is as follows: all values are WNL except for RBC at 3.04, Hemoglobin at 9.9, hematocrit at 30.2, RDW at 17.5, Platelets at 91, CO2 at 20, BUN at 26, Creatinine at 1.30, Total Protein at 8.4, Albumin at 2.7, GFR Est Non Af Am at 48, GFR Est AFR Am at 56.  On review of systems, pt reports increased back pain denies any  other symptoms.    MEDICAL HISTORY:  Past Medical History:  Diagnosis Date  . Fracture of femoral neck, right ([place]) 07/10/2015   s/p mechanical fall  . Hyperlipidemia   . Hypertensive heart disease   . Lumbar disc disease   . Osteoarthritis   . Permanent atrial fibrillation ([place])    a. 07/2012 Echo: EF 55-60%, no rwma, mildly dil RA/LA.  Marland Kitchen Syncope    a. 07/2012 - unclear etiology-->resulted in right tib/fib fx req ORIF/intramedullary nail of tibia.    SURGICAL HISTORY: Past Surgical History:  Procedure Laterality Date  .  ANKLE SURGERY     right ankle  . TIBIA IM NAIL INSERTION Right 07/21/2012   Procedure: INTRAMEDULLARY (IM) NAIL TIBIAL;  Surgeon: Johnny Bridge, MD;  Location: [facility];  Service: Orthopedics;  Laterality: Right;  . TOTAL HIP ARTHROPLASTY     left  . TOTAL HIP ARTHROPLASTY Right 07/11/2015   Procedure: TOTAL HIP ARTHROPLASTY;  Surgeon: Marchia Bond, MD;  Location: [facility];  Service: Orthopedics;  Laterality: Right;    SOCIAL HISTORY: Social History   Socioeconomic History  . Marital status: Married    Spouse name: Not on file  . Number of children: 2  . Years of education: Not on file  . Highest education level: Not on file  Occupational History  . Occupation: retired     Fish farm manager: [employer]  Tobacco Use  . Smoking status: Never Smoker  . Smokeless tobacco: Never Used  Substance and Sexual Activity  . Alcohol use: Yes    Alcohol/week: 12.0 standard drinks    Types: 12 Cans of beer per week    Comment: 2 beers several x/wk and occas cocktail on top of it.  . Drug use: No  . Sexual activity: Never  Other Topics Concern  . Not on file  Social History Narrative   Not sure about living will   Requests that wife make decisions for him if needed   Would accept resuscitation but no prolonged artificial ventilation   Would not want tube feeds if cognitively unaware   Social Determinants of Health   Financial Resource Strain:   . Difficulty of Paying Living Expenses: Not on file  Food Insecurity:   . Worried About Charity fundraiser in the Last Year: Not on file  . Ran Out of Food in the Last Year: Not on file  Transportation Needs:   . Lack of Transportation (Medical): Not on file  . Lack of Transportation (Non-Medical): Not on file  Physical Activity:   . Days of Exercise per Week: Not on file  . Minutes of Exercise per Session: Not on file  Stress:   . Feeling of Stress : Not on file  Social Connections:   . Frequency of Communication with Friends and Family: Not on file    . Frequency of Social Gatherings with Friends and Family: Not on file  . Attends Religious Services: Not on file  . Active Member of Clubs or Organizations: Not on file  . Attends Archivist Meetings: Not on file  . Marital Status: Not on file  Intimate Partner Violence:   . Fear of Current or Ex-Partner: Not on file  . Emotionally Abused: Not on file  . Physically Abused: Not on file  . Sexually Abused: Not on file    FAMILY HISTORY: Family History  Problem Relation Age of Onset  . Cancer Mother   . Hyperlipidemia Sister   . Hypertension Sister  ALLERGIES:  has No Known Allergies.  MEDICATIONS:  Current Outpatient Medications  Medication Sig Dispense Refill  . Acetaminophen (TYLENOL PO) Take 500 mg by mouth daily as needed (pain). Pt unsure of strength     . ciprofloxacin (CIPRO) 250 MG tablet Take 1 tablet (250 mg total) by mouth 2 (two) times daily. 10 tablet 1  . dexamethasone (DECADRON) 4 MG tablet TAKE 5 TABLETS (20 MG TOTAL) BY MOUTH ONCE A WEEK. TAKE WITH BREAKFAST. 20 tablet 3  . HYDROcodone-acetaminophen (NORCO/VICODIN) 5-325 MG tablet Take 1-2 tablets by mouth every 6 (six) hours as needed for moderate pain. 30 tablet 0  . ibuprofen (ADVIL) 200 MG tablet Take 400 mg by mouth every 6 (six) hours as needed for moderate pain.    Marland Kitchen NINLARO 3 MG capsule TAKE 1 CAPSULE (3 MG) BY MOUTH WEEKLY, 3 WEEKS ON, 1 WEEK OFF, REPEAT EVERY 4 WEEKS. TAKE ON AN EMPTY STOMACH 1HR BEFORE OR 2HR AFTER MEALS. 3 capsule 1  . nitrofurantoin, macrocrystal-monohydrate, (MACROBID) 100 MG capsule Take 1 capsule (100 mg total) by mouth 2 (two) times daily. 14 capsule 0  . ondansetron (ZOFRAN) 8 MG tablet Take 1 tablet (8 mg total) by mouth 2 (two) times daily as needed (Nausea or vomiting). 30 tablet 1  . polyethylene glycol (MIRALAX / GLYCOLAX) 17 g packet Take 17 g by mouth daily.    . sennosides-docusate sodium (SENOKOT-S) 8.6-50 MG tablet Take 2 tablets by mouth as needed for  constipation.    . tamsulosin (FLOMAX) 0.4 MG CAPS capsule Take 1 capsule (0.4 mg total) by mouth daily after supper. 30 capsule 11   No current facility-administered medications for this visit.    REVIEW OF SYSTEMS:    A 10+ POINT REVIEW OF SYSTEMS WAS OBTAINED including neurology, dermatology, psychiatry, cardiac, respiratory, lymph, extremities, GI, GU, Musculoskeletal, constitutional, breasts, reproductive, HEENT.  All pertinent positives are noted in the HPI.  All others are negative.  Marland Kitchen    PHYSICAL EXAMINATION: ECOG FS:3 - Symptomatic, >50% confined to bed  Vitals:   03/16/19 0934  BP: 124/84  Pulse: (!) 103  Resp: 18  Temp: 98.2 F (36.8 C)  SpO2: 96%   Wt Readings from Last 3 Encounters:  03/16/19 177 lb 12.8 oz (80.6 kg)  02/24/19 169 lb (76.7 kg)  02/10/19 173 lb 12.8 oz (78.8 kg)   Body mass index is 23.46 kg/m.    GENERAL:alert, in no acute distress and comfortable SKIN: no acute rashes, no significant lesions EYES: conjunctiva are pink and non-injected, sclera anicteric OROPHARYNX: MMM, no exudates, no oropharyngeal erythema or ulceration NECK: supple, no JVD LYMPH:  no palpable lymphadenopathy in the cervical, axillary or inguinal regions LUNGS: clear to auscultation b/l with normal respiratory effort HEART: regular rate & rhythm ABDOMEN:  normoactive bowel sounds , non tender, not distended. Extremity: no pedal edema PSYCH: alert & oriented x 3 with fluent speech NEURO: no focal motor/sensory deficits       LABORATORY DATA:  I have reviewed the data as listed  . CBC Latest Ref Rng & Units 03/16/2019 02/10/2019 01/13/2019  WBC 4.0 - 10.5 K/uL 4.2 5.7 5.3  Hemoglobin 13.0 - 17.0 g/dL 9.9(L) 12.0(L) 11.9(L)  Hematocrit 39.0 - 52.0 % 30.2(L) 35.8(L) 35.0(L)  Platelets 150 - 400 K/uL 91(L) 92(L) 95(L)  ANC1.5k  . CMP Latest Ref Rng & Units 03/16/2019 02/10/2019 01/13/2019  Glucose 70 - 99 mg/dL 92 107(H) 109(H)  BUN 8 - 23 mg/dL 26(H) 20 20  Creatinine 0.61 - 1.24 mg/dL 1.30(H) 0.96 1.06  Sodium 135 - 145 mmol/L 141 141 139  Potassium 3.5 - 5.1 mmol/L 4.2 3.7 3.8  Chloride 98 - 111 mmol/L 106 101 102  CO2 22 - 32 mmol/L 20(L) 25 26  Calcium 8.9 - 10.3 mg/dL 9.6 9.3 9.6  Total Protein 6.5 - 8.1 g/dL 8.4(H) 9.0(H) 8.6(H)  Total Bilirubin 0.3 - 1.2 mg/dL 0.4 0.6 0.3  Alkaline Phos 38 - 126 U/L 63 46 47  AST 15 - 41 U/L 20 13(L) 11(L)  ALT 0 - 44 U/L _0 06/03/18 BM Bx:    06/03/18 Cytogenetics:      RADIOGRAPHIC STUDIES: I have personally reviewed the radiological images as listed and agreed with the findings in the report. DG Lumbar Spine Complete  Result Date: 02/24/2019 CLINICAL DATA:  Recent spine pain in myeloma patient. EXAM: LUMBAR SPINE - COMPLETE 4+ VIEW COMPARISON:  Reformats from abdominopelvic CT 12/22/2018. FINDINGS: The bones are diffusely under mineralized. Sclerotic density projecting over L2 vertebra on the lateral view is likely right renal calculus is seen on prior CT. Vertebral body heights are preserved, no acute compression fracture. No obvious lytic lesion, allowing for underlying bony under mineralization. Similar endplate spurring at multiple levels with preservation of disc spaces. There is facet hypertrophy in the lower lumbar spine. Sacroiliac joints are congruent. Atherosclerosis of the abdominal aorta. IMPRESSION: 1. Diffuse osteopenia/osteoporosis and multilevel degenerative change in the lumbar spine. No compression fracture or evidence of acute abnormality. 2. Right renal calculus, that projects over the L2 vertebra on the lateral view, unchanged from July 2020 abdominal CT. Electronically Signed   By: Keith Rake M.D.   On: 02/24/2019 15:28    ASSESSMENT & PLAN:   85 y.o. male with  1. Multiple Myeloma 06/03/18 BM Biopsy revealed cellular marrow involved by plasma cell neoplasm (about 50%) 06/03/18 Cytogenetics revealed 54.5% of cells showed gain of ATM, and 51.5% of cells  with loss of p53 06/03/18 Bone Survey revealed Possible 8 mm lytic lesion versus subarachnoid granulation within the parietal convexity of the calvarium. No other suspicious lytic lesions seen within the skeleton. Labs upon initial presentation from 06/03/18, M Protein at 2.6g with IgA kappa specificity. Some new mild anemia with HGB at 12.5.  06/30/18 M Protein at 3.1g  07/20/18 PET/CT revealed "No hypermetabolic bone disease evident. 2. 12 mm hypermetabolic right thyroid nodule, nonspecific. Thyroid ultrasound could be used to further evaluate as clinically warranted in this individual. No other unexpected hypermetabolic soft tissue disease identified on today's study. 3. Bilateral tiny pulmonary nodules measuring up to about 3 mm maximum size. Consider follow-up to ensure stability. 4.  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 -Discussed "1. Diffuse osteopenia/osteoporosis and multilevel degenerative change in the lumbar spine. No compression fracture or evidence of acute abnormality. 2. Right renal calculus, that projects over the L2 vertebra on the lateral view, unchanged from July 2020 abdominal CT." -Advised that his condition is not curable and that his current treatment is  for management. Discussed that from a myeloma stand point, he is doing okay.  -Discussed that increasing pain medications can decrease his lucidity.  -Discussed the option to refer him to hospice -03/16/19 albumin at 2.7 -03/16/19 BUN at 26 -Will prescribe lidocaine patches for incidental pain and higher  dosage Oxycodone. -Will stop non essention medications when hospice is started.    FOLLOW UP: Referral to hospice.based on worsening uncontrolled symptoms and incurable myeloma.  The total time spent in the appt was 25 minutes and more than 50% was on counseling and direct patient cares.  All of the patient's questions were answered with apparent satisfaction. The patient knows to call the clinic with any problems, questions or concerns.     Sullivan Lone MD MS AAHIVMS [facility] Hematology/Oncology Physician [facility]  (Office):       [phone] (Work cell):  [phone] (Fax):           [phone]  03/16/2019 5:48 AM   I, Scot Dock, am acting as a scribe for Dr. Sullivan Lone.   .I have reviewed the above documentation for accuracy and completeness, and I agree with the above.  Brunetta Genera MD

## 2019-03-16 NOTE — Telephone Encounter (Signed)
Verbal orders to begin hospice  I will call them next week to see how they are doing and whether to restart home visits instead of in the office

## 2019-03-17 ENCOUNTER — Telehealth: Payer: Self-pay | Admitting: Hematology

## 2019-03-17 NOTE — Telephone Encounter (Signed)
No los per 12/23.

## 2019-03-21 ENCOUNTER — Telehealth: Payer: Self-pay | Admitting: *Deleted

## 2019-03-21 LAB — MULTIPLE MYELOMA PANEL, SERUM
Albumin SerPl Elph-Mcnc: 3.2 g/dL (ref 2.9–4.4)
Albumin/Glob SerPl: 0.7 (ref 0.7–1.7)
Alpha 1: 0.3 g/dL (ref 0.0–0.4)
Alpha2 Glob SerPl Elph-Mcnc: 0.9 g/dL (ref 0.4–1.0)
B-Globulin SerPl Elph-Mcnc: 3.4 g/dL — ABNORMAL HIGH (ref 0.7–1.3)
Gamma Glob SerPl Elph-Mcnc: 0.2 g/dL — ABNORMAL LOW (ref 0.4–1.8)
Globulin, Total: 4.7 g/dL — ABNORMAL HIGH (ref 2.2–3.9)
IgA: 2745 mg/dL — ABNORMAL HIGH (ref 61–437)
IgG (Immunoglobin G), Serum: 254 mg/dL — ABNORMAL LOW (ref 603–1613)
IgM (Immunoglobulin M), Srm: 6 mg/dL — ABNORMAL LOW (ref 15–143)
M Protein SerPl Elph-Mcnc: 2.7 g/dL — ABNORMAL HIGH
Total Protein ELP: 7.9 g/dL (ref 6.0–8.5)

## 2019-03-22 ENCOUNTER — Other Ambulatory Visit: Payer: Self-pay

## 2019-03-22 ENCOUNTER — Telehealth: Payer: Self-pay

## 2019-03-22 ENCOUNTER — Other Ambulatory Visit: Payer: Medicare Other | Admitting: Hospice

## 2019-03-22 NOTE — Telephone Encounter (Signed)
Yes, I knew about it. I spoke to his wife and daughter yesterday

## 2019-03-22 NOTE — Telephone Encounter (Signed)
Anderson Malta RN [facility] left v/m notifying Dr Silvio Pate of pts death on 27-Mar-2019 at 10:00AM.

## 2019-03-25 NOTE — Telephone Encounter (Signed)
Notified by daughter Jean-Paul Kosh that patient passed away at home early 04-15-23 under hospice care

## 2019-03-25 DEATH — deceased

## 2019-06-20 ENCOUNTER — Encounter: Payer: Medicare Other | Admitting: Internal Medicine

## 2020-03-25 IMAGING — CT CT HEAD WITHOUT CONTRAST
3 of 6 series · 15 of 47 positions shown, 18 images · non-contrast
Comparison: Brain MRI 04/29/2018.

CLINICAL DATA: Status post fall x2 over the past 3 weeks, most
recently 09/02/2018. Initial encounter.

EXAM:
CT HEAD WITHOUT CONTRAST
TECHNIQUE: Contiguous axial images were obtained from the base of the skull
through the vertex without intravenous contrast.

[Series 2: head wo · axial · 0.47mm/px · z∈[-105,+25]mm · 10 of 30 slices shown, 13 images]
[im 2/30  brain]
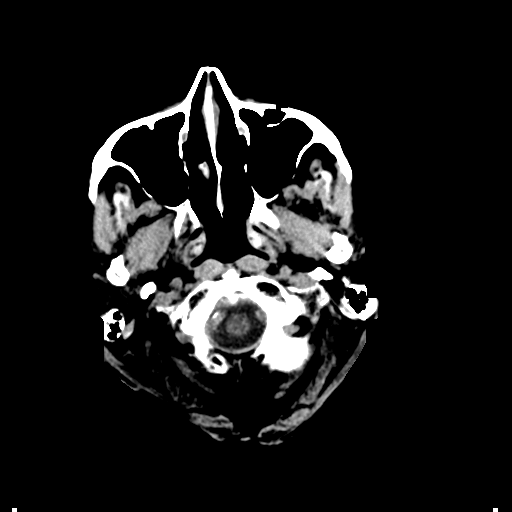
[im 2/30  bone]
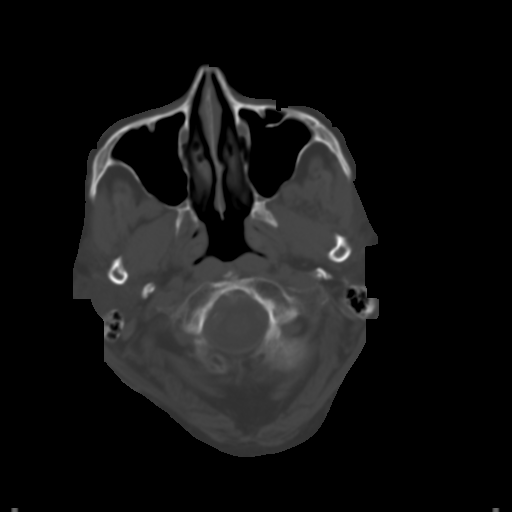
[im 5/30  brain]
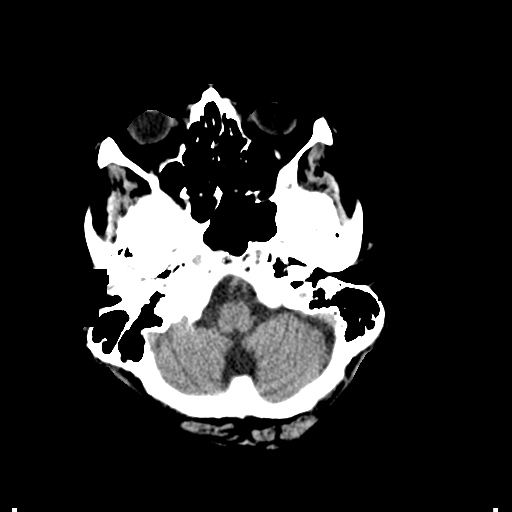
[im 8/30  brain]
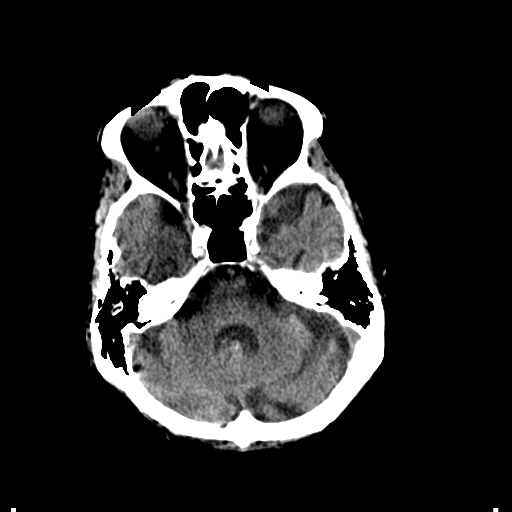
[im 11/30  brain]
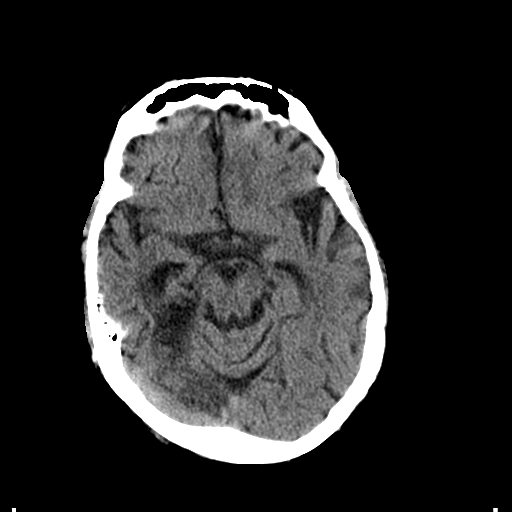
[im 14/30  brain]
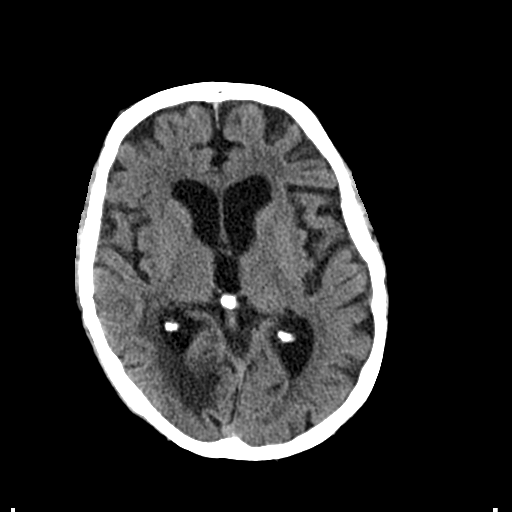
[im 14/30  bone]
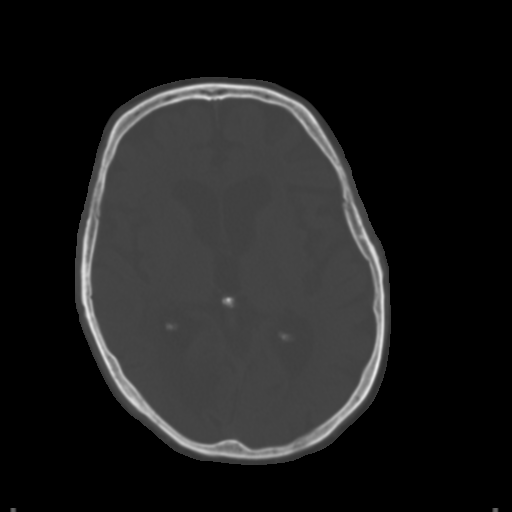
[im 16/30  brain]
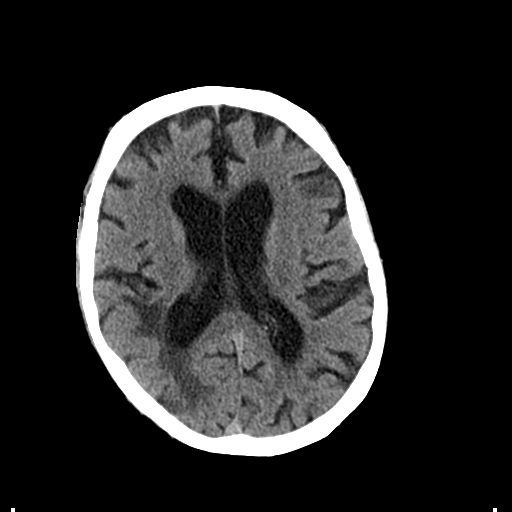
[im 19/30  brain]
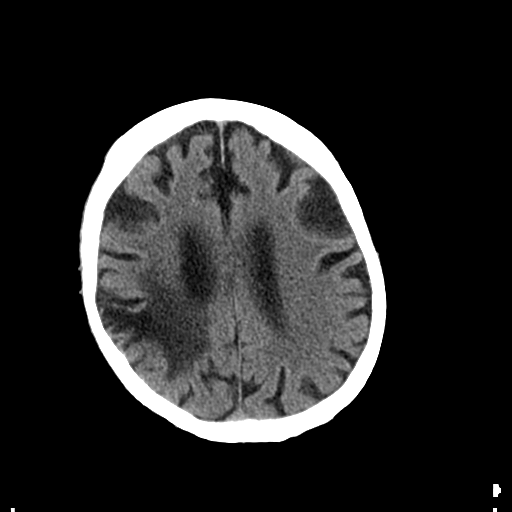
[im 22/30  brain]
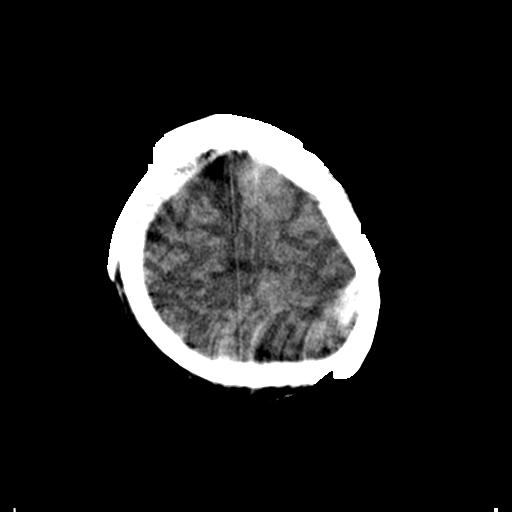
[im 25/30  brain]
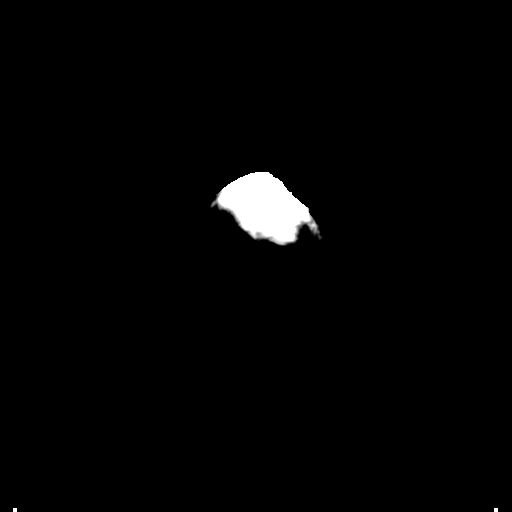
[im 25/30  bone]
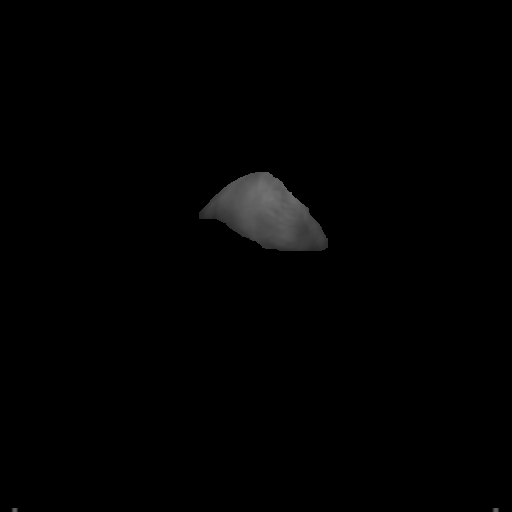
[im 28/30  brain]
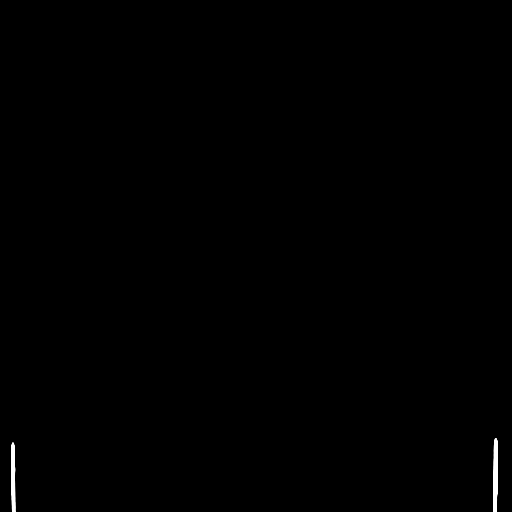

[Series 4: coronal soft tissue · coronal · 0.26mm/px · 3 of 69 slices shown]
[im 18/69  brain]
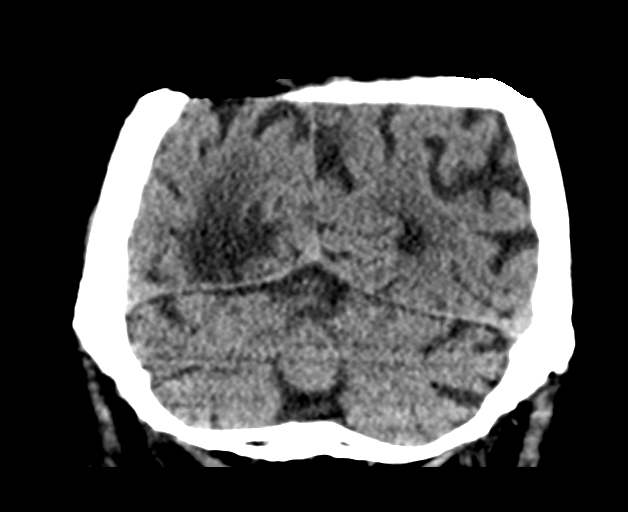
[im 35/69  brain]
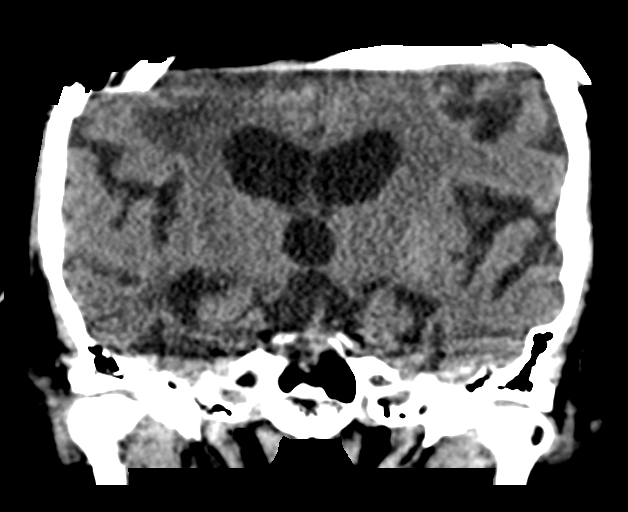
[im 52/69  brain]
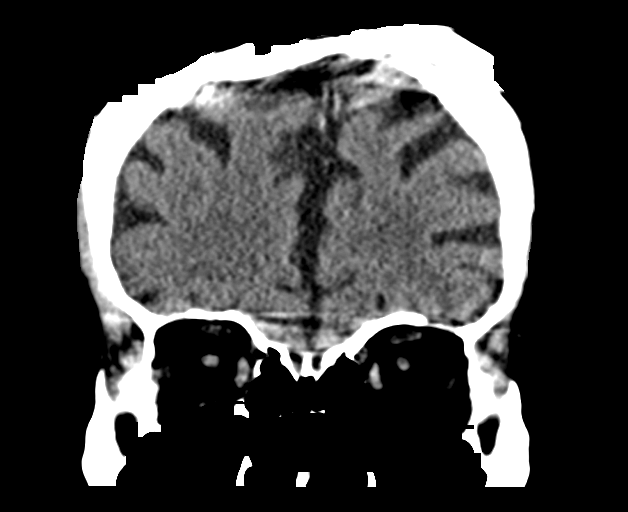

[Series 5: sagittal soft tissue · sagittal · 0.26mm/px · 2 of 59 slices shown]
[im 20/59  brain]
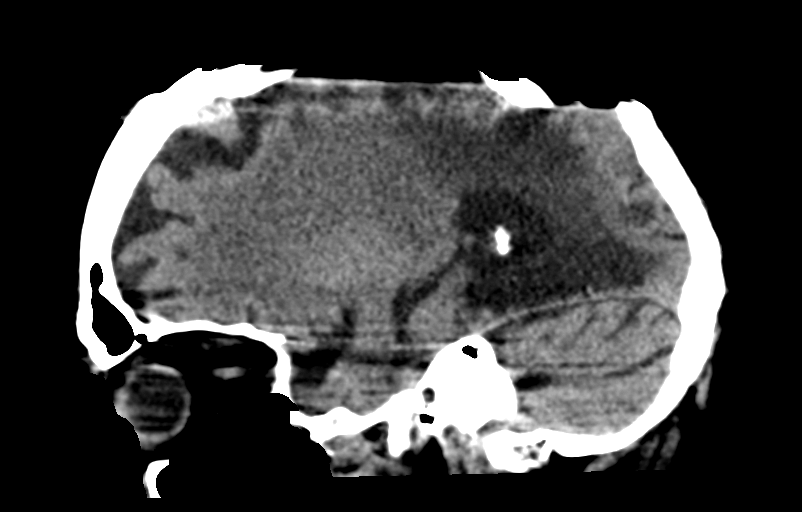
[im 39/59  brain]
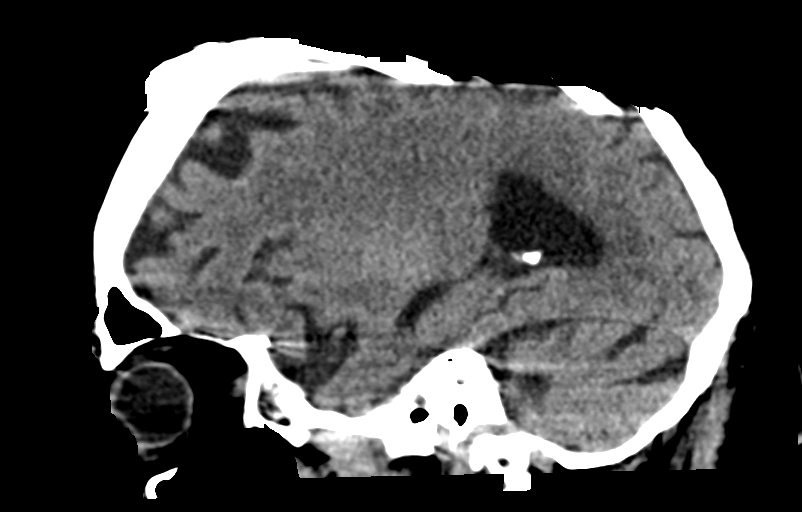

[15 of 47 positions shown; findings below may reference images not displayed]

FINDINGS: Brain: No evidence of acute infarction, hemorrhage, hydrocephalus,
extra-axial collection or mass lesion/mass effect. Atrophy, chronic
microvascular ischemic change and remote right MCA and PCA infarcts
are unchanged in appearance.

Vascular: Atherosclerosis noted.

Skull: No fracture or focal lesion.

Sinuses/Orbits: Negative.

Other: None.
IMPRESSION: No acute abnormality.

Atrophy, chronic microvascular ischemic change and remote right MCA
and PCA territory infarcts.

## 2023-01-05 NOTE — Telephone Encounter (Signed)
error
